# Patient Record
Sex: Female | Born: 1950 | Race: White | Hispanic: No | Smoking: Never smoker
Health system: Southern US, Community
[De-identification: ages and names within clinical notes are randomized; demographics above are authoritative.]

## PROBLEM LIST (undated history)

## (undated) DIAGNOSIS — I10 Essential (primary) hypertension: Secondary | ICD-10-CM

## (undated) DIAGNOSIS — M545 Low back pain, unspecified: Secondary | ICD-10-CM

## (undated) DIAGNOSIS — E119 Type 2 diabetes mellitus without complications: Secondary | ICD-10-CM

## (undated) DIAGNOSIS — I2510 Atherosclerotic heart disease of native coronary artery without angina pectoris: Secondary | ICD-10-CM

## (undated) DIAGNOSIS — K5790 Diverticulosis of intestine, part unspecified, without perforation or abscess without bleeding: Secondary | ICD-10-CM

## (undated) DIAGNOSIS — F329 Major depressive disorder, single episode, unspecified: Secondary | ICD-10-CM

## (undated) DIAGNOSIS — F32A Depression, unspecified: Secondary | ICD-10-CM

## (undated) DIAGNOSIS — K589 Irritable bowel syndrome without diarrhea: Secondary | ICD-10-CM

## (undated) DIAGNOSIS — G8929 Other chronic pain: Secondary | ICD-10-CM

## (undated) DIAGNOSIS — K219 Gastro-esophageal reflux disease without esophagitis: Secondary | ICD-10-CM

## (undated) DIAGNOSIS — E039 Hypothyroidism, unspecified: Secondary | ICD-10-CM

## (undated) DIAGNOSIS — F419 Anxiety disorder, unspecified: Secondary | ICD-10-CM

## (undated) DIAGNOSIS — E785 Hyperlipidemia, unspecified: Secondary | ICD-10-CM

## (undated) DIAGNOSIS — M1990 Unspecified osteoarthritis, unspecified site: Secondary | ICD-10-CM

## (undated) DIAGNOSIS — J45909 Unspecified asthma, uncomplicated: Secondary | ICD-10-CM

## (undated) DIAGNOSIS — R0600 Dyspnea, unspecified: Secondary | ICD-10-CM

## (undated) HISTORY — DX: Gastro-esophageal reflux disease without esophagitis: K21.9

## (undated) HISTORY — DX: Morbid (severe) obesity due to excess calories: E66.01

## (undated) HISTORY — DX: Atherosclerotic heart disease of native coronary artery without angina pectoris: I25.10

## (undated) HISTORY — DX: Hypothyroidism, unspecified: E03.9

## (undated) HISTORY — DX: Unspecified asthma, uncomplicated: J45.909

## (undated) HISTORY — DX: Unspecified osteoarthritis, unspecified site: M19.90

## (undated) HISTORY — PX: CHOLECYSTECTOMY: SHX55

## (undated) HISTORY — PX: CATARACT EXTRACTION: SUR2

## (undated) HISTORY — DX: Irritable bowel syndrome, unspecified: K58.9

## (undated) HISTORY — DX: Major depressive disorder, single episode, unspecified: F32.9

## (undated) HISTORY — DX: Type 2 diabetes mellitus without complications: E11.9

## (undated) HISTORY — DX: Diverticulosis of intestine, part unspecified, without perforation or abscess without bleeding: K57.90

## (undated) HISTORY — DX: Depression, unspecified: F32.A

## (undated) HISTORY — DX: Essential (primary) hypertension: I10

## (undated) HISTORY — DX: Anxiety disorder, unspecified: F41.9

## (undated) HISTORY — DX: Hyperlipidemia, unspecified: E78.5

## (undated) HISTORY — DX: Dyspnea, unspecified: R06.00

---
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
## 1976-09-13 HISTORY — PX: TUBAL LIGATION: SHX77

## 1979-05-15 HISTORY — PX: VAGINAL HYSTERECTOMY: SUR661

## 2000-05-19 ENCOUNTER — Ambulatory Visit: Admission: RE | Admit: 2000-05-19 | Discharge: 2000-05-19 | Payer: Self-pay | Admitting: *Deleted

## 2000-05-20 ENCOUNTER — Ambulatory Visit: Admission: RE | Admit: 2000-05-20 | Discharge: 2000-05-20 | Payer: Self-pay | Admitting: Preventative Medicine

## 2000-05-26 ENCOUNTER — Ambulatory Visit: Admission: RE | Admit: 2000-05-26 | Discharge: 2000-05-26 | Payer: Self-pay | Admitting: Preventative Medicine

## 2001-09-13 HISTORY — PX: CORONARY ARTERY BYPASS GRAFT: SHX141

## 2002-01-22 ENCOUNTER — Encounter: Payer: Self-pay | Admitting: Family Medicine

## 2002-01-22 ENCOUNTER — Inpatient Hospital Stay (HOSPITAL_COMMUNITY): Admission: AD | Admit: 2002-01-22 | Discharge: 2002-01-24 | Payer: Self-pay | Admitting: Family Medicine

## 2002-01-24 ENCOUNTER — Encounter: Payer: Self-pay | Admitting: Cardiology

## 2002-02-16 ENCOUNTER — Ambulatory Visit (HOSPITAL_COMMUNITY): Admission: RE | Admit: 2002-02-16 | Discharge: 2002-02-16 | Payer: Self-pay | Admitting: Family Medicine

## 2002-02-16 ENCOUNTER — Encounter: Payer: Self-pay | Admitting: Family Medicine

## 2002-02-28 ENCOUNTER — Inpatient Hospital Stay (HOSPITAL_COMMUNITY): Admission: AD | Admit: 2002-02-28 | Discharge: 2002-03-09 | Payer: Self-pay | Admitting: Cardiology

## 2002-03-01 ENCOUNTER — Encounter: Payer: Self-pay | Admitting: Thoracic Surgery (Cardiothoracic Vascular Surgery)

## 2002-03-02 ENCOUNTER — Encounter: Payer: Self-pay | Admitting: Thoracic Surgery (Cardiothoracic Vascular Surgery)

## 2002-03-03 ENCOUNTER — Encounter: Payer: Self-pay | Admitting: Thoracic Surgery (Cardiothoracic Vascular Surgery)

## 2002-03-04 ENCOUNTER — Encounter: Payer: Self-pay | Admitting: Thoracic Surgery (Cardiothoracic Vascular Surgery)

## 2002-03-05 ENCOUNTER — Encounter: Payer: Self-pay | Admitting: Thoracic Surgery (Cardiothoracic Vascular Surgery)

## 2002-03-14 ENCOUNTER — Encounter: Payer: Self-pay | Admitting: Cardiology

## 2002-03-14 ENCOUNTER — Ambulatory Visit (HOSPITAL_COMMUNITY): Admission: RE | Admit: 2002-03-14 | Discharge: 2002-03-14 | Payer: Self-pay | Admitting: Cardiology

## 2002-03-29 ENCOUNTER — Encounter (HOSPITAL_COMMUNITY): Admission: RE | Admit: 2002-03-29 | Discharge: 2002-04-11 | Payer: Self-pay | Admitting: Cardiology

## 2002-12-27 ENCOUNTER — Encounter: Payer: Self-pay | Admitting: Family Medicine

## 2002-12-27 ENCOUNTER — Ambulatory Visit (HOSPITAL_COMMUNITY): Admission: RE | Admit: 2002-12-27 | Discharge: 2002-12-27 | Payer: Self-pay | Admitting: Family Medicine

## 2004-07-15 ENCOUNTER — Ambulatory Visit (HOSPITAL_COMMUNITY): Admission: RE | Admit: 2004-07-15 | Discharge: 2004-07-15 | Payer: Self-pay | Admitting: Family Medicine

## 2004-12-11 ENCOUNTER — Ambulatory Visit (HOSPITAL_COMMUNITY): Admission: RE | Admit: 2004-12-11 | Discharge: 2004-12-11 | Payer: Self-pay | Admitting: Family Medicine

## 2006-08-16 ENCOUNTER — Ambulatory Visit: Payer: Self-pay | Admitting: *Deleted

## 2006-08-16 ENCOUNTER — Inpatient Hospital Stay (HOSPITAL_COMMUNITY): Admission: AD | Admit: 2006-08-16 | Discharge: 2006-08-19 | Payer: Self-pay | Admitting: Family Medicine

## 2006-09-20 ENCOUNTER — Ambulatory Visit: Payer: Self-pay | Admitting: Cardiology

## 2007-09-04 ENCOUNTER — Emergency Department (HOSPITAL_COMMUNITY): Admission: EM | Admit: 2007-09-04 | Discharge: 2007-09-04 | Payer: Self-pay | Admitting: Emergency Medicine

## 2008-06-13 HISTORY — PX: ESOPHAGOGASTRODUODENOSCOPY: SHX1529

## 2008-06-13 HISTORY — PX: OTHER SURGICAL HISTORY: SHX169

## 2008-06-14 ENCOUNTER — Ambulatory Visit (HOSPITAL_COMMUNITY): Admission: RE | Admit: 2008-06-14 | Discharge: 2008-06-14 | Payer: Self-pay | Admitting: Family Medicine

## 2008-06-27 ENCOUNTER — Ambulatory Visit: Payer: Self-pay | Admitting: Gastroenterology

## 2008-07-03 ENCOUNTER — Ambulatory Visit (HOSPITAL_COMMUNITY): Admission: RE | Admit: 2008-07-03 | Discharge: 2008-07-03 | Payer: Self-pay | Admitting: Gastroenterology

## 2008-07-03 ENCOUNTER — Encounter: Payer: Self-pay | Admitting: Gastroenterology

## 2008-07-03 ENCOUNTER — Ambulatory Visit: Payer: Self-pay | Admitting: Gastroenterology

## 2008-10-11 ENCOUNTER — Emergency Department (HOSPITAL_COMMUNITY): Admission: EM | Admit: 2008-10-11 | Discharge: 2008-10-11 | Payer: Self-pay | Admitting: Emergency Medicine

## 2008-12-09 ENCOUNTER — Emergency Department (HOSPITAL_COMMUNITY): Admission: EM | Admit: 2008-12-09 | Discharge: 2008-12-09 | Payer: Self-pay | Admitting: Emergency Medicine

## 2010-03-22 ENCOUNTER — Emergency Department (HOSPITAL_COMMUNITY)
Admission: EM | Admit: 2010-03-22 | Discharge: 2010-03-22 | Payer: Self-pay | Source: Home / Self Care | Admitting: Emergency Medicine

## 2010-08-25 ENCOUNTER — Ambulatory Visit (HOSPITAL_COMMUNITY)
Admission: RE | Admit: 2010-08-25 | Discharge: 2010-08-25 | Payer: Self-pay | Source: Home / Self Care | Attending: Ophthalmology | Admitting: Ophthalmology

## 2010-10-04 ENCOUNTER — Encounter: Payer: Self-pay | Admitting: Family Medicine

## 2010-11-23 ENCOUNTER — Observation Stay (HOSPITAL_COMMUNITY)
Admission: EM | Admit: 2010-11-23 | Discharge: 2010-11-26 | Disposition: A | Discharge status: Home / Self Care | Payer: BC Managed Care – PPO | Attending: Internal Medicine | Admitting: Internal Medicine

## 2010-11-23 ENCOUNTER — Emergency Department (HOSPITAL_COMMUNITY): Payer: BC Managed Care – PPO

## 2010-11-23 DIAGNOSIS — IMO0001 Reserved for inherently not codable concepts without codable children: Secondary | ICD-10-CM | POA: Insufficient documentation

## 2010-11-23 DIAGNOSIS — E871 Hypo-osmolality and hyponatremia: Secondary | ICD-10-CM | POA: Insufficient documentation

## 2010-11-23 DIAGNOSIS — D649 Anemia, unspecified: Secondary | ICD-10-CM | POA: Insufficient documentation

## 2010-11-23 DIAGNOSIS — E039 Hypothyroidism, unspecified: Secondary | ICD-10-CM | POA: Insufficient documentation

## 2010-11-23 DIAGNOSIS — J45901 Unspecified asthma with (acute) exacerbation: Principal | ICD-10-CM | POA: Insufficient documentation

## 2010-11-23 DIAGNOSIS — Z794 Long term (current) use of insulin: Secondary | ICD-10-CM | POA: Insufficient documentation

## 2010-11-23 DIAGNOSIS — Z79899 Other long term (current) drug therapy: Secondary | ICD-10-CM | POA: Insufficient documentation

## 2010-11-23 DIAGNOSIS — I2510 Atherosclerotic heart disease of native coronary artery without angina pectoris: Secondary | ICD-10-CM | POA: Insufficient documentation

## 2010-11-23 LAB — DIFFERENTIAL
Basophils Absolute: 0 10*3/uL (ref 0.0–0.1)
Basophils Relative: 1 % (ref 0–1)
Eosinophils Absolute: 0.2 10*3/uL (ref 0.0–0.7)
Eosinophils Relative: 3 % (ref 0–5)
Lymphocytes Relative: 35 % (ref 12–46)
Lymphs Abs: 2 10*3/uL (ref 0.7–4.0)
Monocytes Absolute: 0.7 10*3/uL (ref 0.1–1.0)
Monocytes Relative: 13 % — ABNORMAL HIGH (ref 3–12)
Neutro Abs: 2.7 10*3/uL (ref 1.7–7.7)
Neutrophils Relative %: 48 % (ref 43–77)

## 2010-11-23 LAB — BASIC METABOLIC PANEL
BUN: 11 mg/dL (ref 6–23)
CO2: 25 mEq/L (ref 19–32)
Calcium: 9.3 mg/dL (ref 8.4–10.5)
Chloride: 87 mEq/L — ABNORMAL LOW (ref 96–112)
Creatinine, Ser: 1.1 mg/dL (ref 0.4–1.2)
GFR calc Af Amer: >60 mL/min (ref >60)
GFR calc non Af Amer: 51 mL/min — ABNORMAL LOW (ref >60)
Glucose, Bld: 333 mg/dL — ABNORMAL HIGH (ref 70–99)
Potassium: 4.1 mEq/L (ref 3.5–5.1)
Sodium: 128 mEq/L — ABNORMAL LOW (ref 135–145)

## 2010-11-23 LAB — URINALYSIS, ROUTINE W REFLEX MICROSCOPIC
Bilirubin Urine: NEGATIVE
Glucose, UA: NEGATIVE mg/dL
Hgb urine dipstick: NEGATIVE
Ketones, ur: NEGATIVE mg/dL
Nitrite: NEGATIVE
Protein, ur: NEGATIVE mg/dL
Specific Gravity, Urine: 1.01 (ref 1.005–1.030)
Urobilinogen, UA: 0.2 mg/dL (ref 0.0–1.0)
pH: 5 (ref 5.0–8.0)

## 2010-11-23 LAB — CK TOTAL AND CKMB (NOT AT ARMC)
CK, MB: 0.9 ng/mL (ref 0.3–4.0)
Relative Index: INVALID (ref 0.0–2.5)
Total CK: 67 U/L (ref 7–177)

## 2010-11-23 LAB — CBC
HCT: 39.7 % (ref 36.0–46.0)
Hemoglobin: 13.3 g/dL (ref 12.0–15.0)
MCH: 29.6 pg (ref 26.0–34.0)
MCHC: 33.5 g/dL (ref 30.0–36.0)
MCV: 88.4 fL (ref 78.0–100.0)
Platelets: 216 10*3/uL (ref 150–400)
RBC: 4.49 MIL/uL (ref 3.87–5.11)
RDW: 12.8 % (ref 11.5–15.5)
WBC: 5.7 10*3/uL (ref 4.0–10.5)

## 2010-11-23 LAB — POCT I-STAT 4, (NA,K, GLUC, HGB,HCT)
Glucose, Bld: 64 mg/dL — ABNORMAL LOW (ref 70–99)
HCT: 41 % (ref 36.0–46.0)
Hemoglobin: 13.9 g/dL (ref 12.0–15.0)
Potassium: 4.4 mEq/L (ref 3.5–5.1)
Sodium: 138 mEq/L (ref 135–145)

## 2010-11-23 LAB — GLUCOSE, CAPILLARY
Glucose-Capillary: 279 mg/dL — ABNORMAL HIGH (ref 70–99)
Glucose-Capillary: 150 mg/dL — ABNORMAL HIGH (ref 70–99)
Glucose-Capillary: 118 mg/dL — ABNORMAL HIGH (ref 70–99)

## 2010-11-23 LAB — TROPONIN I: Troponin I: 0.01 ng/mL (ref 0.00–0.06)

## 2010-11-24 LAB — GLUCOSE, CAPILLARY
Glucose-Capillary: 226 mg/dL — ABNORMAL HIGH (ref 70–99)
Glucose-Capillary: 388 mg/dL — ABNORMAL HIGH (ref 70–99)
Glucose-Capillary: 389 mg/dL — ABNORMAL HIGH (ref 70–99)
Glucose-Capillary: 424 mg/dL — ABNORMAL HIGH (ref 70–99)
Glucose-Capillary: 475 mg/dL — ABNORMAL HIGH (ref 70–99)
Glucose-Capillary: 496 mg/dL — ABNORMAL HIGH (ref 70–99)

## 2010-11-24 LAB — TSH: TSH: 1.301 u[IU]/mL (ref 0.350–4.500)

## 2010-11-24 LAB — GLUCOSE, RANDOM: Glucose, Bld: 397 mg/dL — ABNORMAL HIGH (ref 70–99)

## 2010-11-24 LAB — COMPREHENSIVE METABOLIC PANEL
AST: 21 U/L (ref 0–37)
Albumin: 3 g/dL — ABNORMAL LOW (ref 3.5–5.2)
Alkaline Phosphatase: 60 U/L (ref 39–117)
BUN: 15 mg/dL (ref 6–23)
Chloride: 96 mEq/L (ref 96–112)
GFR calc Af Amer: >60 mL/min (ref >60)
Potassium: 3.9 mEq/L (ref 3.5–5.1)
Total Protein: 6.4 g/dL (ref 6.0–8.3)

## 2010-11-24 LAB — CARDIAC PANEL(CRET KIN+CKTOT+MB+TROPI)
Relative Index: INVALID (ref 0.0–2.5)
Relative Index: INVALID (ref 0.0–2.5)
Total CK: 57 U/L (ref 7–177)

## 2010-11-25 LAB — DIFFERENTIAL
Eosinophils Absolute: 0 10*3/uL (ref 0.0–0.7)
Lymphocytes Relative: 13 % (ref 12–46)
Lymphs Abs: 1.5 10*3/uL (ref 0.7–4.0)
Neutro Abs: 9.6 10*3/uL — ABNORMAL HIGH (ref 1.7–7.7)
Neutrophils Relative %: 82 % — ABNORMAL HIGH (ref 43–77)

## 2010-11-25 LAB — GLUCOSE, CAPILLARY: Glucose-Capillary: 329 mg/dL — ABNORMAL HIGH (ref 70–99)

## 2010-11-25 LAB — CBC
HCT: 35.8 % — ABNORMAL LOW (ref 36.0–46.0)
Hemoglobin: 11.9 g/dL — ABNORMAL LOW (ref 12.0–15.0)
MCV: 89.1 fL (ref 78.0–100.0)
Platelets: 216 10*3/uL (ref 150–400)
RBC: 4.02 MIL/uL (ref 3.87–5.11)
WBC: 11.7 10*3/uL — ABNORMAL HIGH (ref 4.0–10.5)

## 2010-11-25 LAB — BASIC METABOLIC PANEL
GFR calc non Af Amer: >60 mL/min (ref >60)
Potassium: 4.4 mEq/L (ref 3.5–5.1)
Sodium: 139 mEq/L (ref 135–145)

## 2010-11-26 LAB — GLUCOSE, CAPILLARY
Glucose-Capillary: 271 mg/dL — ABNORMAL HIGH (ref 70–99)
Glucose-Capillary: 263 mg/dL — ABNORMAL HIGH (ref 70–99)

## 2010-11-26 LAB — BASIC METABOLIC PANEL
CO2: 24 mEq/L (ref 19–32)
Calcium: 9.4 mg/dL (ref 8.4–10.5)
Creatinine, Ser: 0.78 mg/dL (ref 0.4–1.2)
GFR calc Af Amer: >60 mL/min (ref >60)
Glucose, Bld: 229 mg/dL — ABNORMAL HIGH (ref 70–99)

## 2010-11-26 LAB — DIFFERENTIAL
Basophils Absolute: 0 10*3/uL (ref 0.0–0.1)
Basophils Relative: 0 % (ref 0–1)
Eosinophils Absolute: 0 10*3/uL (ref 0.0–0.7)
Eosinophils Relative: 0 % (ref 0–5)
Monocytes Absolute: 0.3 10*3/uL (ref 0.1–1.0)
Neutro Abs: 8.7 10*3/uL — ABNORMAL HIGH (ref 1.7–7.7)

## 2010-11-26 LAB — CBC
MCHC: 33.8 g/dL (ref 30.0–36.0)
Platelets: 216 10*3/uL (ref 150–400)
RDW: 12.7 % (ref 11.5–15.5)

## 2010-11-28 LAB — CULTURE, BLOOD (ROUTINE X 2)

## 2010-11-30 NOTE — Discharge Summary (Signed)
NAME:  [name], [name]               ACCOUNT NO.:  [id number]  MEDICAL RECORD NO.:  [id number]           PATIENT TYPE:  O  LOCATION:  A318                          FACILITY:  [facility]  PHYSICIAN:  Elliot Cousin, M.D.    DATE OF BIRTH:  [birth year]  DATE OF ADMISSION:  11/23/2010 DATE OF DISCHARGE:  03/15/2012LH                              DISCHARGE SUMMARY   DISCHARGE DIAGNOSES: 1. Acute asthmatic bronchitis. 2. Uncontrolled type 2 diabetes mellitus.  The patient's hemoglobin     A1c was 12.1. 3. Hypothyroidism.  The patient's free T4 was within normal limits at     1.21 and her TSH was within normal limits at 1.301. 4. Hyponatremia secondary to hyperglycemia, resolved. 5. Coronary artery disease.  The patient's cardiac enzymes were within     normal limits. 6. Mild dilutional anemia.  The patient's hemoglobin on admission was     13.3 and at the time of hospital discharge, was 11.2.  Further     monitoring and evaluation will be deferred to her primary care     physician.  DISCHARGE MEDICATIONS: 1. Advair Diskus 250/50 one puff b.i.d. 2. Tessalon Perles 1 capsule 3 times daily. 3. Guaifenesin DM 5 mL every 4 hours as needed for cough. 4. Prednisone taper to be taken as directed over the next 5 days. 5. ProAir inhaler 2 puffs 3 times daily scheduled for 4-5 days and     then 3 times daily as needed. 6. Z-Pak to be taken as directed over the next 5 days. 7. Humalog sliding scale 3 times daily.  The patient was given the     instructions of the sliding scale as written for her. 8. Lantus insulin, the dose was increased to 25 units subcu b.i.d. 9. Alprazolam 0.5 mg daily as needed for anxiety. 10.Aspirin 325 mg daily. 11.Glyburide 5 mg b.i.d. 12.Levoxyl 112 mcg daily. 13.Lisinopril 20 mg daily. 14.Metformin 500 mg b.i.d. 15.Vytorin 10/80 mg daily.  DISCHARGE DISPOSITION:  The patient was discharged home in improved and stable condition on November 26, 2010.  She will follow up with  her primary care physician, Dr. Gerda Diss, on Wednesday, December 02, 2010, at 10 o'clock a.m.  CONSULTATIONS:  None.  PROCEDURES PERFORMED:  Chest x-ray on November 23, 2010.  The results revealed no active or acute disease.  HISTORY OF PRESENT ILLNESS:  The patient is a 60 year old woman with a past medical history significant for type 2 diabetes mellitus, morbid obesity, asthma, and coronary artery disease.  She presented to the hospital with a chief complaint of wheezing and cough.  She also complained of elevated and uncontrolled blood sugars at home.  In the emergency department, she was noted to be mildly hypotensive with a blood pressure of 92/51.  Her temperature was 99.1 and her oxygen saturation was 96% on nasal cannula oxygen.  Her chest x-ray revealed no acute cardiopulmonary disease.  Her EKG revealed normal sinus rhythm with nonspecific ST and T-wave abnormalities.  Her lab data were significant for a normal WBC of 5.7, low serum sodium of 128, and elevated blood glucose of 333.  She was admitted  for further evaluation and management.  HOSPITAL COURSE: The patient was started empirically on azithromycin intravenously for what was considered an infectious acute asthmatic bronchitis. Albuterol and Atrovent nebulizers were administered every 6 hours and then every 2 hours as needed.  125 mg of Solu-Medrol was given intravenously once and then she was started on a slow prednisone taper. Oxygen was titrated to keep her oxygen saturations greater than 90%. Her cough was treated with both Robitussin DM and Tessalon Perles.  She was restarted on all of her home medications including glyburide and metformin.  Lantus insulin was titrated up to 25 units subcu twice daily.  Sliding scale NovoLog was titrated up to the resistant scale. Also, she received additional NovoLog for mealtime coverage if she ate 50% or more of her meals, which she did.  Over the course of the hospitalization,  the patient's symptoms subsided. At the time of hospital discharge, she was oxygenating 98% on room air. Although her capillary blood glucose was not optimal, it did improve. Prior to hospital discharge, her capillary blood glucose was ranging from 200 to 250.  Her hemoglobin A1c was noted to be 12.1, indicating poor control.  She had no complaints of chest pain during the hospitalization.  All of her cardiac enzymes were well within normal limits.  She was maintained on Levoxyl.  Her TSH and free T4 were therapeutic.  Prior to hospital discharge, the patient was advised to continue Lantus at 25 units twice daily.  She had previously been treated with 20 units once daily.  Also,  new sliding scale instructions were explained and given to the patient for t.i.d. dosing of Humalog.  She voiced understanding.     Elliot Cousin, M.D.     DF/MEDQ  D:  11/26/2010  T:  11/26/2010  Job:  [id number]  cc:   Donna Bernard, M.D. Fax: [phone]  Electronically Signed by Elliot Cousin M.D. on 11/30/2010 09:17:52 AM

## 2010-12-06 NOTE — H&P (Signed)
NAME:  [name], [name]               ACCOUNT NO.:  [id number]  MEDICAL RECORD NO.:  [id number]           PATIENT TYPE:  I  LOCATION:  A318                          FACILITY:  [facility]  PHYSICIAN:  Tarry Kos, MD       DATE OF BIRTH:  05-06-1951  DATE OF ADMISSION:  11/23/2010 DATE OF DISCHARGE:  LH                             HISTORY & PHYSICAL   CHIEF COMPLAINT:  Wheezing and cough.  HISTORY OF PRESENT ILLNESS:  [name] is a 60 year old female who has a history of insulin-dependent diabetes, morbid obesity, asthma, coronary artery disease, status post CABG who presents to the emergency department from her primary care physician's office Dr. Gerda Diss because of uncontrolled diabetes and the need to start steroids.  Apparently, [name] approximately 3 weeks ago presented to the ED in [place] and was treated with a course of Levaquin.  She was not given any bronchodilators at that time.  She completed a course of Levaquin, and she stated she has not gotten over this cough and wheezing that she has had.  She is not aware of her diagnosis of asthma, however, in her past medical history here she was hospitalized here in 2008 with a diagnosis of asthma at that time.  She says every time she gets sick, her sugars go out of control.  Her sugar here in the emergency department is over 400, and she has not recently been on any steroids.  She says she did have a temperature 2-3 days ago over 100.  She denies any chest pain. Denies any lower extremity edema.  We have been asked to admit the patient to monitor her sugars while starting steroids.  She has not been given any insulin or steroids yet in the emergency department.  Review of system is otherwise negative.  PAST MEDICAL HISTORY: 1. Asthma again with hospitalization in January 2008. 2. She has significant history of coronary artery disease status post     CABG in 2003. 3. Insulin-dependent diabetes that is uncontrolled. 4. Morbid  obesity. 5. Hyperlipidemia. 6. Hypothyroidism. 7. Diverticulosis. 8. Status post hysterectomy, status post shoulder surgery, status post     cholecystectomy.  FAMILY HISTORY:  Nonsignificant.  SOCIAL HISTORY:  She is denies tobacco use and no alcohol.  No IV drug abuse.  She denies asthma as a child and again is not aware that she has a diagnosis of asthma, but she said says she has wheezed off and on many times throughout her adult life.  MEDICATIONS:  Lantus 20 units subcu daily, sliding scale insulin with each meal, metformin 500 mg twice a day, glyburide 5 mg twice a day, simvastatin 20 mg daily, aspirin 325 mg daily, alprazolam orally.  PHYSICAL EXAMINATION:  VITAL SIGNS/GENERAL:  Her temperature is 99.1 in the emergency department, respiratory rate is 24, pulse 101, and blood pressure 92/51.  She is alert and oriented in no apparent distress.  Her O2 sat initially 97% on room air.  She currently 97% on 2 liters nasal cannula. HEENT:  Extraocular movements are intact.  Pupils equal, reactive to light.  Oropharynx clear.  Mucous membranes  moist. NECK:  No JVD.  No carotid bruits. HEART:  Regular rate and rhythm without murmurs or gallops. CHEST:  She has gotten some mild expiratory wheezing bilaterally.  No rhonchi, rales, or crackles. ABDOMEN:  Soft, nontender, nondistended.  Positive bowel sounds.  No hepatosplenomegaly.  EXTREMITIES:  No clubbing, cyanosis, or edema. PSYCHIATRIC:  Normal affect.  No focal neurologic deficits. SKIN:  No rashes.  LABORATORY DATA:  Chest x-ray is negative for pneumonia.  White count is normal.  Hemoglobin is normal.  Glucose is 337.  BMP sodium is low at 128, creatinine is 1.1.  Urinalysis is negative.  ASSESSMENT AND PLAN:  This is a 60 year old female with acute asthma exacerbation. 1. Acute asthma exacerbation.  This is likely viral.  I am going to     cover her with azithromycin, but if she does spike a temperature, I     would  broaden her antibiotics, obtain blood cultures for now, place     her on Solu-Medrol 125 mg IV once and then switch her prednisone 30     mg twice a day with food.  Her chest x-ray is negative for any     infiltrate. 2. Mild dehydration.  Placed her on IV fluids overnight. 3. Insulin dependent diabetes which is currently uncontrolled.     Continue her on Lantus and put her on a sliding scale insulin and     adjust that as needed, try to quickly taper her off of prednisone     if we can. 4. History of coronary artery disease, status post CABG we are going     to serial her cardiac enzymes. 5. The patient is full code.  Further recommendations depending on     overall hospital course.                                           ______________________________ Tarry Kos, MD     RD/MEDQ  D:  11/23/2010  T:  11/24/2010  Job:  [id number]  Electronically Signed by Eldridge Dace MD on 12/06/2010 01:49:43 PM

## 2010-12-24 LAB — URINALYSIS, ROUTINE W REFLEX MICROSCOPIC
Glucose, UA: 500 mg/dL — AB
Hgb urine dipstick: NEGATIVE
Ketones, ur: NEGATIVE mg/dL
Protein, ur: NEGATIVE mg/dL

## 2010-12-28 LAB — CBC
Hemoglobin: 13.4 g/dL (ref 12.0–15.0)
MCHC: 33.9 g/dL (ref 30.0–36.0)
MCV: 89.6 fL (ref 78.0–100.0)
RBC: 4.39 MIL/uL (ref 3.87–5.11)
WBC: 9.4 10*3/uL (ref 4.0–10.5)

## 2010-12-28 LAB — BASIC METABOLIC PANEL
CO2: 28 mEq/L (ref 19–32)
Calcium: 9.8 mg/dL (ref 8.4–10.5)
Chloride: 99 mEq/L (ref 96–112)
GFR calc Af Amer: >60 mL/min (ref >60)
Sodium: 138 mEq/L (ref 135–145)

## 2010-12-28 LAB — DIFFERENTIAL
Basophils Relative: 0 % (ref 0–1)
Lymphs Abs: 2 10*3/uL (ref 0.7–4.0)
Monocytes Absolute: 0.5 10*3/uL (ref 0.1–1.0)
Monocytes Relative: 5 % (ref 3–12)
Neutro Abs: 6.3 10*3/uL (ref 1.7–7.7)

## 2010-12-28 LAB — GLUCOSE, CAPILLARY

## 2010-12-31 ENCOUNTER — Emergency Department (HOSPITAL_COMMUNITY): Payer: BC Managed Care – PPO

## 2010-12-31 ENCOUNTER — Emergency Department (HOSPITAL_COMMUNITY)
Admission: EM | Admit: 2010-12-31 | Discharge: 2010-12-31 | Disposition: A | Discharge status: Home / Self Care | Payer: BC Managed Care – PPO | Attending: Emergency Medicine | Admitting: Emergency Medicine

## 2010-12-31 DIAGNOSIS — E7800 Pure hypercholesterolemia, unspecified: Secondary | ICD-10-CM | POA: Insufficient documentation

## 2010-12-31 DIAGNOSIS — Z794 Long term (current) use of insulin: Secondary | ICD-10-CM | POA: Insufficient documentation

## 2010-12-31 DIAGNOSIS — Z79899 Other long term (current) drug therapy: Secondary | ICD-10-CM | POA: Insufficient documentation

## 2010-12-31 DIAGNOSIS — IMO0002 Reserved for concepts with insufficient information to code with codable children: Secondary | ICD-10-CM | POA: Insufficient documentation

## 2010-12-31 DIAGNOSIS — I10 Essential (primary) hypertension: Secondary | ICD-10-CM | POA: Insufficient documentation

## 2010-12-31 DIAGNOSIS — I252 Old myocardial infarction: Secondary | ICD-10-CM | POA: Insufficient documentation

## 2010-12-31 DIAGNOSIS — Y998 Other external cause status: Secondary | ICD-10-CM | POA: Insufficient documentation

## 2010-12-31 DIAGNOSIS — Z951 Presence of aortocoronary bypass graft: Secondary | ICD-10-CM | POA: Insufficient documentation

## 2010-12-31 DIAGNOSIS — E119 Type 2 diabetes mellitus without complications: Secondary | ICD-10-CM | POA: Insufficient documentation

## 2011-01-26 NOTE — Consult Note (Signed)
NAME:  [name], [name]               ACCOUNT NO.:  [id number]   MEDICAL RECORD NO.:  [id number]          PATIENT TYPE:  AMB   LOCATION:  DAY                           FACILITY:  [facility]   PHYSICIAN:  Kassie Mends, M.D.      DATE OF BIRTH:  [birth year]   DATE OF CONSULTATION:  06/27/2008  DATE OF DISCHARGE:                                 CONSULTATION   REFERRING PHYSICIAN:  Donna Bernard, MD   REASON FOR CONSULTATION:  Abdominal pain, diarrhea.   HISTORY OF PRESENT ILLNESS:  The patient is a pleasant 60 year old  morbidly obese Caucasian female who presents today for further  evaluation of the above-stated symptoms at the request of Dr. Gerda Diss.  She states that she has had diarrhea for over 1-year duration.  It used  to be more sporadic.  For the past several months, she has had it on a  regular basis, and in the last 2 weeks, it has been severe.  Currently,  she is having more than 5, but less than 10 stools a day.  She has  postprandial stools almost immediately.  She complains of nocturnal  diarrhea as well.  She never has solid stools.  She denies any blood in  the stool or melena.  She has a lot of nausea associated with this.  She  also has postprandial nausea without vomiting.  Denies any weight loss.  No abdominal pain outside of cramping with urge for bowel movement.  Denies any dysphagia, odynophagia, or indigestion.  She has never had a  colonoscopy or EGD.  She denies any history of IBS.  She had a CT of  abdomen and pelvis on June 14, 2008, which revealed a 3- x 2-cm  rounded filling defect in the fundal region of the stomach, felt to be a  bezoar versus a large gastric polyp or other mass.  She had mild  diverticulosis of the sigmoid colon.  Status post hysterectomy.  Bilateral hip joint degenerative changes and asymmetric SI joint  degenerative changes on the right side.   LABORATORY DATA:  Include BUN and creatinine normal.  TSH normal.  LFTs  normal.  She reports  negative stool studies, though we do not have those  records.   CURRENT MEDICATIONS:  1. Vytorin 10/80 mg daily.  2. Lasix 80 mg daily.  3. Lisinopril 10 mg daily.  4. Synthroid 0.1 mg daily.  5. Lantus 30 units b.i.d.  6. Lomotil 6-10 a day.  7. Travatan eye drops nightly.  8. Metformin 500 mg b.i.d.  9. Klor-Con 20 mEq daily.  10.Glyburide 5 mg b.i.d.  11.Metoprolol ER 25 mg daily.  12.Humalog sliding scale daily.  13.Xanax 0.5 mg p.r.n.  14.Aspirin 325 mg daily.   ALLERGIES:  No known drug allergies.   PAST MEDICAL HISTORY:  Diabetes mellitus, hypothyroidism, glaucoma,  hypertension, hypercholesterolemia, depression, anxiety, CAD status post  5-vessel bypass in 2003.  She has had a partial hysterectomy and  cholecystectomy in the 1990s, and bilateral tubal ligation in 1978.   FAMILY HISTORY:  Mother deceased at age 30 with cancer of  unknown  primary, possibly pancreatic, but she is not sure.  Father died in the  5s, had diabetes mellitus and possible MI.   SOCIAL HISTORY:  She is married, has 2 children.  She is employed with  [employer] in [place].  She has never been a smoker.  No alcohol use.   REVIEW OF SYSTEMS:  GI:  See HPI.  CONSTITUTIONAL:  No weight loss.  CARDIOPULMONARY:  No chest pain, shortness of breath, palpitations, or  cough.  GENITOURINARY:  No dysuria or hematuria.   PHYSICAL EXAMINATION:  VITAL SIGNS:  Weight 290 pounds, height 5 feet 4-  1/2 inches, temp 97.8, blood pressure 102/70, and pulse 64.  GENERAL:  Pleasant and morbidly obese Caucasian female in no acute  distress.  SKIN:  Warm and dry.  No jaundice.  HEENT:  Sclera nonicteric.  Oropharyngeal mucosa moist and pink.  No  lymphadenopathy or thyromegaly.  CHEST:  Lungs are clear to auscultation.  CARDIAC:  Regular rate and rhythm.  Normal S1 and S2.  No murmurs, rubs,  or gallops.  ABDOMEN:  Positive bowel sounds.  Abdomen is massively obese, soft, and  nontender.  No  organomegaly or masses appreciated, but limited to body  habitus.  No abdominal bruits or hernias.  LOWER EXTREMITIES:  No edema.   IMPRESSION:  [name] is a 60 year old lady with a 1-year history of  chronic diarrhea, which has been more severe over the last several  weeks.  This has debilitated her.  She has been out of work.  She is  having nocturnal symptoms as well.  She is a diabetic.  Her sugars have  been out of control with a hemoglobin A1c of over 9.  Not mentioned  above, she has no ill contacts, no travel abroad.  She does not notice  any particular food that worsen her symptoms.  Differential diagnoses at  this point in time include microscopic colitis, celiac disease, IBS, and  IBD.  She also has abnormal CT with either a bezoar or a large gastric  polyp versus mass in the stomach.  She needs to have a fundoscopy as  well.  Recommended esophagogastroduodenoscopy with possible small bowel  biopsy for celiac disease and colonoscopy plus or minus random biopsies  based on findings.  I have discussed the risks, alternatives, and  benefits with regards to, but not limited to the risks of reaction,  medication, bleeding, infection, and perforation, and she is agreeable  to proceed.   PLAN:  1. Colonoscopy with possible random biopsies, EGD plus or minus small      bowel biopsies with Dr. Cira Servant in the near future.  2. CBC and celiac antibody panel #3.  We will keep her out of work      until after the procedure.  Out-of-work note provided.  3. I would like to thank Dr. Simone Curia for allowing me to take      part in the care of this patient.      Tana Coast, P.A.      Kassie Mends, M.D.  Electronically Signed    LL/MEDQ  D:  06/27/2008  T:  06/28/2008  Job:  [id number]   cc:   Kassie Mends, M.D.  [address]  [place] , [place] [address]   W. Simone Curia, M.D.  Fax: [phone]

## 2011-01-26 NOTE — Op Note (Signed)
NAME:  [name], [name]               ACCOUNT NO.:  [id number]   MEDICAL RECORD NO.:  [id number]          PATIENT TYPE:  AMB   LOCATION:  DAY                           FACILITY:  [facility]   PHYSICIAN:  Kassie Mends, M.D.      DATE OF BIRTH:  1951/06/25   DATE OF PROCEDURE:  07/03/2008  DATE OF DISCHARGE:                               OPERATIVE REPORT   REFERRING PHYSICIAN:  Donna Bernard, MD   PROCEDURE:  1. Ileocolonoscopy with random cold forceps biopsy.  2. Esophagogastroduodenoscopy with cold forceps biopsy of the gastric      and the duodenal mucosa.   INDICATION FOR EXAM:  [name] is a 60 year old female who has had  diarrhea for over a year.  She reports it was off and on, but over the  past several month, it has been on regular basis.  She denies any  abdominal pain.  She has a significant past medical history of  cholecystectomy and diabetes as well as hypothyroidism.   FINDINGS:  1. Normal terminal ileum, approximately 5-cm visualized.  2. Rare sigmoid colon diverticula.  Otherwise, no polyps, masses,      inflammatory changes, or AVMs seen.  Random biopsies were obtained      via cold forceps to evaluate for microscopic colitis.  3. Normal retroflexed view of the rectum.  4. Normal esophagus without evidence of Barrett mass, erosion,      ulceration, or stricture.  5. Mild hypertrophy of the gastric mucosa seen in the body with mild      erythema seen in the antrum.  Biopsies were obtained via cold      forceps to evaluate for H. pylori gastritis.  A 6-mm x 1-cm gastric      nodules seen in the mid body.  It was pillow texture to it.      Biopsies were obtained.  Tunnel biopsies were obtained via cold      forceps.  6. Normal duodenal bulb.  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  One additional duodenal diverticula was      appreciated in the second portion of the duodenum.  Biopsies were      obtained via cold forceps to evaluate  for celiac sprue.   DIAGNOSES:  1. No obvious source for [name] diarrhea identified.  The      differential diagnosis includes irritable bowel syndrome diarrhea      predominant, celiac sprue, microscopic colitis, bile salt-induced      diarrhea, small bowel bacterial overgrowth, diabetic enteropathy,      and a low likelihood of diarrhea secondary to metformin or Klor-      Con.   RECOMMENDATIONS:  1. No aspirin until July 11, 2008.  No NSAIDs or anticoagulation      for 7 days.  2. Will call her with results of her biopsies.  3. She should follow high-fiber, low-fat, lactose-free diabetic diet.      She is given a handout on a low-fat, lactose-free high-fiber diet  as well as diverticulosis and gastritis.  4. Screening colonoscopy in 10 years.   MEDICATIONS:  1. Demerol 100 mg IV.  2. Versed 7 mg IV.   PROCEDURE TECHNIQUE:  Physical exam was performed.  Informed consent was  obtained.  The patient was explained the benefits, risks, and  alternatives to the procedure.  The patient was connected to the monitor  and placed in left lateral position.  Continuous oxygen was provided by  nasal cannula, IV medicine administered through an indwelling cannula.  After administration of sedation and rectal exam, the patient's rectum  was intubated and the scope was advanced under direct visualization to  the distal terminal ileum.  The scope was removed slowly by carefully  examining the color, texture, anatomy, and integrity mucosa on the way  out.   After the colonoscopy, the patient's esophagus was intubated with  diagnostic gastroscope.  The scope was advanced under direct  visualization to the second portion of the duodenum.  The scope was  removed slowly by carefully examining the color, texture, anatomy, and  integrity mucosa on the way out.  The patient was recovered in endoscopy  and discharged home in satisfactory condition.   PATH:  Benign colon. Chronic  gastritis.n Nl duodenum.      Kassie Mends, M.D.  Electronically Signed     SM/MEDQ  D:  07/03/2008  T:  07/03/2008  Job:  [id number]   cc:   Donna Bernard, M.D.  Fax: [phone]

## 2011-01-29 NOTE — Procedures (Signed)
NAME:  [name], [name]               ACCOUNT NO.:  [id number]   MEDICAL RECORD NO.:  [id number]          PATIENT TYPE:  INP   LOCATION:  A214                          FACILITY:  [facility]   PHYSICIAN:  Edward L. Juanetta Gosling, M.D.DATE OF BIRTH:  Dec 19, 1950   DATE OF PROCEDURE:  08/16/2006  DATE OF DISCHARGE:                              EKG INTERPRETATION   The rhythm is sinus rhythm with a rate in the 80s.  ST-T wave changes  are seen which are diffuse but nonspecific but could indicate ischemia  and clinical correlation is suggested.  Abnormal electrocardiogram.      Oneal Deputy. Juanetta Gosling, M.D.  Electronically Signed     ELH/MEDQ  D:  08/17/2006  T:  08/18/2006  Job:  [id number]

## 2011-01-29 NOTE — Consult Note (Signed)
NAME:  [name], [name]               ACCOUNT NO.:  [id number]   MEDICAL RECORD NO.:  [id number]          PATIENT TYPE:  INP   LOCATION:  A214                          FACILITY:  [facility]   PHYSICIAN:  E. Graceann Congress, MD, FACCDATE OF BIRTH:  03/28/1951   DATE OF CONSULTATION:  08/16/2006  DATE OF DISCHARGE:                                 CONSULTATION   The patient is a very pleasant 60 year old obese white married female, 4  yours post coronary artery bypass graft surgery by Dr. Tressie Stalker,  now presenting with a several-day history of shortness of breath, cough,  and chest discomfort.  The patient has a history of diabetes,  hyperlipidemia.   At the time of catheterization in 2003, she was found to have 3-vessel  coronary artery disease, and CABG included anastomosis of the left  internal mammary artery to the distal anterior descending, saphenous  vein graft to the second diagonal branch, saphenous vein graft to the  first diagonal branch with sequential grafting to the circumflex  marginal and saphenous vein graft to the posterior descending.  The  patient got along well and had no recurrent symptoms since that time and  works full time.   She states that over the weekend she noticed some pressure-type  symptoms, shortness of breath, minimal cough.   She was hospitalized yesterday by Dr. Gerda Diss and is much improved today  with no recurrent symptoms. Serial enzymes were negative.  She was given  some IV steroids and Levaquin and feels fine this morning.  On  admission, she did have bilateral expiratory wheezes and tachypnea.   MEDICATIONS ON ADMISSION:  1. Glyburide 10 mg b.i.d.  2. Zocor 80 mg.  3. Temazepam 15 mg nightly p.r.n.  4. Glucophage 1 g daily.  5. Toprol XL 25 mg daily.  6. Aspirin 81 mg.  7. Lasix 40 mg, recently increased to 80.  8. KCl 20.   PAST SURGICAL HISTORY:  1. Remote shoulder surgery.  2. Hysterectomy.  3. Cholecystectomy.  4. CABG.   PAST  MEDICAL HISTORY:  1. Diverticulosis.  2. Hyperlipidemia.  3. Hypothyroidism.  4. Diabetes.  5. The patient has had some chronic low back pain.  6. GERD.   FAMILY HISTORY:  Should note that her husband had a heart attack in 1985  and flown [place] [facility] [place] and treated emergently at  that time and has continued to do well, though he does have an ICD.   In addition, father died of diabetes.  She has no siblings.   REVIEW OF SYSTEMS:  HEAD, EYES, EARS, NOSE, AND THROAT:  Unremarkable.  CARDIORESPIRATORY: As noted in History of Present Illness.  GI: No  diarrhea, constipation, or bleeding. GU: Negative.  MUSCULOSKELETAL:  Negative.  All other systems unremarkable.   PHYSICAL EXAMINATION:  VITAL SIGNS: Blood pressure 130/80, pulse normal  sinus rhythm, respirations normal.  Weight 300.  HEAD, EYES, EARS, NOSE, AND THROAT:  Unremarkable.  NECK: Supple.  Carotid pulses palpable and equal without bruits. JVP not  elevated.  LUNGS:  Revealed no wheezes or rhonchi today.  CARDIAC: No murmur, gallop, or rub.  ABDOMEN: Obese.  No tenderness, no masses.  EXTREMITIES: No edema.  Pulses palpable bilaterally.  NEUROLOGIC: Unremarkable.   EKG reveals nonspecific ST-T abnormalities in the inferior and anterior  leads consistent with ischemia.  The EKG of December 5 shows somewhat  slightly more ST depression than on admission.   IMPRESSION:  1. Three-vessel coronary artery disease, now 4 years post coronary      artery bypass grafting with chest discomfort, shortness of breath,      possibly related to coronary artery disease.  2. Probable acute bronchitis.  3. Hyperlipidemia on therapy.  4. Hypertension on therapy.  5. Diabetes.  6. History of diverticulosis.  7. Hypothyroidism.   Although her symptoms may be entirely related to acute bronchitis,  because of the known coronary artery disease, abnormal EKG, and fact  that she has some chest tightness, I would suggest  stress Myoview.  I do  not have old EKGs to compare, and probably this should be done before  discharge.      Cecil Cranker, MD, [facility]  Electronically Signed    EJL/MEDQ  D:  08/17/2006  T:  08/17/2006  Job:  [id number]   cc:   Donna Bernard, M.D.  Fax: [phone]

## 2011-01-29 NOTE — Discharge Summary (Signed)
[facility]  Patient:    [name], PHARR Visit Number: [id number] MRN: [id number]          Service Type: OUT Location: RAD Attending Physician:  Harlow Asa Dictated by:   Donna Bernard, M.D. Admit Date:  02/16/2002 Discharge Date: 02/16/2002                             Discharge Summary  FINAL DIAGNOSES: 1. Chest pain with potential unstable angina. 2. Type 2 diabetes. 3. Hypothyroidism. 4. Hypercholesterolemia. 5. Morbid obesity. 6. Chronic back pain with acute flare.  FINAL DISPOSITION:  Patient transferred to [facility] for catheterization and further management.  INITIAL HISTORY AND PHYSICAL:  Please see H&P as dictated.  HOSPITAL COURSE:  This patient is a 60 year old white female with a history of longstanding type 2 diabetes, hypercholesterolemia, hypothyroidism and obesity, who presented to the office the day of admission with complaints of significant chest pain.  The chest pain was substernal radiating to back.  She did notice a sense of shortness of breath.  There was no significant diaphoresis or nausea.  Based on her many risk factors, she was admitted to the hospital and we started her on subcutaneous Lovenox to help manage unstable angina.  Telemetry was utilized.  Serial EKGs were done.  Serial cardiac enzymes were performed and these were negative.  The patient maintained on her usual chronic medication.  [facility] folks were consulted; they recommended ______ and doing a cardiac catheterization, based on her presentation.  The patients cardiac enzymes remained negative.  Her cardiopulmonary exam was negative.  On the day of discharge, she was transferred to [facility] for further management, as noted above. Dictated by:   Donna Bernard, M.D. Attending Physician:  Harlow Asa DD:  02/21/02 TD:  02/23/02 Job: [id number]
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
## 2011-01-29 NOTE — Discharge Summary (Signed)
[facility]  Patient:    [name], [name] Visit Number: [id number] MRN: [id number]          Service Type: MED Location: 4700 4739 01 Attending Physician:  Mirian Mo Dictated by:   Rozell Searing, P.A. Admit Date:  01/23/2002 Discharge Date: 01/24/2002   CC:         [name], M.D.   Referring Physician Discharge [facility]  PROCEDUREs: 1. Coronary angiogram, Jan 23, 2002. 2. Adenosine Cardiolite, Jan 24, 2002  REASON FOR ADMISSION:  Ms. [name] is a 60 year old female, with no prior history of heart disease, but with multiple risk factors for ischemic heart disease, who was initially referred to [facility] for evaluation of chest discomfort.  She was seen in consultation, noted to have negative enzymes but presentation worrisome for unstable angina pectoris and subsequently referred to [facility] for diagnostic coronary angiography.  Please refer to dictated admission note for full details.  LABORATORY DATA:  Normal CBC.  Normal complete metabolic profile.  Pro time 15, INR 1.4.  CPK/MB negative x 2; troponin I less than 0.01 (x 2).  Chest x-ray at [facility], Jan 22, 2002:  No infiltrate or CHF.  HOSPITAL COURSE:  Following transfer from [facility], the patient was maintained on antianginal therapy consisting of aspirin, beta blocker, nitroglycerin paste, and Lovenox.  Follow-up cardiac enzymes were within normal limits.  The patient was initially referred for coronary angiography.  However, both femoral arteries could not be accessed and patient was unwilling to proceed via the brachial artery due to significant lower back pain.  The patient was apprised of the risks of doing so, but she was quite adamant in not proceeding any further.  She was thus referred for adenosine Cardiolite scan the following morning.  Perfusion images were initially felt to be equivocal with preserved ejection fraction (56%).   The area in question was the inferior wall, and this was due to bowel interference; however, following review of the images by Dr. Eden Emms in conjunction with Dr. Fredia Sorrow, this territory was felt to represent diaphragmatic attenuation, and there was no evidence of wall motion abnormality - ejection fraction was confirmed.  Dr. Eden Emms concluded that this represented a low-risk study and that patient was cleared for discharge.  Recommendation was to continue the current medication regimen.  Of note, Dr. Eden Emms recommended that if patient presents with recurrent pain, consideration would need to be given to performing coronary angiography via the brachial artery.  DISCHARGE MEDICATIONS: 1. Synthroid 0.1 mg q.d. 2. Avandia 8 mg q.d. 3. Glyburide 5 mg b.i.d. 4. Zocor 20 mg q.h.s. 5. Baby aspirin 81 mg q.d. 6. Protonix 40 mg q.d. 7. Toprol XL 50 mg q.d. 8. Imdur 30 mg q.d. 9. Nitrostat 0.4 mg p.r.n.  INSTRUCTIONS: 1. Return to baseline level of activity as tolerated. 2. Maintain low fat/cholesterol and diabetic diet.  FOLLOW-UP: 1. The patient is instructed to call and schedule a follow up appointment with Dr. Juanito Doom at the [facility] in approximately two weeks. 2. She will return to Dr. Lubertha South as previously scheduled.  DISCHARGE DIAGNOSES: 1. Nonischemic chest pain.    a. Normal serial cardiac enzymes.    b. Normal adenosine Cardiolite; ejection fraction 56%. 2. Multiple cardiac risk factors.    a. Type 2 diabetes mellitus.    b. Dyslipidemia. 3. Morbid obesity. 4. Chronic back pain. 5. Treated hypothyroidism. 6. History of gastroesophageal reflux disease. Dictated by:  Gene Serpe, P.A. Attending Physician:  Mirian Mo DD:  01/24/02 TD:  01/24/02 Job: [id number]

## 2011-01-29 NOTE — Op Note (Signed)
[place]. [facility]  Patient:    [name], [name] Visit Number: [id number] MRN: [id number]          Service Type: SUR Location: 2000 2041 01 Attending Physician:  Tressie Stalker Dictated by:   Salvatore Decent. Cornelius Moras, M.D. Proc. Date: 03/02/02 Admit Date:  02/28/2002   CC:         Thomas C. Daleen Squibb, M.D. [facility]  Maisie Fus D. Riley Kill, M.D. [facility]  [name], M.D.   Operative Report  PREOPERATIVE DIAGNOSIS:  Three-vessel coronary artery disease with class 4 unstable angina.  POSTOPERATIVE DIAGNOSIS:  Three-vessel coronary artery disease with class 4 unstable angina.  OPERATION PERFORMED:  Median sternotomy for coronary artery bypass grafting times five (left internal mammary artery to distal left anterior descending coronary artery, saphenous vein graft to second diagonal branch, saphenous vein graft to first diagonal branch and sequential saphenous vein graft to circumflex marginal branch, saphenous vein graft to posterior descending coronary artery)  SURGEON:  Salvatore Decent. Cornelius Moras, M.D.  ASSISTANT:  Lissa Merlin, P.A.  ANESTHESIA:  General.  INDICATIONS FOR PROCEDURE:  The patient is a 60 year old morbidly obese white female followed by Dr. Lubertha South and Dr. Juanito Doom.  The patient presents with class IV unstable angina.  Cardiac catheterization performed by Dr. Bonnee Quin demonstrated three-vessel coronary artery disease with high grade stenosis of the right coronary artery.  There was preserved left ventricular function.  A full consultation note has been dictated previously.  The patient and her husband have been counseled at length, regarding the indications and potential benefits of coronary artery bypass grafting.  Alternative treatment strategies have been discussed.  All of their questions have been addressed. They understand the somewhat high risk nature of surgery, particularly with risk for wound complication regarding the patients severe morbid  obesity and diabetes.  They desire to proceed as described.  DESCRIPTION OF PROCEDURE:  The patient was brought to the operating room on the above-mentioned date and central monitoring was established by the anesthesia service under the care and direction of Dr. Judie Petit. Specifically, a Swan-Ganz catheter was placed through the right internal jugular approach.  A right radial arterial line was placed.  Intravenous antibiotics were administered.  The patient was placed in supine position on the operating table.  Following induction of general endotracheal anesthesia, the patients chest, abdomen, both groins, and both lower extremities were prepared and draped in a sterile manner after placement of a Foley catheter.  A median sternotomy incision was performed and the left internal mammary artery was dissected from the chest wall and prepared for bypass grafting. The left internal mammary artery was notably good quality conduit. Simultaneously saphenous vein was obtained from the patients right lower extremity.  The saphenous vein from the right thigh was obtained using endoscopic vein harvest technique.  A longitudinal incision in the lower leg was utilized to harvest the saphenous vein from the lower leg.  The saphenous vein was notably good quality conduit.  The patient was heparinized systemically.  The pericardium was opened.  The ascending aorta was normal in appearance. The ascending aorta was normal in appearance.  The ascending aorta and right atrium were cannulated for cardiopulmonary bypass.  Adequate heparinization was verified.  Cardiopulmonary bypass was begun and the surface of the heart was inspected. Distal sites were selected for coronary artery bypass grafting.  Portions of saphenous vein and the left internal mammary artery were all trimmed to appropriate lengths.  A temperature probe was placed in  the left ventricular septum and a styrofoam pad was placed to  protect the left phrenic nerve from thermal injury.  A cardioplegia catheter was placed in the ascending aorta.  The patient was cooled to 32 degrees systemic temperature.  The aortic crossclamp was applied and cardioplegia was delivered in antegrade fashion through the aortic root.  Iced saline slush was applied for topical hypothermia.  The initial cardioplegic arrest and myocardial cooling were felt to be excellent.  Additional doses of cardioplegia were administered intermittently throughout the crossclamp portion of the operation both through the aortic root and down subsequently placed vein grafts to maintain septal temperature below 15 degrees centigrade.  The following distal coronary anastomoses were performed:  (1) The posterior descending coronary artery was grafted with the saphenous vein graft in an end-to-side fashion.  This coronary measured 1.5 mm at the site of distal bypass and was of good quality. (2) The first diagonal branch off the left anterior descending coronary artery was grafted with a saphenous vein graft in a side-to-side fashion.  This coronary measured 1.5 mm in diameter and was of good quality.  (3) The circumflex marginal branch was grafted with sequential saphenous vein graft using the vein placed to the first diagonal branch.  This coronary measured 1.3 mm in diameter and was of good quality.  (4) The second diagonal branch off the left anterior descending coronary artery was grafted with saphenous vein graft in an end-to-side fashion.  This coronary measured 1.5 mm in diameter and was of good quality.  (5) The distal left anterior descending coronary artery was grafted with a left internal mammary artery in end-to-side fashion.  This coronary measured 1.5 mm in diameter and was of good quality. All three proximal saphenous vein anastomoses were performed directly to the  ascending aorta prior to removal of the aortic crossclamp.  The septal temperature  was noted to rise rapidly and dramatically upon reperfusion of the left internal mammary artery.  The aortic crossclamp was removed after a total crossclamp time of 81 minutes.  The heart began to beat spontaneously without need for cardioversion.  All proximal and distal anastomoses were inspected for hemostasis and appropriate graft orientation.  Epicardial pacing wires were fixed to the right ventricular outflow tract and to the right atrial appendage.  The patient was weaned from cardiopulmonary bypass without difficulty.  The patients rhythm at separation from bypass was normal sinus rhythm.  No inotropic support was required.  Total cardiopulmonary bypass time for the operation was 116 minutes.  The venous and arterial cannulae were removed uneventfully.  Protamine was administered to reverse the anticoagulation.  The mediastinum and the left chest were irrigated with saline solution containing vancomycin.  Meticulous surgical hemostasis was ascertained.  The mediastinum and the left chest were drained with three chest tubes placed through separate stab incisions inferiorly.  The median sternotomy was closed using double thickness sternal wires to reapproximate the sternum.  The anterior sternal fascia and subcutaneous tissues were all closed with multiple interrupted sutures.  The skin incision was closed with skin staples.  The right lower extremity incisions were all closed in multiple layers in routine fashion.  All skin incisions were closed with skin staples.  The patient tolerated the procedure well and was transported to the surgical intensive care unit in stable condition.  There were no intraoperative complications.  All sponge, needle and instrument counts were verified correct at the completion of the operation.  No autologous blood products were administered.  I do not believe that the patient should be considered a candidate for redo coronary artery bypass  grafting in the future unless she could somehow loose a tremendous amount of weight. Dictated by:   Salvatore Decent Cornelius Moras, M.D. Attending Physician:  Tressie Stalker DD:  03/02/02 TD:  03/05/02 Job: [id number]

## 2011-01-29 NOTE — H&P (Signed)
NAME:  [name], [name]               ACCOUNT NO.:  [id number]   MEDICAL RECORD NO.:  [id number]          PATIENT TYPE:  INP   LOCATION:  A214                          FACILITY:  [facility]   PHYSICIAN:  Donna Bernard, M.D.DATE OF BIRTH:  03/12/1951   DATE OF ADMISSION:  08/16/2006  DATE OF DISCHARGE:  LH                              HISTORY & PHYSICAL   CHIEF COMPLAINT:  Chest discomfort, shortness of breath, cough.   SUBJECTIVE:  This patient states recently her sugars have been running  generally good.  Several days ago, she began to develop cough and  congestion.  The cough was productive at times of greenish phlegm.  She  noted no fever.  She did have sensation of pressure in her chest.  She  said was reminiscent of the discomfort she had 4-5 years ago when she  came in with ischemia and was subsequently diagnosed with coronary  artery disease and received five-vessel bypass.  The patient has noted  some orthopnea.  She has noted some wheeziness.  She has used her  albuterol on a p.r.n. basis.   The patient claims compliance with current medicines include:  1. Glyburide 5 mg 2 p.o. b.i.d.  2. Zocor 80 mg every night.  3. Temazepam 15 every night p.r.n.  4. Glucophage 500 two daily.  5. Toprol 25 XL 1 daily.  6. Aspirin just 81 mg at this time 1 daily.  7. Lasix 40 mg daily, bumped up to 80 mg daily in the last few days.  8. Klor-Con 20 mEq 1 daily.   PAST SURGERIES:  1. Remote shoulder surgery.  2. Hysterectomy.  3. Cholecystectomy.  4. Five-vessel coronary artery bypass.   PRIOR MEDICAL HISTORY:  Significant for chronic problems as noted above  including:  1. Type 2 diabetes.  2. Hypothyroidism.  3. Hyperlipidemia.  4. Diverticulosis.  5. Coronary artery disease.   FAMILY HISTORY:  Significant for pneumonia.   SOCIAL HISTORY:  The patient is married.  No tobacco, no alcohol use.  Two children.   REVIEW OF SYSTEMS:  Otherwise negative.   Vital Signs:  BP 138/80,  afebrile, weight 301 pounds.  HEENT:  Mild nasal congestion.  NECK:  Supple.  LUNGS:  Bilateral expiratory wheezes.  Significant mild tachypnea.  Chest wall nontender.  HEART:  Mild tachycardia, positive flow murmur.  ABDOMEN:  Soft.  No significant tenderness.  EXTREMITIES:  Trace edema.  Pulses good.  NEUROLOGICAL:  Intact.  Sensation intact.   SIGNIFICANT LABORATORY DATA:  CBC:  8.5 thousand white blood count, 14  hemoglobin. Creatinine good.  Potassium good.  BNP 7.  Initial set of  cardiac enzymes negative.  Chest x-ray:  Cardiomegaly which is a stable  finding and bronchitis via chest x-ray.  Electrocardiogram:  Nonspecific  diffuse ST-segment changes   IMPRESSION:  1. Subacute onset of wheezing, shortness of breath, chest pressure and      chest discomfort with a background history of severe coronary      artery disease and status post five-vessel bypass. Though likely      all respiratory in etiology,  I felt we had suspect the patient's      feeling that many of her symptoms were reminiscent of the last time      her heart decided to declare some blockage.  Based on this, we      admitted her to the hospital for further workup of her further      symptoms and management.  2. Type 2 diabetes.  3. Hypothyroidism.  4. Hyperlipidemia.  5. Obesity.   PLAN:  Serial cardiac enzymes.  IV steroids.  Insulin coverage with  sliding scale.  IV Levaquin.  Xopenex treatments.  Nasal cannula O2.  CCNT.  Further orders as noted in chart.      Donna Bernard, M.D.  Electronically Signed     WSL/MEDQ  D:  08/16/2006  T:  08/17/2006  Job:  [id number]

## 2011-01-29 NOTE — Cardiovascular Report (Signed)
[place]. [facility]  Patient:    [name], [name] Visit Number: [id number] MRN: [id number]          Service Type: CAT Location: 3700 3713 01 Attending Physician:  Ronaldo Miyamoto Dictated by:   Arturo Morton Riley Kill, M.D. [facility] Proc. Date: 02/28/02 Admit Date:  02/28/2002   CC:         Thomas C. Daleen Squibb, M.D. [facility]  [name], M.D.   Cardiac Catheterization  INDICATIONS:  [name] is a 60 year old female who presents with progressive angina.  An attempted catheterization by Dr. Eden Emms was unable to be performed because they could not cannulate the femoral arteries due to the patients obesity.  As a result, she had a Cardiolite study followed by a recommended catheterization from the arm.  She is back in the lab today for further treatment and evaluation.  PROCEDURE: 1. Left heart catheterization. 2. Selective coronary angiography. 3. Selective left ventriculography.  DESCRIPTION OF PROCEDURE:  The procedure is performed from the left brachial artery using 6 French catheters and a short brachial sheath.  3000 units of intravenous heparin was given initially.  She tolerated the procedure without complications and was taken to the holding area in satisfactory clinical condition.  We used a JL3.5 to cannulate the left coronary.  HEMODYNAMIC DATA:  Central aorta 160/97.  Left ventricular pressure 168/36. No aortic to left ventricular gradient on pullback across the aortic valve.  ANGIOGRAPHIC DATA: 1. On plain fluoroscopy there was moderate calcification of all of the    coronary arteries. 2. The left main coronary artery was short and free of critical disease. 3. The LAD coursed to the apex.  There was calcification of the LAD, but as    far as focal narrowing, there was about 30 to 40% narrowing distally.    There is a second diagonal branch that appears to have some ostial disease,    and this is somewhat difficult to grade, but would be  graded as 60%. 4. There is an AV circumflex.  There is diffuse disease of the circumflex    system distally, although, this is a small caliber vessel.  We graded this    as 70 to 80%. 5. The right coronary artery is severely diseased.  There is diffuse 30%    narrowing in the proximal vessel followed by a 90% focal stenosis in    the midvessel.  There is a second 90% stenosis followed by tandem lesions    of 70% prior to the PDA.  The PDA itself has about a 70% area of segmental    disease.  Ventriculography in the RAO projection reveals vigorous global systolic function.  Because of ventricular ectope, ejection fraction could not be calculated, but overall systolic function was normal and ejection fraction appeared to exceed 60%.  CONCLUSION: 1. Preserved left ventricular function. 2. High grade disease of the right coronary artery with multiple coronary    lesions. 3. Segmental diffuse disease of the AV circumflex.  I will plan to review the options with the patient.  Percutaneous coronary intervention would require multiple stents in the right coronary.  I will review these options with the patient in detail. Dictated by:   Arturo Morton Riley Kill, M.D. [facility] Attending Physician:  Ronaldo Miyamoto DD:  02/28/02 TD:  03/01/02 Job: [id number]

## 2011-01-29 NOTE — Discharge Summary (Signed)
NAME:  [name], [name]               ACCOUNT NO.:  [id number]   MEDICAL RECORD NO.:  [id number]          PATIENT TYPE:  INP   LOCATION:  A214                          FACILITY:  [facility]   PHYSICIAN:  Donna Bernard, M.D.DATE OF BIRTH:  04/18/1951   DATE OF ADMISSION:  08/16/2006  DATE OF DISCHARGE:  12/07/2007LH                               DISCHARGE SUMMARY   FINAL DIAGNOSES:  1. Exacerbation of asthma and reactive airways.  2. Chest pain, myocardial infarction ruled out.  3. Negative stress test.  4. Known coronary artery disease.  5. Type 2 diabetes with suboptimal control.   DISPOSITION:  Patient discharged to home.   DISCHARGE INSTRUCTIONS:  1. Maintain all usual medications.  2. Sliding scale of Humalog insulin with meals.  3. Follow up in the office in 5-7 days.   INITIAL H&P:  Please see H&P as dictated   HOSPITAL COURSE:  This patient is a 60 year old white female, 4 years  status post coronary bypass with type 2 diabetes, hyperlipidemia and  hypertension who presented to the office on the day of admission with  chest discomfort which she describes as a deep ache, accompanied by  significant shortness of breath and cough.  We did a chest x-ray which  showed cardiomegaly and bronchitis.  The patient's initial enzymes were  negative.  The patient was given frequent nebulizer treatments.  Cardiology folks were consulted. Serial cardiac enzymes were negative.  Over the next several days, the patient improved. Her breathing  improved.  Her glucose was quite high, and so she was placed on sliding  scale via Humalog insulin.  On the day of discharge, the patient was  feeling better.  She is breathing easier. Her wheezes had pretty much  disappeared.  She was discharged home with diagnoses and disposition as  noted above.      Donna Bernard, M.D.  Electronically Signed     WSL/MEDQ  D:  10/03/2006  T:  10/03/2006  Job:  [id number]

## 2011-01-29 NOTE — Letter (Signed)
September 20, 2006    W. Simone Curia, M.D.  [address]  [place], [place] [address]   RE:  [name], [name]  MRN:  [id number]  /  DOB:  [birth year]   Dear Brett Canales:   Ms. Doane returns to the office following her recent admission to  [facility] where a myocardial infarction was ruled out. She  underwent stress Myoview imaging, which was negative for ischemia or  significant infarction. She has done well since discharge with no  further chest discomfort. She does have asthmatic bronchitis and is  experiencing a mild exacerbation at present with an increased use of her  MDI. Her other medications include:   1. Glyburide 10 mg b.i.d.  2. Furosemide 80 mg daily.  3. Kay Ciel 20 mEq daily.  4. Simvastatin 80 mg daily.  5. Metformin 500 mg b.i.d.  6. Metoprolol 25 mg daily.  7. Levothyroxine 0.1 mg daily.  8. Aspirin 325 mg daily.   Diabetic control has been suboptimal with CBGs up to 200. She is not  aware of any recent hemoglobin A1c level. Vaccinations are up to date.   PHYSICAL EXAMINATION:  GENERAL:  Pleasant, overweight woman in no acute  distress.  VITAL SIGNS:  The weight is 292, blood pressure 100/70, heart rate 85  and regular, respirations 16.  NECK:  No jugular venous distention; normal carotid upstrokes without  bruit.  CARDIAC:  Distant first and second heart sounds; median sternotomy scar.  LUNGS:  Clear.  ABDOMEN:  Soft and nontender; no organomegaly.  EXTREMITIES:  No edema; normal distal pulses.   Recent lipid profile is relatively good with total cholesterol of 162,  HDL of 34 and LDL of 101.   IMPRESSION:  Ms. Swopes is doing generally well. Considering her young  age, optimal control of cardiovascular risk factors is warranted. Will  change simvastatin to Vytorin 10/40 mg daily in an attempt to reduce LDL  closer to 70. She will start fish oil 1 capsule b.i.d. You may wish to  reassess diabetic control to determine if she needs an increased  dose of  metformin. I will plan to see this nice woman again in one year.    Sincerely,      Gerrit Friends. Dietrich Pates, MD, [facility]  Electronically Signed    RMR/MedQ  DD: 09/20/2006  DT: 09/20/2006  Job #: [id number]
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
## 2011-01-29 NOTE — Cardiovascular Report (Signed)
[place]  Patient:    [name], [name] Visit Number: [id number] MRN: [id number]          Service Type: MED Location: [id number] Attending Physician:  Mirian Mo Dictated by:   Noralyn Pick Eden Emms, M.D. [facility] Admit Date:  01/23/2002 Discharge Date: 01/24/2002   CC:         Thomas C. Daleen Squibb, M.D. [facility]  Lilyan Punt, M.D.   Cardiac Catheterization  INDICATIONS:  The patient is a 60 year old patient of Dr. Daleen Squibb.  She has had chest pain.  She ruled out for myocardial infarction.  She was referred for catheterization.  Unfortunately, due to the patients size, we were unable to cannulate her right or left femoral artery.  Multiple attempts from both sides were made. We were unable to thread wires up either side.  We used standard Judkins needle as well as Smart needle.  Smart needle, we were unable to Doppler any pulses.  During the case, the patient asked [place] to stop.  She has significant chronic back pain.  I told her that we could proceed from the left arm to further assess her coronary anatomy, but she refused to have this done.  She will have her hemoglobin and hematocrit monitored and we will do a Cardiolite study in the morning to further risk stratify her.  This was discussed with Dr. Daleen Squibb and he agrees with this plan. Dictated by:   Noralyn Pick Eden Emms, M.D. [facility] Attending Physician:  Mirian Mo DD:  01/23/02 TD:  01/25/02 Job: [id number]

## 2011-01-29 NOTE — Discharge Summary (Signed)
[place]. [facility]  Patient:    [name], [name] Visit Number: [id number] MRN: [id number]          Service Type: OUT Location: RAD Attending Physician:  Mirian Mo Dictated by:   Anne Hahn, P.A.-C Admit Date:  03/14/2002 Discharge Date: 03/14/2002   CC:         Thomas C. Daleen Squibb, M.D. [facility]  [name], M.D.   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  ADDITIONAL DIAGNOSES: 1. Type 2 diabetes mellitus. 2. Hyperlipidemia. 3. Morbid obesity. 4. Chronic low back pain. 5. Gastroesophageal reflux disease. 6. Hypothyroidism. 7. Postoperative deconditioning.  PROCEDURES PERFORMED: 1. Cardiac catheterization. 2. Coronary artery bypass grafting x5 (left internal mammary artery to the    LAD, saphenous vein graft to the posterior descending artery.    Saphenous vein graft to the second diagonal, saphenous vein graft    sequentially to the first diagonal and the obtuse marginal). 3. Endoscopic vein harvest.  HISTORY:  The patient is a 60 year old female who was recently admitted to [facility] with complaints of chest pain.  She was transferred to [facility] for cardiac catheterization.  However, because of her morbid obesity, they were unable to cannulate her right femoral artery.  The patient refused to proceed with catheterization via a brachial approach and the catheterization was cancelled.  She underwent an adenosine cardiolite study only the 14th which was complicated by an inability to see the inferior wall.  Her ejection fraction was 56%.  Since that time, she has continued to have episodes of chest pain with exertion requiring nitroglycerin.  This was concerning and it was recommended that she proceed with catheterization via a left brachial attempt.  She did agree to proceed.  HOSPITAL COURSE:  She was admitted on June 18 and underwent  cardiac catheterization which showed severe coronary artery disease which was not felt to be amenable to percutaneous intervention.  She was seen in consultation by Dr. Cornelius Moras and it was agreed that she should proceed with surgical revascularization.  She was taken to the operating room on June 20th and underwent CABG x5 with the above-noted grafts.  She tolerated the procedure well and was transferred to the SICU in stable condition.  She was extubated shortly after surgery.  She was hemodynamically stable and doing well postoperatively day #1.  At that time, she was able to be transferred to the floor.  Postoperative course was relatively uneventful.  It was mainly complicated by her deconditioning related to her obesity.  She required significant assistance to ambulate as well as to transfer.  Otherwise, she has done well.  She has remained afebrile and all vital signs have been stable. Her incisions are healing well.  She has maintained normal sinus rhythm.  She has been weaned of supplemental oxygen with O2 saturations of 91% or greater on room air.  Her blood sugars have remained fairly stable.  At the time of discharge, however, she was ambulating with a walker and assistance.  It is felt that she may be discharged home at this time.  DISCHARGE MEDICATIONS:  1. Enteric-coated aspirin 325 mg q.d.  2. Toprol XL 25 mg q.d.  3. Lantus insulin 20 units q.h.s.  4. Lasix 40 mg b.i.d. x1 week.  5. K-Dur 20 mEq q.d. x1 week.  6. Niferex 150 mg b.i.d.  7. Glucophage 500 mg b.i.d.  8. Synthroid 100 mcg q.d.  9. Zocor 40 mg q.d. 10. Protonix 40 mg q.d. 11. Glyburide 5 mg b.i.d. 12. Avandia 8 mg q.d. 13. Tylox 1-2 q.4h p.r.n. for pain.  DISCHARGE INSTRUCTIONS: 1. She is to refrain from driving, heavy lifting, or strenuous activity. 2. She should continue the daily walking and use of incentive spirometry. 3. She may continue a low-fat, low-sodium diabetic diet. 4. She may shower daily  and clean incisions with soap and water.  Home health    nursing will be arranged for cardiac restorative care and will remove her    staples in one week. 5. She will follow up with Dr. Daleen Squibb in two weeks and will have a chest x-ray    at that time.  She will follow up with Dr. Cornelius Moras in three weeks, and is    asked to bring her chest x-ray to that appointment for his review. Dictated by:   Anne Hahn, P.A.-C Attending Physician:  Mirian Mo DD:  03/26/02 TD:  03/28/02 Job: [id number]

## 2011-04-13 ENCOUNTER — Encounter: Payer: Self-pay | Admitting: Gastroenterology

## 2011-04-13 ENCOUNTER — Ambulatory Visit (INDEPENDENT_AMBULATORY_CARE_PROVIDER_SITE_OTHER): Payer: BC Managed Care – PPO | Admitting: Gastroenterology

## 2011-04-13 DIAGNOSIS — R197 Diarrhea, unspecified: Secondary | ICD-10-CM

## 2011-04-13 DIAGNOSIS — K529 Noninfective gastroenteritis and colitis, unspecified: Secondary | ICD-10-CM

## 2011-04-13 DIAGNOSIS — D649 Anemia, unspecified: Secondary | ICD-10-CM | POA: Insufficient documentation

## 2011-04-13 LAB — HEPATIC FUNCTION PANEL
ALT: 18 U/L (ref 7–35)
AST: 25 U/L
Bilirubin, Direct: 0.1 mg/dL (ref 0.01–0.4)
Bilirubin, Indirect: 0.3

## 2011-04-13 NOTE — Assessment & Plan Note (Signed)
Await pending labs which were done today. If no evidence of anemia, no indication for repeat colonoscopy at this time. Next colonoscopy would be due in October 2019. If she has anemia, consider checking for occult blood in the stool as next step as well as anemia profile.

## 2011-04-13 NOTE — Progress Notes (Signed)
Cc to PCP 

## 2011-04-13 NOTE — Progress Notes (Signed)
Primary Care Physician:  Harlow Asa, MD, MD  Primary Gastroenterologist:  Jonette Eva, MD   Chief Complaint  Patient presents with  . Anemia    HPI:  [name] is a 60 y.o. female here at the request of Dr. Lubertha South for further evaluation of anemia. Patient was hospitalized in March of this year with uncontrolled diabetes and acute asthmatic bronchitis. On admission her hemoglobin was 13.3 and at the time of discharge her hemoglobin was 11.2, likely dilutional. She has h/o chronic diarrhea, possibly secondary to IBS. Diarrhea most days. PP loose stools. Tries to avoid fatty foods b/c it makes worse. BM up to five daily. Lomotil prn. No abdominal cramps. Takes protonix prn only for heartburn. Does not have to take regularly. No dsyphagia. No N/V.   Patient evaluated in 2009 for diarrhea. She had EGD and ileocolonoscopy. See PSH for details. Work-up negative for Celiac disease and microscopic colitis. She failed to f/u for return OV.   Current Outpatient Prescriptions  Medication Sig Dispense Refill  . albuterol (PROVENTIL) (2.5 MG/3ML) 0.083% nebulizer solution Take 2.5 mg by nebulization every 6 (six) hours as needed.        . ALPRAZolam (XANAX) 0.5 MG tablet 0.5 mg at bedtime as needed. Usually about every third night.      [name] aspirin 81 MG tablet Take 81 mg by mouth daily.        . furosemide (LASIX) 40 MG tablet Take 80 mg by mouth daily.       [name] ibuprofen (ADVIL,MOTRIN) 200 MG tablet Take 200 mg by mouth. Takes 3 at a time at tid      . insulin glargine (LANTUS) 100 UNIT/ML injection Inject 32 Units into the skin 2 (two) times daily.       . insulin lispro (HUMALOG) 100 UNIT/ML injection Inject into the skin 3 (three) times daily before meals. Sliding scale      . latanoprost (XALATAN) 0.005 % ophthalmic solution       . levothyroxine (SYNTHROID, LEVOTHROID) 112 MCG tablet 112 mcg daily.       . potassium chloride SA (K-DUR,KLOR-CON) 20 MEQ tablet Take 20 mEq by mouth 2 (two)  times daily.        . CRESTOR 40 MG tablet 40 mg daily.       . diphenoxylate-atropine (LOMOTIL) 2.5-0.025 MG per tablet as needed.       . glyBURIDE (DIABETA) 5 MG tablet 5 mg 2 (two) times daily.       [name] lisinopril (PRINIVIL,ZESTRIL) 20 MG tablet 20 mg daily.       . metFORMIN (GLUCOPHAGE) 500 MG tablet 500 mg 2 (two) times daily.       . metoprolol succinate (TOPROL-XL) 25 MG 24 hr tablet 25 mg daily.       . pantoprazole (PROTONIX) 40 MG tablet 40 mg daily as needed.         Allergies as of 04/13/2011  . (No Known Allergies)    Past Medical History  Diagnosis Date  . Diabetes mellitus   . Hypothyroidism   . Glaucoma   . Coronary artery disease   . Hypercholesterolemia   . Hypertension   . Depression   . Anxiety   . Morbid obesity   . Irritable bowel syndrome   . Chronic diarrhea     Negative workup for microscopic colitis and Celiac disease in 2009.  . Asthma     Past Surgical History  Procedure Date  .  Coronary artery bypass graft 2003    5 vessel  . Partial hysterectomy 1990s  . Cholecystectomy 1990s  . Tubal ligation 1978  . Shoulder surgery   . Ileocolonoscopy October 2009    Normal terminal ileum, greater sigmoid colon diverticula, no polyps. Random biopsies negative for microscopic colitis.  . Esophagogastroduodenoscopy October 2009     mild hypertrophy of the gastric mucosa seen in the body with mild erythema seen in the antrum appeared biopsy negative for H. pylori. 6 mm x 1 cm gastric nodules in the mid body, biopsy showed chronic gastritis the duodenal biopsies negative for celiac disease. Also had negative celiac disease serologies. large mouth duodenal diverticulum. One additional duodenal diverticula in the second portion    Family History  Problem Relation Age of Onset  . Cancer Mother 20    Unknown primary, possibly pancreatic  . Diabetes Father     Possible heart attack  . Colon cancer Neg Hx   . Liver disease Neg Hx     History   Social  History  . Marital Status: Married    Spouse Name: N/A    Number of Children: 2  . Years of Education: N/A   Occupational History  .     Social History Main Topics  . Smoking status: Never Smoker   . Smokeless tobacco: Not on file  . Alcohol Use: No  . Drug Use: No  . Sexually Active: Not on file   Other Topics Concern  . Not on file   Social History Narrative  . No narrative on file      ROS:  General: Negative for anorexia, weight loss, fever, chills, fatigue, weakness. Eyes: Negative for vision changes.  ENT: Negative for hoarseness, difficulty swallowing , nasal congestion. CV: Negative for chest pain, angina, palpitations, dyspnea on exertion, peripheral edema.  Respiratory: Negative for dyspnea at rest, dyspnea on exertion, cough, sputum. Intermittent wheezing, h/o asthma.  GI: See history of present illness. GU:  Negative for dysuria, hematuria, urinary incontinence, urinary frequency, nocturnal urination.  MS: Negative for joint pain, low back pain.  Derm: Negative for rash or itching.  Neuro: Negative for weakness, abnormal sensation, seizure, frequent headaches, memory loss, confusion.  Psych: Negative for suicidal ideation, hallucinations. Depression/Anxiety.  Endo: Negative for unusual weight change.  Heme: Negative for bruising or bleeding. Allergy: Negative for rash or hives.    Physical Examination:  BP 126/73  Pulse 85  Temp(Src) 98 F (36.7 C) (Temporal)  Ht 5\' 4"  (1.626 m)  Wt 284 lb (128.822 kg)  BMI 48.75 kg/m2   General: Well-nourished, well-developed morbidly obese in no acute distress.  Head: Normocephalic, atraumatic.   Eyes: Conjunctiva pink, no icterus. Mouth: Oropharyngeal mucosa moist and pink , no lesions erythema or exudate. Neck: Supple without thyromegaly, masses, or lymphadenopathy.  Lungs: Clear to auscultation bilaterally.  Heart: Regular rate and rhythm, no murmurs rubs or gallops.  Abdomen: Bowel sounds are normal,  nontender, nondistended, no hepatosplenomegaly or masses, no abdominal bruits or hernia , no rebound or guarding.  Exam significantly limited due to body habitus. Rectal: Not performed. Extremities: No lower extremity edema. No clubbing or deformities.  Neuro: Alert and oriented x 4 , grossly normal neurologically.  Skin: Warm and dry, no rash or jaundice.   Psych: Alert and cooperative, normal mood and affect.  Labs: 12/31/2010, BUN 19, creatinine 0.79, total bilirubin 0.4, alkaline phosphatase 65, AST 25, ALT 19, albumin 4.2, TSH 3.539, hemoglobin A1c 8.5.

## 2011-04-13 NOTE — Assessment & Plan Note (Signed)
Likely due to IBS, diabetic enteropathy, or small bowel bacterial overgrowth. Await labs. Consider hydrogen breath test.

## 2011-04-19 ENCOUNTER — Ambulatory Visit (INDEPENDENT_AMBULATORY_CARE_PROVIDER_SITE_OTHER): Payer: BC Managed Care – PPO | Admitting: Adult Health

## 2011-04-19 ENCOUNTER — Encounter: Payer: Self-pay | Admitting: Adult Health

## 2011-04-19 VITALS — BP 140/68 | HR 95 | Ht 64.0 in | Wt 287.0 lb

## 2011-04-19 DIAGNOSIS — E7800 Pure hypercholesterolemia, unspecified: Secondary | ICD-10-CM

## 2011-04-19 DIAGNOSIS — I2510 Atherosclerotic heart disease of native coronary artery without angina pectoris: Secondary | ICD-10-CM

## 2011-04-19 NOTE — Patient Instructions (Signed)
**Note De-Identified Jonus Coble Obfuscation** Your physician has requested that you have a lexiscan myoview. For further information please visit [email or web address] Please follow instruction sheet, as given.  Your physician recommends that you continue on your current medications as directed. Please refer to the Current Medication list given to you today.  Your physician recommends that you schedule a follow-up appointment in: after test

## 2011-04-19 NOTE — Assessment & Plan Note (Signed)
She has not had cardiac assessment in over 4 years. She is asymptomatic but continues to have significant risk factors. CABG was 9 years ago. Will plan a 2 day, lexiscan,stress myoview for assessment of disease in her bypass grafts. She does not want to have cardiac catheterization unless it is absolutely necessary. She has agreed to have the noninvasive testing.  She will continue on all medications as she is taking.

## 2011-04-19 NOTE — Progress Notes (Signed)
HPI:  [name] is a friendly, morbidly obese female patient of [name] who has been lost to follow-up with known history of CAD, (5 vessel CABG in 2003), diabetes, hypothyroidism, asthma.  She is seen regularly by Dr. Lubertha South who referred her to [place], as she had not been seen since 2008.  She has been without symptoms, but has admitted to excessive stress in her life with obtaining custody of her 59 y/o granddaughter.  She has had a difficult time with blood sugar control and medications are being adjusted by Dr. Gerda Diss.  She denies chest pain, excessive shortness of breath, dizziness. She is more tired lately, which she attributes to stress.  She has had no new diagnosis, durg allergies, or hospitalizations since being seen last. She had a colonoscopy in 2011.  No Known Allergies  Current Outpatient Prescriptions  Medication Sig Dispense Refill  . albuterol (PROVENTIL) (2.5 MG/3ML) 0.083% nebulizer solution Take 2.5 mg by nebulization every 6 (six) hours as needed.        . ALPRAZolam (XANAX) 0.5 MG tablet 0.5 mg at bedtime as needed. Usually about every third night.      [name] aspirin 325 MG tablet Take 325 mg by mouth daily.        . CRESTOR 40 MG tablet 40 mg daily.       . diphenoxylate-atropine (LOMOTIL) 2.5-0.025 MG per tablet as needed.       . furosemide (LASIX) 40 MG tablet Take 80 mg by mouth daily.       [name] glyBURIDE (DIABETA) 5 MG tablet 5 mg 2 (two) times daily.       [name] ibuprofen (ADVIL,MOTRIN) 200 MG tablet Take 200 mg by mouth. Takes 3 at a time at tid      . insulin glargine (LANTUS) 100 UNIT/ML injection Inject 32 Units into the skin 2 (two) times daily.       . insulin lispro (HUMALOG) 100 UNIT/ML injection Inject into the skin 3 (three) times daily before meals. Sliding scale      . latanoprost (XALATAN) 0.005 % ophthalmic solution       . levothyroxine (SYNTHROID, LEVOTHROID) 112 MCG tablet 112 mcg daily.       [name] lisinopril (PRINIVIL,ZESTRIL) 20 MG tablet 20 mg daily.        . metFORMIN (GLUCOPHAGE) 500 MG tablet 500 mg 2 (two) times daily.       . metoprolol succinate (TOPROL-XL) 25 MG 24 hr tablet 25 mg daily.       . ONE TOUCH ULTRA TEST test strip       . pantoprazole (PROTONIX) 40 MG tablet 40 mg daily as needed.       . potassium chloride SA (K-DUR,KLOR-CON) 20 MEQ tablet Take 20 mEq by mouth 2 (two) times daily.          Past Medical History  Diagnosis Date  . Diabetes mellitus   . Hypothyroidism   . Glaucoma   . Coronary artery disease   . Hypercholesterolemia   . Hypertension   . Depression   . Anxiety   . Morbid obesity   . Irritable bowel syndrome   . Chronic diarrhea     Negative workup for microscopic colitis and Celiac disease in 2009.  . Asthma     Past Surgical History  Procedure Date  . Partial hysterectomy 1990s  . Cholecystectomy 1990s  . Tubal ligation 1978  . Shoulder surgery   . Ileocolonoscopy October 2009  Normal terminal ileum, greater sigmoid colon diverticula, no polyps. Random biopsies negative for microscopic colitis.  . Esophagogastroduodenoscopy October 2009     mild hypertrophy of the gastric mucosa seen in the body with mild erythema seen in the antrum appeared biopsy negative for H. pylori. 6 mm x 1 cm gastric nodules in the mid body, biopsy showed chronic gastritis the duodenal biopsies negative for celiac disease. Also had negative celiac disease serologies. large mouth duodenal diverticulum. One additional duodenal diverticula in the second portion  . Coronary artery bypass graft 2003    5 vessel    [id number]:[id number] of systems complete and found to be negative unless listed above PHYSICAL EXAM BP 140/68  Pulse 95  Ht 5\' 4"  (1.626 m)  Wt 287 lb (130.182 kg)  BMI 49.26 kg/m2  SpO2 96% General: Well developed, well nourished, in no acute distress Head: Eyes PERRLA, No xanthomas.   Normal cephalic and atramatic  Lungs: Clear bilaterally to auscultation and percussion. Heart: HRRR S1 S2, distant heart  sounds.  Pulses are 2+ & equal.            No carotid bruit. No JVD.  No abdominal bruits. No femoral bruits. Abdomen: Bowel sounds are positive, abdomen soft and non-tender without masses or                  Hernia's noted. Msk:  Back mild kyphosis, slow/normal gait. Normal strength and tone for age. Extremities: No clubbing, cyanosis or edema.  DP +1 Neuro: Alert and oriented X 3. Psych:  Good affect, responds appropriately  EKG:NSR rate of 83 bpm, Twave abnormality inferiorly, anterolateral.  With inversion noted.  ASSESSMENT AND PLAN

## 2011-04-19 NOTE — Assessment & Plan Note (Signed)
She is followed by Dr. Lubertha South for this and has blood work drawn this month.  She is to continue crestor as directed.

## 2011-04-22 NOTE — Progress Notes (Signed)
2009: 290 lbs.  Diarrhea most likely multifactorial: diabetic enteropathy, IBS, and bile salt induced diarrhea.Pt did not complete labs. HBT for SIBO.

## 2011-04-29 ENCOUNTER — Encounter (HOSPITAL_COMMUNITY)
Admission: RE | Admit: 2011-04-29 | Discharge: 2011-04-29 | Disposition: A | Discharge status: Home / Self Care | Payer: BC Managed Care – PPO | Source: Ambulatory Visit | Attending: Adult Health | Admitting: Adult Health

## 2011-04-29 ENCOUNTER — Ambulatory Visit (INDEPENDENT_AMBULATORY_CARE_PROVIDER_SITE_OTHER): Payer: BC Managed Care – PPO | Admitting: *Deleted

## 2011-04-29 ENCOUNTER — Encounter (HOSPITAL_COMMUNITY): Payer: Self-pay | Admitting: Cardiology

## 2011-04-29 DIAGNOSIS — R5383 Other fatigue: Secondary | ICD-10-CM | POA: Insufficient documentation

## 2011-04-29 DIAGNOSIS — I2510 Atherosclerotic heart disease of native coronary artery without angina pectoris: Secondary | ICD-10-CM | POA: Insufficient documentation

## 2011-04-29 DIAGNOSIS — R0609 Other forms of dyspnea: Secondary | ICD-10-CM | POA: Insufficient documentation

## 2011-04-29 DIAGNOSIS — R5381 Other malaise: Secondary | ICD-10-CM | POA: Insufficient documentation

## 2011-04-29 DIAGNOSIS — R0989 Other specified symptoms and signs involving the circulatory and respiratory systems: Secondary | ICD-10-CM | POA: Insufficient documentation

## 2011-04-29 MED ORDER — TECHNETIUM TC 99M TETROFOSMIN IV KIT
30.0000 | PACK | Freq: Once | INTRAVENOUS | Status: AC | PRN
  Administered 2011-04-29: 29 via INTRAVENOUS

## 2011-04-29 NOTE — Progress Notes (Signed)
Please find out if patient had labs done at PCP. Stated she had labs done the date of OV 04/13/11. Please schedule HBT for bacterial overgrowth.

## 2011-04-29 NOTE — Progress Notes (Addendum)
Stress Lab Nurses Notes - [facility]  SARISSA DERN 04/29/2011  Reason for doing test: CAD  Type of test: Eugenie Birks Myoview  Nurse performing test: Parke Poisson, RN  Nuclear Medicine Tech: Lou Cal  Echo Tech: Not Applicable  MD performing test: Ival Bible, MD & Joni Reining, NP  Family MD: Lubertha South  Test explained and consent signed: yes  IV started: 22g jelco, Saline lock flushed, No redness or edema and Saline lock started in radiology  Symptoms: SOB   Treatment/Intervention: None  Reason test stopped: protocol completed  After recovery IV was: Discontinued via X-ray tech and No redness or edema  Patient to return to Nuc. Med at :11:30  Patient discharged: Home  Patient's Condition upon discharge was: stable  Comments:  Post exercise BP 118/60 & HR 90.  Symptoms resolved in recovery.  Erskine Speed T

## 2011-04-30 ENCOUNTER — Encounter (HOSPITAL_COMMUNITY)
Admission: RE | Admit: 2011-04-30 | Discharge: 2011-04-30 | Disposition: A | Discharge status: Home / Self Care | Payer: BC Managed Care – PPO | Source: Ambulatory Visit | Attending: Adult Health | Admitting: Adult Health

## 2011-04-30 MED ORDER — TECHNETIUM TC 99M TETROFOSMIN IV KIT
25.0000 | PACK | Freq: Once | INTRAVENOUS | Status: AC | PRN
  Administered 2011-04-30: 25.4 via INTRAVENOUS

## 2011-05-07 ENCOUNTER — Encounter: Payer: Self-pay | Admitting: Adult Health

## 2011-05-07 ENCOUNTER — Ambulatory Visit (INDEPENDENT_AMBULATORY_CARE_PROVIDER_SITE_OTHER): Payer: BC Managed Care – PPO | Admitting: Adult Health

## 2011-05-07 DIAGNOSIS — I2510 Atherosclerotic heart disease of native coronary artery without angina pectoris: Secondary | ICD-10-CM

## 2011-05-07 DIAGNOSIS — E7800 Pure hypercholesterolemia, unspecified: Secondary | ICD-10-CM

## 2011-05-07 MED ORDER — NITROGLYCERIN 0.4 MG SL SUBL: 0.4000 mg | SUBLINGUAL_TABLET | SUBLINGUAL | Status: DC | PRN

## 2011-05-07 NOTE — Assessment & Plan Note (Signed)
Review of stress myoview demonstrated: "Porbably negative stress nuclear myocardial study revealing normal left ventricular size and function and small perfusion defect most likely reflecting variable soft tissue attenuation; however, the possibility of a small area of ischemia must be considered.  Overall, this is a low risk study. (per Rothbart).  I have discussed the results with the patient and her husband.  Based upon the study she is low risk and therefore do not feel catheterization is necessary.  She is given Rx for NTG to use should she have severe chest discomfort in the setting of known CAD.  She is going to follow-up with PCP for continued assessment of her breathing status, with recommendations to be seen by pulmonologist should they feel this is necessary. We will see her in 6 months. Copy of stress test is given to the patient.

## 2011-05-07 NOTE — Progress Notes (Signed)
HPI:  60 y/o patient of [name] we are seeing for ongoing assessment and treatment of CAD with history of CABG(5 vessel in 2001), diabetes, hypothyroid and asthma. On last visit she complained of chest pressure and dyspnea. I ordered a stress myoview to assess for ischemia in the setting of known CAD.  She is here to discuss results. She continues complaints of dyspnea and some chest congestion with productive cough.  No Known Allergies  Current Outpatient Prescriptions  Medication Sig Dispense Refill  . albuterol (PROVENTIL) (2.5 MG/3ML) 0.083% nebulizer solution Take 2.5 mg by nebulization every 6 (six) hours as needed.        . ALPRAZolam (XANAX) 0.5 MG tablet 0.5 mg at bedtime as needed. Usually about every third night.      [name] aspirin 325 MG tablet Take 325 mg by mouth daily.        . CRESTOR 40 MG tablet 40 mg daily.       . diphenoxylate-atropine (LOMOTIL) 2.5-0.025 MG per tablet as needed.       . furosemide (LASIX) 40 MG tablet Take 80 mg by mouth daily.       [name] glyBURIDE (DIABETA) 5 MG tablet 5 mg 2 (two) times daily.       [name] ibuprofen (ADVIL,MOTRIN) 200 MG tablet Take 200 mg by mouth. Takes 3 at a time at tid      . insulin glargine (LANTUS) 100 UNIT/ML injection Inject 32 Units into the skin 2 (two) times daily.       . insulin lispro (HUMALOG) 100 UNIT/ML injection Inject into the skin 3 (three) times daily before meals. Sliding scale      . latanoprost (XALATAN) 0.005 % ophthalmic solution       . levothyroxine (SYNTHROID, LEVOTHROID) 112 MCG tablet 112 mcg daily.       [name] lisinopril (PRINIVIL,ZESTRIL) 20 MG tablet 20 mg daily.       . metFORMIN (GLUCOPHAGE) 500 MG tablet 500 mg 2 (two) times daily.       . metoprolol succinate (TOPROL-XL) 25 MG 24 hr tablet 25 mg daily.       . ONE TOUCH ULTRA TEST test strip       . pantoprazole (PROTONIX) 40 MG tablet 40 mg daily as needed.       . potassium chloride SA (K-DUR,KLOR-CON) 20 MEQ tablet Take 20 mEq by mouth 2 (two) times  daily.        . nitroGLYCERIN (NITROSTAT) 0.4 MG SL tablet Place 1 tablet (0.4 mg total) under the tongue every 5 (five) minutes as needed for chest pain.  25 tablet  3    Past Medical History  Diagnosis Date  . Diabetes mellitus   . Hypothyroidism   . Glaucoma   . Coronary artery disease   . Hypercholesterolemia   . Hypertension   . Depression   . Anxiety   . Morbid obesity   . Irritable bowel syndrome   . Chronic diarrhea     Negative workup for microscopic colitis and Celiac disease in 2009.  . Asthma     Past Surgical History  Procedure Date  . Partial hysterectomy 1990s  . Cholecystectomy 1990s  . Tubal ligation 1978  . Shoulder surgery   . Ileocolonoscopy October 2009    Normal terminal ileum, greater sigmoid colon diverticula, no polyps. Random biopsies negative for microscopic colitis.  . Esophagogastroduodenoscopy October 2009     mild hypertrophy of the gastric mucosa seen in the body  with mild erythema seen in the antrum appeared biopsy negative for H. pylori. 6 mm x 1 cm gastric nodules in the mid body, biopsy showed chronic gastritis the duodenal biopsies negative for celiac disease. Also had negative celiac disease serologies. large mouth duodenal diverticulum. One additional duodenal diverticula in the second portion  . Coronary artery bypass graft 2003    5 vessel  . Cataract extraction     [id number] of systems complete and found to be negative unless listed above PHYSICAL EXAM BP 133/74  Pulse 90  Resp 18  Ht 5\' 2"  (1.575 m)  Wt 282 lb 1.9 oz (127.969 kg)  BMI 51.60 kg/m2  SpO2 96%  General: Well developed, well nourished, in no acute distress Lungs: Clear bilaterally to auscultation and percussion. Heart: HRRR S1 S2,.  Pulses are 2+ & equal.            No carotid bruit. No JVD.  No abdominal bruits. No femoral bruits. Neuro: Alert and oriented X 3. Psych:  Good affect, responds appropriately   ASSESSMENT AND PLAN

## 2011-05-07 NOTE — Assessment & Plan Note (Signed)
Followed by PCP

## 2011-05-07 NOTE — Patient Instructions (Signed)
Your physician recommends that you schedule a follow-up appointment in: 6 months Your physician has recommended you make the following change in your medication: nitroglycerin 0.4mg  under tongue every 5 minutes x3 as needed for pain if unrelieved then go to ED

## 2011-05-10 NOTE — Progress Notes (Signed)
LMOM for pt to call. 

## 2011-05-11 ENCOUNTER — Other Ambulatory Visit: Payer: Self-pay | Admitting: Gastroenterology

## 2011-05-11 DIAGNOSIS — K529 Noninfective gastroenteritis and colitis, unspecified: Secondary | ICD-10-CM

## 2011-05-11 NOTE — Progress Notes (Signed)
Called pt. She said she did have labs done end of July for Dr. Lubertha South. Pt is aware HBT to be scheduled.  Called [facility] and spoke with Menicia in customer service. She will fax the labs over.

## 2011-05-11 NOTE — Progress Notes (Signed)
Pt is scheduled for HBT on 05/24/11- she is aware

## 2011-05-13 NOTE — Progress Notes (Signed)
Quick Note:  Labs look good. HBT as planned. ______

## 2011-05-14 NOTE — Progress Notes (Signed)
Pt informed. HBT scheduled for 05/24/2011.

## 2011-06-01 ENCOUNTER — Encounter (HOSPITAL_COMMUNITY): Payer: Self-pay | Admitting: *Deleted

## 2011-06-01 ENCOUNTER — Ambulatory Visit (HOSPITAL_COMMUNITY)
Admission: RE | Admit: 2011-06-01 | Discharge: 2011-06-01 | Disposition: A | Discharge status: Home / Self Care | Payer: BC Managed Care – PPO | Source: Ambulatory Visit | Attending: Gastroenterology | Admitting: Gastroenterology

## 2011-06-01 ENCOUNTER — Encounter (HOSPITAL_COMMUNITY)
Admission: RE | Disposition: A | Discharge status: Home / Self Care | Payer: Self-pay | Source: Ambulatory Visit | Attending: Gastroenterology

## 2011-06-01 DIAGNOSIS — R197 Diarrhea, unspecified: Secondary | ICD-10-CM

## 2011-06-01 DIAGNOSIS — Z01812 Encounter for preprocedural laboratory examination: Secondary | ICD-10-CM | POA: Insufficient documentation

## 2011-06-01 DIAGNOSIS — E119 Type 2 diabetes mellitus without complications: Secondary | ICD-10-CM | POA: Insufficient documentation

## 2011-06-01 HISTORY — PX: HYDROGEN BREATH TEST: SHX5529

## 2011-06-01 SURGERY — HYDROGEN BREATH TEST
Anesthesia: LOCAL

## 2011-06-01 MED ORDER — LACTULOSE 10 GM/15ML PO SOLN
ORAL | Status: AC
  Administered 2011-06-01: 20 g via ORAL
  Filled 2011-06-01: qty 60

## 2011-06-01 NOTE — Progress Notes (Signed)
Pt arrived to Short Stay for HBT. Denied consumption of beans, brans, or high fiber past 24 hrs. Verified npo  Past 12 hrs.Denied smoking, sleeping, or exercising past 30 min. Denied antibiotic use or diarrhea past 24 hrs. Agreeable to procedure. BS 306 . To frequently check BS during procedure.

## 2011-06-01 NOTE — Progress Notes (Signed)
Procedure complete. Pt discharged.

## 2011-06-01 NOTE — Progress Notes (Signed)
BS 322

## 2011-06-01 NOTE — Progress Notes (Signed)
BS: 334

## 2011-06-08 ENCOUNTER — Encounter (HOSPITAL_COMMUNITY): Payer: Self-pay | Admitting: Gastroenterology

## 2011-06-09 ENCOUNTER — Telehealth: Payer: Self-pay | Admitting: Gastroenterology

## 2011-06-09 NOTE — Brief Op Note (Signed)
06/01/2011  1:28 PM  PATIENT:  [name]  60 y.o. female  PRE-OPERATIVE DIAGNOSIS:  chronic diarrhea  POST-OPERATIVE DIAGNOSIS:  Small Bowel Bacterial Overgrowth  PROCEDURE:  Procedure(s): HYDROGEN BREATH TEST  SURGEON:  Surgeon(s): Arlyce Harman, MD  MEDICATIONS USED:  LACTULOSE  FINDINGS: BREATHALYZER- O MIN( 4 0745), FIRST PEAK (30 PPM, 0930), TROUGH (9 PPM 1015), SECOND PEAK (26 PPM, 1030)  DIAGNOSIS: SMALL BOWEL BACTERIAL OVERGROWTH. Differential diagnosis includes colonic reading caused 2nd peak.  PLAN OF CARE:  1. CIP/FLAG for 7 days. 2. Follow up in [facility] 2013

## 2011-06-09 NOTE — Telephone Encounter (Signed)
Called, many rings and no answer.

## 2011-06-09 NOTE — Telephone Encounter (Signed)
Please call pt. Her test shows she may have the wrong bacteria mix in her small bowel, which can cause diarrhea and low blood count. She needs antibiotics, which can help her diarrhea. She needs Cipro 500 mg and FLAG 500 mg BID for 5 days, #qs, rfx0, The meds can cause heel pain, nausea, vomiting, and weakness in the legs. The benefit of taking the meds outweigh the side effect risks of the medicine. Follow up in [facility] 2013.

## 2011-06-10 NOTE — Telephone Encounter (Signed)
LM for pt to call

## 2011-06-11 NOTE — Telephone Encounter (Signed)
Pt was informed of results on 06/10/2011. Meds called to [facility] at [facility] in [place] this AM. Called pt to let her know I just called them in. Many rings and no answer.

## 2011-06-14 LAB — GLUCOSE, CAPILLARY: Glucose-Capillary: 190 — ABNORMAL HIGH

## 2011-07-29 ENCOUNTER — Institutional Professional Consult (permissible substitution): Payer: BC Managed Care – PPO | Admitting: Emergency Medicine

## 2011-08-14 HISTORY — PX: CATARACT EXTRACTION W/ INTRAOCULAR LENS IMPLANT: SHX1309

## 2011-08-19 ENCOUNTER — Encounter: Payer: Self-pay | Admitting: Emergency Medicine

## 2011-08-19 ENCOUNTER — Ambulatory Visit (INDEPENDENT_AMBULATORY_CARE_PROVIDER_SITE_OTHER)
Admission: RE | Admit: 2011-08-19 | Discharge: 2011-08-19 | Disposition: A | Discharge status: Home / Self Care | Payer: BC Managed Care – PPO | Source: Ambulatory Visit | Attending: Emergency Medicine | Admitting: Emergency Medicine

## 2011-08-19 ENCOUNTER — Ambulatory Visit (INDEPENDENT_AMBULATORY_CARE_PROVIDER_SITE_OTHER): Payer: BC Managed Care – PPO | Admitting: Emergency Medicine

## 2011-08-19 VITALS — BP 140/72 | HR 88 | Temp 98.3°F | Ht 64.0 in | Wt 295.6 lb

## 2011-08-19 DIAGNOSIS — R0609 Other forms of dyspnea: Secondary | ICD-10-CM

## 2011-08-19 DIAGNOSIS — R0989 Other specified symptoms and signs involving the circulatory and respiratory systems: Secondary | ICD-10-CM

## 2011-08-19 NOTE — Assessment & Plan Note (Signed)
Etiology unclear - CXR from 3/12 reassuring, cardiac stress test reassuring. Had to stop today on walking oximetry after 1.5 laps but did not desat. Consider asthma or asthma-like syndrome, PAH (mutilfactorial but ? Undiagnosed OSA), but I suspect restriction from obesity is large component here.  - repeat full PFT - repeat CXR - walking oximetry without desat today - consider sleep study - consider TTE to eval for PAH and R heart fxn - rov next available.

## 2011-08-19 NOTE — Progress Notes (Signed)
Subjective:    Patient ID: [name], female    DOB: 1951-02-14, 60 y.o.   MRN: [id number]  HPI 61 yo never smoker, hx CAD s/p CABG '03, DM, chronic diarrhea and IBS. She has also been dx with asthma, she thinks about 5 yrs ago. She began to have excessive fatigue, difficulty breathing and walking, no cough. Admitted to [facility] 3/12 for this, was rx for possible PNA, given prednisone, improved but never back to baseline. Some occas wheezing but not always associated with her episodes of SOB.  No CP.   Myoview done 8/12 was reassuring >> normal LV size and fxn.    Review of Systems  Constitutional: Negative.  Negative for fever and unexpected weight change.  HENT: Negative.  Negative for ear pain, nosebleeds, congestion, sore throat, rhinorrhea, sneezing, trouble swallowing, dental problem, postnasal drip and sinus pressure.   Eyes: Negative.  Negative for redness and itching.  Respiratory: Positive for shortness of breath. Negative for cough, chest tightness and wheezing.   Cardiovascular: Negative for palpitations and leg swelling.  Gastrointestinal: Negative.  Negative for nausea and vomiting.  Genitourinary: Negative.  Negative for dysuria.  Musculoskeletal: Negative.  Negative for joint swelling.  Skin: Negative.  Negative for rash.  Neurological: Negative.  Negative for headaches.  Hematological: Negative.  Does not bruise/bleed easily.  Psychiatric/Behavioral: Negative.  Negative for dysphoric mood. The patient is not nervous/anxious.     Past Medical History  Diagnosis Date  . Diabetes mellitus   . Hypothyroidism   . Glaucoma   . Coronary artery disease   . Hypercholesterolemia   . Hypertension   . Depression   . Anxiety   . Morbid obesity   . Irritable bowel syndrome   . Chronic diarrhea     Negative workup for microscopic colitis and Celiac disease in 2009.  . Asthma      Family History  Problem Relation Age of Onset  . Cancer Mother 58    Unknown primary,  possibly pancreatic  . Diabetes Father     Possible heart attack  . Colon cancer Neg Hx   . Liver disease Neg Hx   . Heart disease Father      History   Social History  . Marital Status: Married    Spouse Name: N/A    Number of Children: 2  . Years of Education: N/A   Occupational History  . UNEMPLOYED   .     Social History Main Topics  . Smoking status: Never Smoker   . Smokeless tobacco: Not on file  . Alcohol Use: No  . Drug Use: No  . Sexually Active: Not on file   Other Topics Concern  . Not on file   Social History Narrative  . No narrative on file     No Known Allergies   Outpatient Prescriptions Prior to Visit  Medication Sig Dispense Refill  . ALPRAZolam (XANAX) 0.5 MG tablet 0.5 mg at bedtime as needed. Usually about every third night.      [name] aspirin 325 MG tablet Take 325 mg by mouth daily.        . CRESTOR 40 MG tablet 40 mg daily.       . diphenoxylate-atropine (LOMOTIL) 2.5-0.025 MG per tablet as needed.       . furosemide (LASIX) 40 MG tablet Take 80 mg by mouth daily.       [name] ibuprofen (ADVIL,MOTRIN) 200 MG tablet Takes 3 at a time at tid      .  insulin glargine (LANTUS) 100 UNIT/ML injection Inject 50 Units into the skin at bedtime.       . insulin lispro (HUMALOG) 100 UNIT/ML injection Inject into the skin. Sliding scale      . latanoprost (XALATAN) 0.005 % ophthalmic solution Place 1 drop into both eyes daily.       [name] levothyroxine (SYNTHROID, LEVOTHROID) 112 MCG tablet 112 mcg daily.       . metoprolol succinate (TOPROL-XL) 25 MG 24 hr tablet 25 mg daily.       . nitroGLYCERIN (NITROSTAT) 0.4 MG SL tablet Place 1 tablet (0.4 mg total) under the tongue every 5 (five) minutes as needed for chest pain.  25 tablet  3  . ONE TOUCH ULTRA TEST test strip       . pantoprazole (PROTONIX) 40 MG tablet 40 mg daily as needed.       . potassium chloride SA (K-DUR,KLOR-CON) 20 MEQ tablet Take 20 mEq by mouth daily.       [name] lisinopril (PRINIVIL,ZESTRIL) 20  MG tablet 20 mg daily.       [name] albuterol (PROVENTIL) (2.5 MG/3ML) 0.083% nebulizer solution Take 2.5 mg by nebulization every 6 (six) hours as needed.        . glyBURIDE (DIABETA) 5 MG tablet 5 mg 2 (two) times daily.       . metFORMIN (GLUCOPHAGE) 500 MG tablet 500 mg 2 (two) times daily.           Objective:   Physical Exam  Gen: Pleasant, obese, in no distress,  normal affect  ENT: No lesions,  mouth clear,  oropharynx clear, no postnasal drip  Neck: No JVD, no TMG, no carotid bruits  Lungs: No use of accessory muscles, good air movement, no wheeze or crackles  Cardiovascular: RRR, heart sounds normal, no murmur or gallops, 1+ pre-tibial edema.   Musculoskeletal: No deformities, no cyanosis or clubbing  Neuro: alert, non focal  Skin: Warm, no lesions or rashes     Assessment & Plan:  Dyspnea on exertion Etiology unclear - CXR from 3/12 reassuring, cardiac stress test reassuring. Had to stop today on walking oximetry after 1.5 laps but did not desat. Consider asthma or asthma-like syndrome, PAH (mutilfactorial but ? Undiagnosed OSA), but I suspect restriction from obesity is large component here.  - repeat full PFT - repeat CXR - walking oximetry without desat today - consider sleep study - consider TTE to eval for PAH and R heart fxn - rov next available.

## 2011-08-19 NOTE — Patient Instructions (Signed)
Walking oximetry today We will perform full pulmonary function testing at your next visit CXR today Follow up with Dr Delton Coombes next available with full PFT's We may consider a sleep study at some point in the future.

## 2011-09-01 ENCOUNTER — Ambulatory Visit: Payer: BC Managed Care – PPO | Admitting: Emergency Medicine

## 2011-10-04 ENCOUNTER — Ambulatory Visit (INDEPENDENT_AMBULATORY_CARE_PROVIDER_SITE_OTHER): Payer: BC Managed Care – PPO | Admitting: Emergency Medicine

## 2011-10-04 ENCOUNTER — Encounter: Payer: Self-pay | Admitting: Emergency Medicine

## 2011-10-04 DIAGNOSIS — R0609 Other forms of dyspnea: Secondary | ICD-10-CM

## 2011-10-04 LAB — PULMONARY FUNCTION TEST

## 2011-10-04 NOTE — Assessment & Plan Note (Signed)
PFT are surprisingly reassuring. With normal lung volumes, I don't believe I can blame her exertional SOB on just weight gain. Her spiro doesn't support asthma. ? Whether she has clinically significant CAD that wasn't picked up on her myoview in 8/12?? Her CABG was 10 yrs ago. I'd like for her to see cardiology again to make sure they don';t want her to have cardiac cath to answer the question definitively.

## 2011-10-04 NOTE — Progress Notes (Signed)
PFT was done today.  

## 2011-10-04 NOTE — Progress Notes (Signed)
  Subjective:    Patient ID: [name], female    DOB: [birth year], 61 y.o.   MRN: [id number]  HPI 61 yo never smoker, hx CAD s/p CABG '03, DM, chronic diarrhea and IBS. She has also been dx with asthma, she thinks about 5 yrs ago. She began to have excessive fatigue, difficulty breathing and walking, no cough. Admitted to [facility] 3/12 for this, was rx for possible PNA, given prednisone, improved but never back to baseline. Some occas wheezing but not always associated with her episodes of SOB.  No CP.   Myoview done 8/12 was reassuring >> normal LV size and fxn.   ROV 10/04/11 -- follow up for dyspnea, ? Asthma. She had PFT today w normal AF, normal volumes, normal adjusted DLCO. She has gained wt, but her lung volumes aren't significantly restricted. Surprisingly little evidence for asthma. She is still having fatigue and exertional SOB.      Objective:   Physical Exam  Gen: Pleasant, obese, in no distress,  normal affect  ENT: No lesions,  mouth clear,  oropharynx clear, no postnasal drip  Neck: No JVD, no TMG, no carotid bruits  Lungs: No use of accessory muscles, good air movement, no wheeze or crackles  Cardiovascular: RRR, heart sounds normal, no murmur or gallops, 1+ pre-tibial edema.   Musculoskeletal: No deformities, no cyanosis or clubbing  Neuro: alert, non focal  Skin: Warm, no lesions or rashes     Assessment & Plan:  Dyspnea on exertion PFT are surprisingly reassuring. With normal lung volumes, I don't believe I can blame her exertional SOB on just weight gain. Her spiro doesn't support asthma. ? Whether she has clinically significant CAD that wasn't picked up on her myoview in 8/12?? Her CABG was 10 yrs ago. I'd like for her to see cardiology again to make sure they don';t want her to have cardiac cath to answer the question definitively.

## 2011-10-04 NOTE — Patient Instructions (Signed)
Your pulmonary function testing today does not support asthma. Your shortness of breath and fatigue are more significant than can be explained by lung dysfunction.  Please arrange followup at [facility] in [place]. You may need a cardiac catherization.  I agree that your will benefit from a weight loss program.  Follow with Dr Delton Coombes as needed

## 2011-10-11 ENCOUNTER — Emergency Department (HOSPITAL_COMMUNITY): Payer: BC Managed Care – PPO

## 2011-10-11 ENCOUNTER — Encounter (HOSPITAL_COMMUNITY): Payer: Self-pay

## 2011-10-11 ENCOUNTER — Other Ambulatory Visit: Payer: Self-pay

## 2011-10-11 ENCOUNTER — Emergency Department (HOSPITAL_COMMUNITY)
Admission: EM | Admit: 2011-10-11 | Discharge: 2011-10-11 | Disposition: A | Discharge status: Home / Self Care | Payer: BC Managed Care – PPO | Attending: Emergency Medicine | Admitting: Emergency Medicine

## 2011-10-11 DIAGNOSIS — E7800 Pure hypercholesterolemia, unspecified: Secondary | ICD-10-CM | POA: Insufficient documentation

## 2011-10-11 DIAGNOSIS — R6889 Other general symptoms and signs: Secondary | ICD-10-CM | POA: Insufficient documentation

## 2011-10-11 DIAGNOSIS — R197 Diarrhea, unspecified: Secondary | ICD-10-CM | POA: Insufficient documentation

## 2011-10-11 DIAGNOSIS — R0600 Dyspnea, unspecified: Secondary | ICD-10-CM

## 2011-10-11 DIAGNOSIS — R0609 Other forms of dyspnea: Secondary | ICD-10-CM | POA: Insufficient documentation

## 2011-10-11 DIAGNOSIS — F341 Dysthymic disorder: Secondary | ICD-10-CM | POA: Insufficient documentation

## 2011-10-11 DIAGNOSIS — E039 Hypothyroidism, unspecified: Secondary | ICD-10-CM | POA: Insufficient documentation

## 2011-10-11 DIAGNOSIS — I2510 Atherosclerotic heart disease of native coronary artery without angina pectoris: Secondary | ICD-10-CM | POA: Insufficient documentation

## 2011-10-11 DIAGNOSIS — R0602 Shortness of breath: Secondary | ICD-10-CM | POA: Insufficient documentation

## 2011-10-11 DIAGNOSIS — E119 Type 2 diabetes mellitus without complications: Secondary | ICD-10-CM | POA: Insufficient documentation

## 2011-10-11 DIAGNOSIS — Z79899 Other long term (current) drug therapy: Secondary | ICD-10-CM | POA: Insufficient documentation

## 2011-10-11 DIAGNOSIS — R062 Wheezing: Secondary | ICD-10-CM | POA: Insufficient documentation

## 2011-10-11 DIAGNOSIS — R0989 Other specified symptoms and signs involving the circulatory and respiratory systems: Secondary | ICD-10-CM | POA: Insufficient documentation

## 2011-10-11 LAB — CBC
HCT: 39.7 % (ref 36.0–46.0)
Hemoglobin: 13.6 g/dL (ref 12.0–15.0)
MCH: 30.5 pg (ref 26.0–34.0)
MCHC: 34.3 g/dL (ref 30.0–36.0)
MCV: 89 fL (ref 78.0–100.0)
Platelets: 225 10*3/uL (ref 150–400)
RBC: 4.46 MIL/uL (ref 3.87–5.11)
RDW: 13 % (ref 11.5–15.5)
WBC: 6.7 10*3/uL (ref 4.0–10.5)

## 2011-10-11 LAB — PRO B NATRIURETIC PEPTIDE: Pro B Natriuretic peptide (BNP): 111.2 pg/mL (ref 0–125)

## 2011-10-11 LAB — BASIC METABOLIC PANEL
BUN: 9 mg/dL (ref 6–23)
CO2: 25 mEq/L (ref 19–32)
Calcium: 9.2 mg/dL (ref 8.4–10.5)
Chloride: 98 mEq/L (ref 96–112)
Creatinine, Ser: 0.58 mg/dL (ref 0.50–1.10)
GFR calc Af Amer: >90 mL/min (ref >90)
GFR calc non Af Amer: >90 mL/min (ref >90)
Glucose, Bld: 355 mg/dL — ABNORMAL HIGH (ref 70–99)
Potassium: 4.2 mEq/L (ref 3.5–5.1)
Sodium: 136 mEq/L (ref 135–145)

## 2011-10-11 LAB — POCT I-STAT TROPONIN I: Troponin i, poc: 0 ng/mL (ref 0.00–0.08)

## 2011-10-11 MED ORDER — PREDNISONE 20 MG PO TABS: 40.0000 mg | ORAL_TABLET | Freq: Every day | ORAL | Status: DC

## 2011-10-11 MED ORDER — IOHEXOL 350 MG/ML SOLN
80.0000 mL | Freq: Once | INTRAVENOUS | Status: AC | PRN
  Administered 2011-10-11: 80 mL via INTRAVENOUS

## 2011-10-11 NOTE — ED Provider Notes (Signed)
History    60yf with with sob. Has been coughing for past few days. Gradual onset. No fever or chills. Denies cp. No unusual leg pain or swelling. Denies hx of blood clot. No sick contacts. Denies tobacco use. No sore throat. Denies druse use.  CSN: [id number]  Arrival date & time 10/11/11  1211   First MD Initiated Contact with Patient 10/11/11 1323      Chief Complaint  Patient presents with  . Wheezing    (Consider location/radiation/quality/duration/timing/severity/associated sxs/prior treatment) HPI  Past Medical History  Diagnosis Date  . Diabetes mellitus   . Hypothyroidism   . Glaucoma   . Coronary artery disease   . Hypercholesterolemia   . Hypertension   . Depression   . Anxiety   . Morbid obesity   . Irritable bowel syndrome   . Chronic diarrhea     Negative workup for microscopic colitis and Celiac disease in 2009.  . Asthma     Past Surgical History  Procedure Date  . Partial hysterectomy 1990s  . Cholecystectomy 1990s  . Tubal ligation 1978  . Shoulder surgery   . Ileocolonoscopy October 2009    Normal terminal ileum, greater sigmoid colon diverticula, no polyps. Random biopsies negative for microscopic colitis.  . Esophagogastroduodenoscopy October 2009     mild hypertrophy of the gastric mucosa seen in the body with mild erythema seen in the antrum appeared biopsy negative for H. pylori. 6 mm x 1 cm gastric nodules in the mid body, biopsy showed chronic gastritis the duodenal biopsies negative for celiac disease. Also had negative celiac disease serologies. large mouth duodenal diverticulum. One additional duodenal diverticula in the second portion  . Coronary artery bypass graft 2003    5 vessel  . Cataract extraction   . Hydrogen breath test 06/01/2011    Procedure: HYDROGEN BREATH TEST;  Surgeon: Arlyce Harman, MD;  Location: [facility];  Service: Endoscopy;  Laterality: N/A;  7:30/ for Bacterial Overgrowth    Family History  Problem Relation  Age of Onset  . Cancer Mother 57    Unknown primary, possibly pancreatic  . Diabetes Father     Possible heart attack  . Colon cancer Neg Hx   . Liver disease Neg Hx   . Heart disease Father     History  Substance Use Topics  . Smoking status: Never Smoker   . Smokeless tobacco: Not on file  . Alcohol Use: No    OB History    Grav Para Term Preterm Abortions TAB SAB Ect Mult Living                  Review of Systems   Review of symptoms negative unless otherwise noted in HPI.   Allergies  Review of patient's allergies indicates no known allergies.  Home Medications   Current Outpatient Rx  Name Route Sig Dispense Refill  . ALBUTEROL SULFATE HFA 108 (90 BASE) MCG/ACT IN AERS Inhalation Inhale 3 puffs into the lungs 3 (three) times daily.      . ALBUTEROL SULFATE (2.5 MG/3ML) 0.083% IN NEBU Nebulization Take 2.5 mg by nebulization every 6 (six) hours as needed.      . ALPRAZOLAM 0.5 MG PO TABS Oral Take 0.5 mg by mouth at bedtime as needed. Usually about every third night.    . ASPIRIN 325 MG PO TABS Oral Take 325 mg by mouth daily.      . CRESTOR 40 MG PO TABS Oral Take 40 mg  by mouth daily.     [name] DIPHENOXYLATE-ATROPINE 2.5-0.025 MG PO TABS Oral Take 1 tablet by mouth 4 (four) times daily as needed. For loose bowels    . FLUTICASONE-SALMETEROL 250-50 MCG/DOSE IN AEPB Inhalation Inhale 1 puff into the lungs every 12 (twelve) hours.      . FUROSEMIDE 40 MG PO TABS Oral Take 80 mg by mouth daily.     . IBUPROFEN 200 MG PO TABS Oral Take 600 mg by mouth every 8 (eight) hours as needed. For pain    . INSULIN GLARGINE 100 UNIT/ML [place] SOLN Subcutaneous Inject 50 Units into the skin at bedtime.     . INSULIN LISPRO (HUMAN) 100 UNIT/ML [place] SOLN Subcutaneous Inject into the skin. Sliding scale    . LATANOPROST 0.005 % OP SOLN Both Eyes Place 1 drop into both eyes daily.     [name] LEVOTHYROXINE SODIUM 112 MCG PO TABS Oral Take 112 mcg by mouth daily.     [name] LISINOPRIL 10 MG PO TABS Oral  Take 10 mg by mouth daily.      [name] METFORMIN HCL 1000 MG PO TABS Oral Take 1,000 mg by mouth 2 (two) times daily with a meal.      . METOPROLOL SUCCINATE ER 25 MG PO TB24 Oral Take 25 mg by mouth daily.     [name] NITROGLYCERIN 0.4 MG SL SUBL Sublingual Place 1 tablet (0.4 mg total) under the tongue every 5 (five) minutes as needed for chest pain. 25 tablet 3  . PANTOPRAZOLE SODIUM 40 MG PO TBEC Oral Take 40 mg by mouth daily as needed. For acid reflux    . POTASSIUM CHLORIDE CRYS ER 20 MEQ PO TBCR Oral Take 20 mEq by mouth daily.     Letta Pate ULTRA BLUE VI STRP        BP 130/70  Pulse 96  Temp(Src) 98.5 F (36.9 C) (Oral)  Resp 20  SpO2 96%  Physical Exam  Nursing note and vitals reviewed. Constitutional: She appears well-developed and well-nourished. No distress.       Laying in bed. No acute distress. Obese.  HENT:  Head: Normocephalic and atraumatic.  Eyes: Conjunctivae are normal. Pupils are equal, round, and reactive to light. Right eye exhibits no discharge. Left eye exhibits no discharge.  Neck: Neck supple.  Cardiovascular: Normal rate and normal heart sounds.  Exam reveals no gallop and no friction rub.   No murmur heard.      Mild tachycardia.  Pulmonary/Chest: Effort normal and breath sounds normal. No respiratory distress. She has no wheezes.  Abdominal: Soft. She exhibits no distension. There is no tenderness.  Musculoskeletal: She exhibits no edema and no tenderness.  Neurological: She is alert.  Skin: Skin is warm and dry.  Psychiatric: She has a normal mood and affect. Her behavior is normal. Thought content normal.    ED Course  Procedures (including critical care time)  Labs Reviewed  BASIC METABOLIC PANEL - Abnormal; Notable for the following:    Glucose, Bld 355 (*)    All other components within normal limits  PRO B NATRIURETIC PEPTIDE  CBC  POCT I-STAT TROPONIN I  LAB REPORT - SCANNED   Dg Chest 2 View  10/11/2011  *RADIOLOGY REPORT*  Clinical Data:  Wheezing.  Shortness of breath.  CHEST - 2 VIEW  Comparison: 08/19/2011  Findings: Evidence of prior CABG.  Heart size and vascularity are normal.  Prominent right pericardial fat pad as demonstrated on prior CT dated 08/14/2008.  Minimal scarring in the left midzone. No acute osseous abnormality.  Reversal of the lower thoracic kyphosis is chronic.  IMPRESSION: No acute abnormalities.  Original Report Authenticated By: Gwynn Burly, M.D.   Ct Angio Chest W/cm &/or Wo Cm  10/11/2011  *RADIOLOGY REPORT*  Clinical Data: Pulmonary embolus.  Wheezing and short of breath.  CT ANGIOGRAPHY CHEST  Technique:  Multidetector CT imaging of the chest using the standard protocol during bolus administration of intravenous contrast. Multiplanar reconstructed images including MIPs were obtained and reviewed to evaluate the vascular anatomy.  Contrast: 80mL OMNIPAQUE IOHEXOL 350 MG/ML IV SOLN  Comparison: 10/11/2011.  Findings: Mediastinal lipomatosis.  Postop changes of CABG. Technically adequate study.  No pulmonary embolus.  Scattered areas of ground-glass attenuation are present in the lungs.  Left upper lobe scarring.  No pericardial or pleural effusion is present. Incidental imaging the upper abdomen is normal.  Cholecystectomy. Thoracic spine DISH.  No aggressive osseous lesions.  IMPRESSION: Technically adequate study without pulmonary embolus.  No acute abnormality.  Postoperative changes of CABG.  Original Report Authenticated By: Andreas Newport, M.D.   EKG:  Rhythm: Normal sinus Rate: 96 Axis: Normal Intervals: Mild QT prolongation. Corrected QT 485 ms ST segments: Slight ST depression in the inferior and lateral leads. This is noted on prior EKG. Comparison: (11/23/2010) no significant change    1. Dyspnea       MDM  61 year old female with cough. Workup fairly unremarkable. CT angiography of the chest without any evidence of pulmonary embolism or other acute abnormality. EKG with no significant  change from prior. Troponin is normal. Suspect viral URI. Patient is afebrile and well appearing.         Raeford Razor, MD 10/22/11 [phone]

## 2011-10-11 NOTE — ED Notes (Signed)
Pt. Was sent to [place] from [facility] for increased wheezing and sob.  Pt. Denies any chest pain .No s/s of resp. distress

## 2011-10-12 ENCOUNTER — Encounter: Payer: Self-pay | Admitting: *Deleted

## 2011-10-12 ENCOUNTER — Encounter: Payer: Self-pay | Admitting: Adult Health

## 2011-10-12 ENCOUNTER — Ambulatory Visit (INDEPENDENT_AMBULATORY_CARE_PROVIDER_SITE_OTHER): Payer: BC Managed Care – PPO | Admitting: Adult Health

## 2011-10-12 DIAGNOSIS — R0609 Other forms of dyspnea: Secondary | ICD-10-CM

## 2011-10-12 DIAGNOSIS — R0600 Dyspnea, unspecified: Secondary | ICD-10-CM

## 2011-10-12 DIAGNOSIS — I2510 Atherosclerotic heart disease of native coronary artery without angina pectoris: Secondary | ICD-10-CM

## 2011-10-12 LAB — PROTIME-INR: Prothrombin Time: 13.1 seconds (ref 11.6–15.2)

## 2011-10-12 LAB — APTT: aPTT: 28 seconds (ref 24–37)

## 2011-10-12 NOTE — Progress Notes (Signed)
 HPI: [name] is a 60 y/o morbidly obese patient of Dr. Rothbart we are following for ongoing assessment and treatment of CAD, 5 vessel CABG in 2003) hypercholesterolemia. She also has history of COPD, diabetes, and hypothyroidism. She comes today after being seen in the ER for severe dyspnea after initially seeing her PCP. She was ruled out for PE, MI and CHF. She has been seen by Dr. Byrum, pulmonologist to discuss PFT;s which were completed on 10/04/11. He found no pulmonary reason for her symptoms. He advised her to see cardiology with recommendations for cardiac catheterization. She has had a stress myoview completed in August of 2012 which was negative for ischemia, with normal LV systolic fix. She states that her breathing is so bad that she cannot walk 25 ft without having to stop. She is unable to clean her house or complete ADL:s without having to stop and rest often. Denies chest pain, but has pressure in her chest. No LEE, or nausea. She has frequent diaphoresis. She wishes to proceed with cardiac catheterization.She requests radial access as she states it is difficult to access her femoral sites.   No Known Allergies  Current Outpatient Prescriptions  Medication Sig Dispense Refill  . albuterol (PROAIR HFA) 108 (90 BASE) MCG/ACT inhaler Inhale 3 puffs into the lungs 3 (three) times daily.        . albuterol (PROVENTIL) (2.5 MG/3ML) 0.083% nebulizer solution Take 2.5 mg by nebulization every 6 (six) hours as needed.        . ALPRAZolam (XANAX) 0.5 MG tablet Take 0.5 mg by mouth at bedtime as needed. Usually about every third night.      . aspirin 325 MG tablet Take 325 mg by mouth daily.        . CRESTOR 40 MG tablet Take 40 mg by mouth daily.       . diphenoxylate-atropine (LOMOTIL) 2.5-0.025 MG per tablet Take 1 tablet by mouth 4 (four) times daily as needed. For loose bowels      . Fluticasone-Salmeterol (ADVAIR) 250-50 MCG/DOSE AEPB Inhale 1 puff into the lungs every 12 (twelve)  hours.        . furosemide (LASIX) 40 MG tablet Take 80 mg by mouth daily.       . ibuprofen (ADVIL,MOTRIN) 200 MG tablet Take 600 mg by mouth every 8 (eight) hours as needed. For pain      . insulin glargine (LANTUS) 100 UNIT/ML injection Inject 50 Units into the skin at bedtime.       . insulin lispro (HUMALOG) 100 UNIT/ML injection Inject into the skin. Sliding scale      . latanoprost (XALATAN) 0.005 % ophthalmic solution Place 1 drop into both eyes daily.       . levothyroxine (SYNTHROID, LEVOTHROID) 112 MCG tablet Take 112 mcg by mouth daily.       . Liraglutide (VICTOZA [place]) Inject into the skin.      . Liraglutide (VICTOZA) 18 MG/3ML SOLN Inject 1.2 mg into the skin daily.      . lisinopril (PRINIVIL,ZESTRIL) 10 MG tablet Take 10 mg by mouth daily.        . metFORMIN (GLUCOPHAGE) 1000 MG tablet Take 1,000 mg by mouth 2 (two) times daily with a meal.        . metoprolol succinate (TOPROL-XL) 25 MG 24 hr tablet Take 25 mg by mouth daily.       . nitroGLYCERIN (NITROSTAT) 0.4 MG SL tablet Place 1 tablet (0.4 mg total)   under the tongue every 5 (five) minutes as needed for chest pain.  25 tablet  3  . ONE TOUCH ULTRA TEST test strip       . pantoprazole (PROTONIX) 40 MG tablet Take 40 mg by mouth daily as needed. For acid reflux      . potassium chloride SA (K-DUR,KLOR-CON) 20 MEQ tablet Take 20 mEq by mouth daily.       . predniSONE (DELTASONE) 20 MG tablet Take 2 tablets (40 mg total) by mouth daily.  10 tablet  0   No current facility-administered medications for this visit.   Facility-Administered Medications Ordered in Other Visits  Medication Dose Route Frequency Provider Last Rate Last Dose  . iohexol (OMNIPAQUE) 350 MG/ML injection 80 mL  80 mL Intravenous Once PRN Medication Radiologist, MD   80 mL at 10/11/11 1548    Past Medical History  Diagnosis Date  . Diabetes mellitus   . Hypothyroidism   . Glaucoma   . Coronary artery disease   . Hypercholesterolemia   .  Hypertension   . Depression   . Anxiety   . Morbid obesity   . Irritable bowel syndrome   . Chronic diarrhea     Negative workup for microscopic colitis and Celiac disease in 2009.  . Asthma     Past Surgical History  Procedure Date  . Partial hysterectomy 1990s  . Cholecystectomy 1990s  . Tubal ligation 1978  . Shoulder surgery   . Ileocolonoscopy October 2009    Normal terminal ileum, greater sigmoid colon diverticula, no polyps. Random biopsies negative for microscopic colitis.  . Esophagogastroduodenoscopy October 2009     mild hypertrophy of the gastric mucosa seen in the body with mild erythema seen in the antrum appeared biopsy negative for H. pylori. 6 mm x 1 cm gastric nodules in the mid body, biopsy showed chronic gastritis the duodenal biopsies negative for celiac disease. Also had negative celiac disease serologies. large mouth duodenal diverticulum. One additional duodenal diverticula in the second portion  . Coronary artery bypass graft 2003    5 vessel  . Cataract extraction   . Hydrogen breath test 06/01/2011    Procedure: HYDROGEN BREATH TEST;  Surgeon: Sandi M Fields, MD;  Location: [facility];  Service: Endoscopy;  Laterality: N/A;  7:30/ for Bacterial Overgrowth    Family History  Problem Relation Age of Onset  . Cancer Mother 65    Unknown primary, possibly pancreatic  . Diabetes Father     Possible heart attack  . Colon cancer Neg Hx   . Liver disease Neg Hx   . Heart disease Father     History   Social History  . Marital Status: Married    Spouse Name: N/A    Number of Children: 2  . Years of Education: N/A   Occupational History  . UNEMPLOYED   .     Social History Main Topics  . Smoking status: Never Smoker   . Smokeless tobacco: Not on file  . Alcohol Use: No  . Drug Use: No  . Sexually Active: Not on file   Other Topics Concern  . Not on file   Social History Narrative  . No narrative on file    ROS:Review of systems  complete and found to be negative unless listed abovex  PHYSICAL EXAM BP 146/81  Pulse 90  Ht 5' 4" (1.626 m)  Wt 275 lb (124.739 kg)  BMI 47.20 kg/m2  General: Well developed, well nourished,   in no acute distress, morbidly obese. Head: Eyes PERRLA, No xanthomas.   Normal cephalic and atramatic  Lungs:  Inspiratory wheezes with bibasilar crackles. Heart: HRRR S1 S2, tachycardic .  Pulses are 2+ & equal.            No carotid bruit. No JVD.  No abdominal bruits. No femoral bruits. Abdomen: Bowel sounds are positive, abdomen soft, very obese and non-tender without masses or                  Hernia's noted. Msk:  Back normal, normal gait. Normal strength and tone for age. Extremities: No clubbing, cyanosis or edema.  DP +1 Neuro: Alert and oriented X 3. Psych:  Good affect, responds appropriately  EKG: NSR with ST-T wave abnormality anteriorly and flattening in the lateral leads.  ASSESSMENT AND PLAN   

## 2011-10-12 NOTE — Patient Instructions (Signed)
Your physician recommends that you schedule a follow-up appointment in: After Cardiac Cath  Your physician has requested that you have a cardiac catheterization. Cardiac catheterization is used to diagnose and/or treat various heart conditions. Doctors may recommend this procedure for a number of different reasons. The most common reason is to evaluate chest pain. Chest pain can be a symptom of coronary artery disease (CAD), and cardiac catheterization can show whether plaque is narrowing or blocking your heart's arteries. This procedure is also used to evaluate the valves, as well as measure the blood flow and oxygen levels in different parts of your heart. For further information please visit [email or web address] Please follow instruction sheet, as given.

## 2011-10-12 NOTE — Progress Notes (Signed)
Patient of [name] seen with Ms. Lawrence today for followup of progressive shortness of breath and exertional fatigue. She has a history of CAD status post CABG in 2003, underwent low risk Myoview in August 2012. More recently has undergone pulmonary function testing and followup with Dr. Delton Coombes. Based on his note and testing, clear pulmonary etiology for the patient's symptoms was not noted. There was recommendation to consider further cardiology evaluation, possibly with cardiac catheterization.  She was seen in the ER recently with similar symptoms, troponin level normal. Recent ECGs reviewed including one from today, showing nonspecific ST-T changes. Recent BNP 111. Chest CT angiogram was negative for pulmonary embolus, showed some left upper lobe scarring with no pleural effusion or infiltrates.  She is comfortable on examination today, no active chest pain or breathlessness at rest. Vital signs reviewed. Lungs clear with decreased breath sounds no wheezing, cardiac exam without rub or gallop.  She is here with her significant other, reports continued problems with shortness of breath and fatigue, some atypical chest pains. Situation was reviewed, and discussion had regarding arrangement of a left and right heart catheterization later this week for clear definition of her native and bypass graft anatomy, also assessment of hemodynamics. After discussion of the risks and benefits, she was in agreement to proceed. She also voiced comfort with plan for outpatient evaluation.  Of note, she did mention that she had had difficulty with previous catheterization from her groin site prior to CABG, citing issues with obtaining adequate pain control. One consideration would be to proceed from a right radial approach, and use an IJ approach for the venous access. Will leave this to the discretion of the catheterization physician, however.  Jonelle Sidle, M.D., F.A.C.C.

## 2011-10-12 NOTE — Assessment & Plan Note (Signed)
She continues to have symptoms of DOE, weakness, and fatigue.  She has been seen by Dr. Delton Coombes and was told her lung status does not explain her on going symptoms. She is 9 years post CABG, Despite normal stress myoview in August of 2012, symptoms persist. I have discussed cardiac catheterization with the patient, risks and benefits. She is recommended to have both right and left cardiac angiography.. She has been seen and examined by Dr. Diona Browner in clinic today as well. She is willing to proceed.She is concerned about difficulty of femoral access and requests radial access.

## 2011-10-14 ENCOUNTER — Other Ambulatory Visit: Payer: Self-pay | Admitting: *Deleted

## 2011-10-14 NOTE — Progress Notes (Signed)
Addended by: Marrion Coy L on: 10/14/2011 02:15 PM   Modules accepted: Orders

## 2011-10-15 ENCOUNTER — Inpatient Hospital Stay (HOSPITAL_BASED_OUTPATIENT_CLINIC_OR_DEPARTMENT_OTHER)
Admission: RE | Admit: 2011-10-15 | Discharge: 2011-10-15 | Disposition: A | Discharge status: Home / Self Care | Payer: BC Managed Care – PPO | Source: Ambulatory Visit | Attending: Cardiology | Admitting: Cardiology

## 2011-10-15 ENCOUNTER — Encounter (HOSPITAL_BASED_OUTPATIENT_CLINIC_OR_DEPARTMENT_OTHER)
Admission: RE | Disposition: A | Discharge status: Home / Self Care | Payer: Self-pay | Source: Ambulatory Visit | Attending: Cardiology

## 2011-10-15 ENCOUNTER — Encounter (HOSPITAL_COMMUNITY): Payer: Self-pay | Admitting: Pharmacy Technician

## 2011-10-15 DIAGNOSIS — R0989 Other specified symptoms and signs involving the circulatory and respiratory systems: Secondary | ICD-10-CM | POA: Insufficient documentation

## 2011-10-15 DIAGNOSIS — R0609 Other forms of dyspnea: Secondary | ICD-10-CM | POA: Insufficient documentation

## 2011-10-15 DIAGNOSIS — I25810 Atherosclerosis of coronary artery bypass graft(s) without angina pectoris: Secondary | ICD-10-CM | POA: Insufficient documentation

## 2011-10-15 DIAGNOSIS — I2510 Atherosclerotic heart disease of native coronary artery without angina pectoris: Secondary | ICD-10-CM | POA: Insufficient documentation

## 2011-10-15 HISTORY — PX: OTHER SURGICAL HISTORY: SHX169

## 2011-10-15 LAB — POCT I-STAT 3, ART BLOOD GAS (G3+)
Acid-Base Excess: 1 mmol/L (ref 0.0–2.0)
O2 Saturation: 97 %
pO2, Arterial: 84 mmHg (ref 80.0–100.0)

## 2011-10-15 LAB — POCT I-STAT 3, VENOUS BLOOD GAS (G3P V)
Bicarbonate: 25.9 mEq/L — ABNORMAL HIGH (ref 20.0–24.0)
O2 Saturation: 56 %
pCO2, Ven: 45 mmHg (ref 45.0–50.0)
pO2, Ven: 31 mmHg (ref 30.0–45.0)

## 2011-10-15 LAB — POCT I-STAT GLUCOSE: Glucose, Bld: 422 mg/dL — ABNORMAL HIGH (ref 70–99)

## 2011-10-15 SURGERY — JV LEFT AND RIGHT HEART CATHETERIZATION WITH CORONARY ANGIOGRAM
Anesthesia: Moderate Sedation

## 2011-10-15 MED ORDER — SODIUM CHLORIDE 0.9 % IJ SOLN: 3.0000 mL | Freq: Two times a day (BID) | INTRAMUSCULAR | Status: DC

## 2011-10-15 MED ORDER — ROSUVASTATIN CALCIUM 40 MG PO TABS: 40.0000 mg | ORAL_TABLET | Freq: Every day | ORAL | Status: DC

## 2011-10-15 MED ORDER — FLUTICASONE-SALMETEROL 250-50 MCG/DOSE IN AEPB: 1.0000 | INHALATION_SPRAY | Freq: Two times a day (BID) | RESPIRATORY_TRACT | Status: DC

## 2011-10-15 MED ORDER — SODIUM CHLORIDE 0.9 % IV SOLN: 250.0000 mL | INTRAVENOUS | Status: DC | PRN

## 2011-10-15 MED ORDER — ONDANSETRON HCL 4 MG/2ML IJ SOLN: 4.0000 mg | Freq: Four times a day (QID) | INTRAMUSCULAR | Status: DC | PRN

## 2011-10-15 MED ORDER — LISINOPRIL 10 MG PO TABS: 10.0000 mg | ORAL_TABLET | Freq: Every day | ORAL | Status: DC

## 2011-10-15 MED ORDER — SODIUM CHLORIDE 0.9 % IJ SOLN: 3.0000 mL | INTRAMUSCULAR | Status: DC | PRN

## 2011-10-15 MED ORDER — PANTOPRAZOLE SODIUM 40 MG PO TBEC: 40.0000 mg | DELAYED_RELEASE_TABLET | Freq: Every day | ORAL | Status: DC

## 2011-10-15 MED ORDER — OXYCODONE-ACETAMINOPHEN 5-325 MG PO TABS: 1.0000 | ORAL_TABLET | ORAL | Status: DC | PRN

## 2011-10-15 MED ORDER — POTASSIUM CHLORIDE CRYS ER 20 MEQ PO TBCR: 20.0000 meq | EXTENDED_RELEASE_TABLET | Freq: Every day | ORAL | Status: DC

## 2011-10-15 MED ORDER — NITROGLYCERIN 0.4 MG SL SUBL: 0.4000 mg | SUBLINGUAL_TABLET | SUBLINGUAL | Status: DC | PRN

## 2011-10-15 MED ORDER — SODIUM CHLORIDE 0.9 % IV SOLN: 1.0000 mL/kg/h | INTRAVENOUS | Status: DC

## 2011-10-15 MED ORDER — ALBUTEROL SULFATE HFA 108 (90 BASE) MCG/ACT IN AERS: 3.0000 | INHALATION_SPRAY | Freq: Three times a day (TID) | RESPIRATORY_TRACT | Status: DC

## 2011-10-15 MED ORDER — CLOPIDOGREL BISULFATE 75 MG PO TABS
150.0000 mg | ORAL_TABLET | Freq: Once | ORAL | Status: AC
  Administered 2011-10-15: 150 mg via ORAL

## 2011-10-15 MED ORDER — ACETAMINOPHEN 325 MG PO TABS: 650.0000 mg | ORAL_TABLET | ORAL | Status: DC | PRN

## 2011-10-15 MED ORDER — METOPROLOL SUCCINATE ER 25 MG PO TB24: 25.0000 mg | ORAL_TABLET | Freq: Every day | ORAL | Status: DC

## 2011-10-15 MED ORDER — LEVOTHYROXINE SODIUM 112 MCG PO TABS: 112.0000 ug | ORAL_TABLET | Freq: Every day | ORAL | Status: DC

## 2011-10-15 MED ORDER — PREDNISONE 20 MG PO TABS: 40.0000 mg | ORAL_TABLET | Freq: Every day | ORAL | Status: DC

## 2011-10-15 MED ORDER — LATANOPROST 0.005 % OP SOLN: 1.0000 [drp] | Freq: Every day | OPHTHALMIC | Status: DC

## 2011-10-15 MED ORDER — INSULIN REGULAR HUMAN 100 UNIT/ML IJ SOLN
9.0000 [IU] | Freq: Once | INTRAMUSCULAR | Status: AC
  Administered 2011-10-15: 9 [IU] via SUBCUTANEOUS
  Filled 2011-10-15 (×3): qty 0.09

## 2011-10-15 MED ORDER — DIPHENOXYLATE-ATROPINE 2.5-0.025 MG PO TABS: 1.0000 | ORAL_TABLET | Freq: Four times a day (QID) | ORAL | Status: DC | PRN

## 2011-10-15 MED ORDER — FUROSEMIDE 80 MG PO TABS: 80.0000 mg | ORAL_TABLET | Freq: Every day | ORAL | Status: DC

## 2011-10-15 MED ORDER — ASPIRIN 325 MG PO TABS: 325.0000 mg | ORAL_TABLET | Freq: Every day | ORAL | Status: DC

## 2011-10-15 MED ORDER — ALPRAZOLAM 0.5 MG PO TABS: 0.5000 mg | ORAL_TABLET | Freq: Three times a day (TID) | ORAL | Status: DC | PRN

## 2011-10-15 MED ORDER — INSULIN GLARGINE 100 UNIT/ML ~~LOC~~ SOLN: 50.0000 [IU] | Freq: Every day | SUBCUTANEOUS | Status: DC

## 2011-10-15 MED ORDER — ALBUTEROL SULFATE (2.5 MG/3ML) 0.083% IN NEBU: 2.5000 mg | INHALATION_SOLUTION | Freq: Four times a day (QID) | RESPIRATORY_TRACT | Status: DC

## 2011-10-15 NOTE — Op Note (Signed)
Cardiac Catheterization Procedure Note  Name: [name] MRN: [id number] DOB: 1951-03-11  Procedure: Right Heart Cath, Left Heart Cath, Selective Coronary Angiography, LV angiography, saphenous vein graft and LIMA graft angiography  Indication: 61 year old white female with history of morbid obesity, diabetes, and hyperlipidemia. She is status post coronary bypass surgery 10 years ago and presents now with symptoms of increased dyspnea. Pulmonary evaluation has been unremarkable.  Procedural Details: The right groin was prepped, draped, and anesthetized with 1% lidocaine. Using the modified Seldinger technique a 7 French sheath was placed in the right femoral vein. A Swan-Ganz catheter was used for the right heart catheterization. Standard protocol was followed for recording of right heart pressures and sampling of oxygen saturations. Fick cardiac output was calculated. A left radial approach was used for arteriography. Using the modified Seldinger technique a 5 French sheath was placed in the left radial artery. Patient was given 6000 units of IV heparin. 3 mg of intra-arterial verapamil were given. Standard Judkins catheters were used for selective coronary angiography , left ventriculography, saphenous vein graft angiography, and LIMA graft angiography. There were no immediate procedural complications. The patient was transferred to the post catheterization recovery area for further monitoring.  Procedural Findings: Hemodynamics RA 6/4with a mean of 3 mm mercury RV 22/4 mmHg PA 23/7 mean of 16 mmHg PCWP 8/6 mean 5 mmHg LV 112/10 AO 111/63 mean 84 mmHg.  Oxygen saturations: PA 56% AO 97%  Cardiac Output (Fick) 3.7 L/min Cardiac Index (Fick) 1.6 L/min/m2   Coronary angiography: Coronary dominance: right  Left mainstem: Very short and severely disease.  Left anterior descending (LAD): Occluded at the ostium.  Left circumflex (LCx): A single small vessel in the AV groove. Obtuse  marginal vessel is occluded.  Right coronary artery (RCA): This is a dominant vessel. It is severely disease throughout the vessel. There sequential 90% stenoses in the proximal and mid vessel and in the distal vessel. The PDA has sequential 90-95% stenoses.  The saphenous vein graft to the obtuse marginal vessel is widely patent with good runoff.  The saphenous vein graft to the diagonal has a 80-90% focal stenosis in the mid body of the vein graft.  The saphenous vein graft to the PDA has a 30% stenosis in the mid graft. The distal graft there is a focal 90% stenosis.  The LIMA graft to the LAD is widely patent. The distal LAD appears to be diffusely diseased.  Left ventriculography: Left ventricular systolic function is normal, LVEF is estimated at 55-65%, there is no significant mitral regurgitation   Final Conclusions:   1. Severe 3 vessel obstructive atherosclerotic coronary disease. 2. Patent LIMA graft to the LAD. 3. Patent saphenous vein graft to the first obtuse marginal vessel 4. Patent saphenous vein graft to the diagonal with an 80-90% stenosis in the mid vein graft. 5. Patent saphenous vein graft to the PDA with a 90% stenosis in the distal vein graft. 6. Normal left and are function. 7. Normal right heart pressures.  Recommendations: Recommend consideration of catheter-based intervention and stenting of the 2  diseased vein grafts via a right radial approach.   Theron Arista [facility] 10/15/2011, 11:35 AM

## 2011-10-15 NOTE — OR Nursing (Signed)
+  Allen's test bilat. Hands, Sa02 97% both thumbs

## 2011-10-15 NOTE — Interval H&P Note (Signed)
History and Physical Interval Note:  10/15/2011 9:40 AM  [name]  has presented today for surgery, with the diagnosis of chest pain  The various methods of treatment have been discussed with the patient and family. After consideration of risks, benefits and other options for treatment, the patient has consented to  Procedure(s): JV LEFT AND RIGHT HEART CATHETERIZATION WITH CORONARY ANGIOGRAM as a surgical intervention .  The patients' history has been reviewed, patient examined, no change in status, stable for surgery.  I have reviewed the patients' chart and labs.  Questions were answered to the patient's satisfaction.     Theron Arista [facility]

## 2011-10-15 NOTE — OR Nursing (Signed)
Dr Jordan at bedside to discuss results and treatment plan with pt and family 

## 2011-10-15 NOTE — H&P (View-Only) (Signed)
Patient of Dr. Rothbart seen with Ms. Lawrence today for followup of progressive shortness of breath and exertional fatigue. She has a history of CAD status post CABG in 2003, underwent low risk Myoview in August 2012. More recently has undergone pulmonary function testing and followup with Dr. Byrum. Based on his note and testing, clear pulmonary etiology for the patient's symptoms was not noted. There was recommendation to consider further cardiology evaluation, possibly with cardiac catheterization.  She was seen in the ER recently with similar symptoms, troponin level normal. Recent ECGs reviewed including one from today, showing nonspecific ST-T changes. Recent BNP 111. Chest CT angiogram was negative for pulmonary embolus, showed some left upper lobe scarring with no pleural effusion or infiltrates.  She is comfortable on examination today, no active chest pain or breathlessness at rest. Vital signs reviewed. Lungs clear with decreased breath sounds no wheezing, cardiac exam without rub or gallop.  She is here with her significant other, reports continued problems with shortness of breath and fatigue, some atypical chest pains. Situation was reviewed, and discussion had regarding arrangement of a left and right heart catheterization later this week for clear definition of her native and bypass graft anatomy, also assessment of hemodynamics. After discussion of the risks and benefits, she was in agreement to proceed. She also voiced comfort with plan for outpatient evaluation.  Of note, she did mention that she had had difficulty with previous catheterization from her groin site prior to CABG, citing issues with obtaining adequate pain control. One consideration would be to proceed from a right radial approach, and use an IJ approach for the venous access. Will leave this to the discretion of the catheterization physician, however.  Samuel G. McDowell, M.D., F.A.C.C.  

## 2011-10-15 NOTE — OR Nursing (Signed)
Tegaderm dressing applied, site level 0, bedrest begins at 1135 

## 2011-10-15 NOTE — OR Nursing (Signed)
Discharge instructions reviewed and signed, pt stated understanding, ambulated in hall without difficulty, site level 0, transported to husband's car via wheelchair 

## 2011-10-18 ENCOUNTER — Encounter (HOSPITAL_COMMUNITY): Payer: Self-pay | Admitting: General Practice

## 2011-10-18 ENCOUNTER — Ambulatory Visit (HOSPITAL_COMMUNITY)
Admission: RE | Admit: 2011-10-18 | Discharge: 2011-10-19 | Disposition: A | Discharge status: Home / Self Care | Payer: BC Managed Care – PPO | Source: Ambulatory Visit | Attending: Cardiology | Admitting: Cardiology

## 2011-10-18 ENCOUNTER — Encounter (HOSPITAL_COMMUNITY)
Admission: RE | Disposition: A | Discharge status: Home / Self Care | Payer: Self-pay | Source: Ambulatory Visit | Attending: Cardiology

## 2011-10-18 ENCOUNTER — Other Ambulatory Visit: Payer: Self-pay

## 2011-10-18 DIAGNOSIS — R0989 Other specified symptoms and signs involving the circulatory and respiratory systems: Secondary | ICD-10-CM | POA: Insufficient documentation

## 2011-10-18 DIAGNOSIS — R0609 Other forms of dyspnea: Secondary | ICD-10-CM | POA: Insufficient documentation

## 2011-10-18 DIAGNOSIS — K589 Irritable bowel syndrome without diarrhea: Secondary | ICD-10-CM | POA: Insufficient documentation

## 2011-10-18 DIAGNOSIS — I2510 Atherosclerotic heart disease of native coronary artery without angina pectoris: Secondary | ICD-10-CM | POA: Insufficient documentation

## 2011-10-18 DIAGNOSIS — I25810 Atherosclerosis of coronary artery bypass graft(s) without angina pectoris: Secondary | ICD-10-CM

## 2011-10-18 DIAGNOSIS — J4489 Other specified chronic obstructive pulmonary disease: Secondary | ICD-10-CM | POA: Insufficient documentation

## 2011-10-18 DIAGNOSIS — F3289 Other specified depressive episodes: Secondary | ICD-10-CM | POA: Insufficient documentation

## 2011-10-18 DIAGNOSIS — H409 Unspecified glaucoma: Secondary | ICD-10-CM | POA: Insufficient documentation

## 2011-10-18 DIAGNOSIS — E119 Type 2 diabetes mellitus without complications: Secondary | ICD-10-CM | POA: Insufficient documentation

## 2011-10-18 DIAGNOSIS — F411 Generalized anxiety disorder: Secondary | ICD-10-CM | POA: Insufficient documentation

## 2011-10-18 DIAGNOSIS — F329 Major depressive disorder, single episode, unspecified: Secondary | ICD-10-CM | POA: Insufficient documentation

## 2011-10-18 DIAGNOSIS — I10 Essential (primary) hypertension: Secondary | ICD-10-CM | POA: Insufficient documentation

## 2011-10-18 DIAGNOSIS — E039 Hypothyroidism, unspecified: Secondary | ICD-10-CM | POA: Insufficient documentation

## 2011-10-18 DIAGNOSIS — E7800 Pure hypercholesterolemia, unspecified: Secondary | ICD-10-CM | POA: Insufficient documentation

## 2011-10-18 DIAGNOSIS — J449 Chronic obstructive pulmonary disease, unspecified: Secondary | ICD-10-CM | POA: Insufficient documentation

## 2011-10-18 HISTORY — DX: Other chronic pain: G89.29

## 2011-10-18 HISTORY — DX: Low back pain: M54.5

## 2011-10-18 HISTORY — PX: CORONARY ANGIOPLASTY WITH STENT PLACEMENT: SHX49

## 2011-10-18 HISTORY — DX: Low back pain, unspecified: M54.50

## 2011-10-18 HISTORY — PX: PERCUTANEOUS CORONARY STENT INTERVENTION (PCI-S): SHX5485

## 2011-10-18 LAB — BASIC METABOLIC PANEL
BUN: 10 mg/dL (ref 6–23)
Chloride: 99 mEq/L (ref 96–112)
Creatinine, Ser: 0.59 mg/dL (ref 0.50–1.10)
GFR calc non Af Amer: >90 mL/min (ref >90)
Glucose, Bld: 249 mg/dL — ABNORMAL HIGH (ref 70–99)
Potassium: 4.4 mEq/L (ref 3.5–5.1)

## 2011-10-18 LAB — GLUCOSE, CAPILLARY: Glucose-Capillary: 182 mg/dL — ABNORMAL HIGH (ref 70–99)

## 2011-10-18 LAB — CBC
HCT: 40.9 % (ref 36.0–46.0)
Hemoglobin: 13.7 g/dL (ref 12.0–15.0)
MCH: 29.9 pg (ref 26.0–34.0)
MCHC: 33.5 g/dL (ref 30.0–36.0)
RDW: 13 % (ref 11.5–15.5)

## 2011-10-18 SURGERY — PERCUTANEOUS CORONARY STENT INTERVENTION (PCI-S)
Anesthesia: LOCAL

## 2011-10-18 MED ORDER — ALPRAZOLAM 0.5 MG PO TABS
0.5000 mg | ORAL_TABLET | Freq: Every evening | ORAL | Status: DC | PRN
  Filled 2011-10-18: qty 1

## 2011-10-18 MED ORDER — POTASSIUM CHLORIDE CRYS ER 20 MEQ PO TBCR
20.0000 meq | EXTENDED_RELEASE_TABLET | Freq: Every day | ORAL | Status: DC
  Filled 2011-10-18: qty 1

## 2011-10-18 MED ORDER — NITROGLYCERIN 0.2 MG/ML ON CALL CATH LAB
INTRAVENOUS | Status: AC
  Filled 2011-10-18: qty 1

## 2011-10-18 MED ORDER — FENTANYL CITRATE 0.05 MG/ML IJ SOLN
INTRAMUSCULAR | Status: AC
  Filled 2011-10-18: qty 2

## 2011-10-18 MED ORDER — ACETAMINOPHEN 325 MG PO TABS: 650.0000 mg | ORAL_TABLET | ORAL | Status: DC | PRN

## 2011-10-18 MED ORDER — LEVOTHYROXINE SODIUM 112 MCG PO TABS
112.0000 ug | ORAL_TABLET | Freq: Every day | ORAL | Status: DC
  Filled 2011-10-18: qty 1

## 2011-10-18 MED ORDER — HEPARIN (PORCINE) IN NACL 2-0.9 UNIT/ML-% IJ SOLN
INTRAMUSCULAR | Status: AC
  Filled 2011-10-18: qty 2000

## 2011-10-18 MED ORDER — LATANOPROST 0.005 % OP SOLN
1.0000 [drp] | Freq: Every day | OPHTHALMIC | Status: DC
  Administered 2011-10-18: 1 [drp] via OPHTHALMIC
  Filled 2011-10-18: qty 2.5

## 2011-10-18 MED ORDER — ASPIRIN 81 MG PO CHEW
81.0000 mg | CHEWABLE_TABLET | Freq: Every day | ORAL | Status: DC
  Filled 2011-10-18: qty 1

## 2011-10-18 MED ORDER — ROSUVASTATIN CALCIUM 40 MG PO TABS
40.0000 mg | ORAL_TABLET | Freq: Every day | ORAL | Status: DC
Start: 2011-10-19 — End: 2011-10-19
  Filled 2011-10-18: qty 1

## 2011-10-18 MED ORDER — BIVALIRUDIN 250 MG IV SOLR
INTRAVENOUS | Status: AC
  Filled 2011-10-18: qty 250

## 2011-10-18 MED ORDER — INSULIN GLARGINE 100 UNIT/ML ~~LOC~~ SOLN
50.0000 [IU] | Freq: Every day | SUBCUTANEOUS | Status: DC
  Administered 2011-10-18: 50 [IU] via SUBCUTANEOUS
  Filled 2011-10-18: qty 3

## 2011-10-18 MED ORDER — SODIUM CHLORIDE 0.9 % IV SOLN: 1.0000 mL/kg/h | INTRAVENOUS | Status: DC

## 2011-10-18 MED ORDER — DIAZEPAM 5 MG PO TABS
10.0000 mg | ORAL_TABLET | ORAL | Status: AC
  Administered 2011-10-18: 10 mg via ORAL
  Filled 2011-10-18: qty 2

## 2011-10-18 MED ORDER — LISINOPRIL 10 MG PO TABS
10.0000 mg | ORAL_TABLET | Freq: Every day | ORAL | Status: DC
  Filled 2011-10-18: qty 1

## 2011-10-18 MED ORDER — SODIUM CHLORIDE 0.9 % IV SOLN: 250.0000 mL | INTRAVENOUS | Status: DC | PRN

## 2011-10-18 MED ORDER — ASPIRIN 81 MG PO CHEW
324.0000 mg | CHEWABLE_TABLET | ORAL | Status: AC
  Administered 2011-10-18: 324 mg via ORAL
  Filled 2011-10-18: qty 4

## 2011-10-18 MED ORDER — NITROGLYCERIN 0.4 MG SL SUBL: 0.4000 mg | SUBLINGUAL_TABLET | SUBLINGUAL | Status: DC | PRN

## 2011-10-18 MED ORDER — METOPROLOL SUCCINATE ER 25 MG PO TB24
25.0000 mg | ORAL_TABLET | Freq: Every day | ORAL | Status: DC
  Filled 2011-10-18: qty 1

## 2011-10-18 MED ORDER — CLOPIDOGREL BISULFATE 75 MG PO TABS: 75.0000 mg | ORAL_TABLET | ORAL | Status: DC

## 2011-10-18 MED ORDER — MIDAZOLAM HCL 2 MG/2ML IJ SOLN
INTRAMUSCULAR | Status: AC
  Filled 2011-10-18: qty 2

## 2011-10-18 MED ORDER — FLUTICASONE-SALMETEROL 250-50 MCG/DOSE IN AEPB
1.0000 | INHALATION_SPRAY | Freq: Two times a day (BID) | RESPIRATORY_TRACT | Status: DC
  Administered 2011-10-19: 1 via RESPIRATORY_TRACT
  Filled 2011-10-18: qty 14

## 2011-10-18 MED ORDER — ALBUTEROL SULFATE HFA 108 (90 BASE) MCG/ACT IN AERS
3.0000 | INHALATION_SPRAY | Freq: Three times a day (TID) | RESPIRATORY_TRACT | Status: DC
  Administered 2011-10-18 – 2011-10-19 (×2): 3 via RESPIRATORY_TRACT
  Filled 2011-10-18: qty 6.7

## 2011-10-18 MED ORDER — SODIUM CHLORIDE 0.9 % IJ SOLN: 3.0000 mL | INTRAMUSCULAR | Status: DC | PRN

## 2011-10-18 MED ORDER — LIDOCAINE HCL (PF) 1 % IJ SOLN
INTRAMUSCULAR | Status: AC
  Filled 2011-10-18: qty 30

## 2011-10-18 MED ORDER — DIPHENOXYLATE-ATROPINE 2.5-0.025 MG PO TABS: 1.0000 | ORAL_TABLET | Freq: Four times a day (QID) | ORAL | Status: DC | PRN

## 2011-10-18 MED ORDER — ONDANSETRON HCL 4 MG/2ML IJ SOLN: 4.0000 mg | Freq: Four times a day (QID) | INTRAMUSCULAR | Status: DC | PRN

## 2011-10-18 MED ORDER — SODIUM CHLORIDE 0.9 % IJ SOLN: 3.0000 mL | Freq: Two times a day (BID) | INTRAMUSCULAR | Status: DC

## 2011-10-18 MED ORDER — PANTOPRAZOLE SODIUM 40 MG PO TBEC: 40.0000 mg | DELAYED_RELEASE_TABLET | Freq: Every day | ORAL | Status: DC

## 2011-10-18 MED ORDER — INSULIN ASPART 100 UNIT/ML ~~LOC~~ SOLN
4.0000 [IU] | Freq: Three times a day (TID) | SUBCUTANEOUS | Status: DC
  Administered 2011-10-19: 4 [IU] via SUBCUTANEOUS
  Filled 2011-10-18: qty 3

## 2011-10-18 MED ORDER — LIRAGLUTIDE 18 MG/3ML ~~LOC~~ SOLN: 1.2000 mg | Freq: Every day | SUBCUTANEOUS | Status: DC

## 2011-10-18 MED ORDER — CLOPIDOGREL BISULFATE 75 MG PO TABS: 75.0000 mg | ORAL_TABLET | Freq: Every day | ORAL | Status: DC

## 2011-10-18 MED ORDER — SODIUM CHLORIDE 0.9 % IV SOLN: INTRAVENOUS | Status: DC

## 2011-10-18 NOTE — H&P (View-Only) (Signed)
HPI: [name] is a 61 y/o morbidly obese patient of [name] we are following for ongoing assessment and treatment of CAD, 5 vessel CABG in 2003) hypercholesterolemia. She also has history of COPD, diabetes, and hypothyroidism. She comes today after being seen in the ER for severe dyspnea after initially seeing her PCP. She was ruled out for PE, MI and CHF. She has been seen by Dr. Delton Coombes, pulmonologist to discuss PFT;s which were completed on 10/04/11. He found no pulmonary reason for her symptoms. He advised her to see cardiology with recommendations for cardiac catheterization. She has had a stress myoview completed in August of 2012 which was negative for ischemia, with normal LV systolic fix. She states that her breathing is so bad that she cannot walk 25 ft without having to stop. She is unable to clean her house or complete ADL:s without having to stop and rest often. Denies chest pain, but has pressure in her chest. No LEE, or nausea. She has frequent diaphoresis. She wishes to proceed with cardiac catheterization.She requests radial access as she states it is difficult to access her femoral sites.   No Known Allergies  Current Outpatient Prescriptions  Medication Sig Dispense Refill  . albuterol (PROAIR HFA) 108 (90 BASE) MCG/ACT inhaler Inhale 3 puffs into the lungs 3 (three) times daily.        [name] albuterol (PROVENTIL) (2.5 MG/3ML) 0.083% nebulizer solution Take 2.5 mg by nebulization every 6 (six) hours as needed.        . ALPRAZolam (XANAX) 0.5 MG tablet Take 0.5 mg by mouth at bedtime as needed. Usually about every third night.      [name] aspirin 325 MG tablet Take 325 mg by mouth daily.        . CRESTOR 40 MG tablet Take 40 mg by mouth daily.       . diphenoxylate-atropine (LOMOTIL) 2.5-0.025 MG per tablet Take 1 tablet by mouth 4 (four) times daily as needed. For loose bowels      . Fluticasone-Salmeterol (ADVAIR) 250-50 MCG/DOSE AEPB Inhale 1 puff into the lungs every 12 (twelve)  hours.        . furosemide (LASIX) 40 MG tablet Take 80 mg by mouth daily.       [name] ibuprofen (ADVIL,MOTRIN) 200 MG tablet Take 600 mg by mouth every 8 (eight) hours as needed. For pain      . insulin glargine (LANTUS) 100 UNIT/ML injection Inject 50 Units into the skin at bedtime.       . insulin lispro (HUMALOG) 100 UNIT/ML injection Inject into the skin. Sliding scale      . latanoprost (XALATAN) 0.005 % ophthalmic solution Place 1 drop into both eyes daily.       [name] levothyroxine (SYNTHROID, LEVOTHROID) 112 MCG tablet Take 112 mcg by mouth daily.       . Liraglutide (VICTOZA [place]) Inject into the skin.      . Liraglutide (VICTOZA) 18 MG/3ML SOLN Inject 1.2 mg into the skin daily.      [name] lisinopril (PRINIVIL,ZESTRIL) 10 MG tablet Take 10 mg by mouth daily.        . metFORMIN (GLUCOPHAGE) 1000 MG tablet Take 1,000 mg by mouth 2 (two) times daily with a meal.        . metoprolol succinate (TOPROL-XL) 25 MG 24 hr tablet Take 25 mg by mouth daily.       . nitroGLYCERIN (NITROSTAT) 0.4 MG SL tablet Place 1 tablet (0.4 mg total)  under the tongue every 5 (five) minutes as needed for chest pain.  25 tablet  3  . ONE TOUCH ULTRA TEST test strip       . pantoprazole (PROTONIX) 40 MG tablet Take 40 mg by mouth daily as needed. For acid reflux      . potassium chloride SA (K-DUR,KLOR-CON) 20 MEQ tablet Take 20 mEq by mouth daily.       . predniSONE (DELTASONE) 20 MG tablet Take 2 tablets (40 mg total) by mouth daily.  10 tablet  0   No current facility-administered medications for this visit.   Facility-Administered Medications Ordered in Other Visits  Medication Dose Route Frequency Provider Last Rate Last Dose  . iohexol (OMNIPAQUE) 350 MG/ML injection 80 mL  80 mL Intravenous Once PRN Medication Radiologist, MD   80 mL at 10/11/11 1548    Past Medical History  Diagnosis Date  . Diabetes mellitus   . Hypothyroidism   . Glaucoma   . Coronary artery disease   . Hypercholesterolemia   .  Hypertension   . Depression   . Anxiety   . Morbid obesity   . Irritable bowel syndrome   . Chronic diarrhea     Negative workup for microscopic colitis and Celiac disease in 2009.  . Asthma     Past Surgical History  Procedure Date  . Partial hysterectomy 1990s  . Cholecystectomy 1990s  . Tubal ligation 1978  . Shoulder surgery   . Ileocolonoscopy October 2009    Normal terminal ileum, greater sigmoid colon diverticula, no polyps. Random biopsies negative for microscopic colitis.  . Esophagogastroduodenoscopy October 2009     mild hypertrophy of the gastric mucosa seen in the body with mild erythema seen in the antrum appeared biopsy negative for H. pylori. 6 mm x 1 cm gastric nodules in the mid body, biopsy showed chronic gastritis the duodenal biopsies negative for celiac disease. Also had negative celiac disease serologies. large mouth duodenal diverticulum. One additional duodenal diverticula in the second portion  . Coronary artery bypass graft 2003    5 vessel  . Cataract extraction   . Hydrogen breath test 06/01/2011    Procedure: HYDROGEN BREATH TEST;  Surgeon: Arlyce Harman, MD;  Location: [facility];  Service: Endoscopy;  Laterality: N/A;  7:30/ for Bacterial Overgrowth    Family History  Problem Relation Age of Onset  . Cancer Mother 73    Unknown primary, possibly pancreatic  . Diabetes Father     Possible heart attack  . Colon cancer Neg Hx   . Liver disease Neg Hx   . Heart disease Father     History   Social History  . Marital Status: Married    Spouse Name: N/A    Number of Children: 2  . Years of Education: N/A   Occupational History  . UNEMPLOYED   .     Social History Main Topics  . Smoking status: Never Smoker   . Smokeless tobacco: Not on file  . Alcohol Use: No  . Drug Use: No  . Sexually Active: Not on file   Other Topics Concern  . Not on file   Social History Narrative  . No narrative on file    VOZ:DGUYQI of systems  complete and found to be negative unless listed abovex  PHYSICAL EXAM BP 146/81  Pulse 90  Ht 5\' 4"  (1.626 m)  Wt 275 lb (124.739 kg)  BMI 47.20 kg/m2  General: Well developed, well nourished,  in no acute distress, morbidly obese. Head: Eyes PERRLA, No xanthomas.   Normal cephalic and atramatic  Lungs:  Inspiratory wheezes with bibasilar crackles. Heart: HRRR S1 S2, tachycardic .  Pulses are 2+ & equal.            No carotid bruit. No JVD.  No abdominal bruits. No femoral bruits. Abdomen: Bowel sounds are positive, abdomen soft, very obese and non-tender without masses or                  Hernia's noted. Msk:  Back normal, normal gait. Normal strength and tone for age. Extremities: No clubbing, cyanosis or edema.  DP +1 Neuro: Alert and oriented X 3. Psych:  Good affect, responds appropriately  EKG: NSR with ST-T wave abnormality anteriorly and flattening in the lateral leads.  ASSESSMENT AND PLAN

## 2011-10-18 NOTE — Op Note (Signed)
CARDIAC CATH NOTE  Name: [name] MRN: [id number] DOB: 1951-05-04  Procedure: PTCA and stenting of the saphenous vein graft to the PDA and the saphenous vein graft to the diagonal.  Indication: 61 year old white female status post coronary bypass surgery 10 years ago who presents with progressive symptoms of dyspnea. Diagnostic cardiac catheterization showed severe stenoses in the saphenous vein graft to the PDA and a saphenous vein graft to the diagonal.  Procedural Details: The right wrist was prepped, draped, and anesthetized with 1% lidocaine. Using the modified Seldinger technique, a 6 Fr sheath was introduced into the radial artery. 3 mg verapamil was administered through the radial sheath. Weight-based bivalirudin was given for anticoagulation. Once a therapeutic ACT was achieved, a 6 French left Amplatz 1 guide catheter was inserted. We initially addressed the lesion in the distal saphenous vein graft to the PDA. This is a 90% stenosis.  A pro-water coronary guidewire was used to cross the lesion.  The lesion was predilated with a 2.5 mm balloon.  The lesion was then stented with a 3.0 x 16 Promus element stent.  This was deployed at 12 atmospheres.   Following PCI, there was 0% residual stenosis and TIMI-3 flow.  We then addressed the lesion in the saphenous vein graft to the diagonal. This was an 80-90% focal stenosis in the proximal saphenous vein graft. We also used the same guide and wire to cross the lesion. It was predilated with a 2.5 mm balloon. The lesion was then stented with a 3.0 x 12 mm Promus element stent. This was deployed at 12 atmospheres. Following PCI there was 0% residual stenosis and TIMI grade 3 flow. Final angiography confirmed an excellent result. The patient tolerated the procedure well. There were no immediate procedural complications. A TR band was used for radial hemostasis. The patient was transferred to the post catheterization recovery area for further  monitoring.  Lesion Data: Vessel: Saphenous vein graft to the PDA Percent stenosis (pre): 90% TIMI-flow (pre):  3. Stent:  3.0 x 16 mm Promus element Percent stenosis (post): 0% TIMI-flow (post): 3  Second vessel: Saphenous vein graft to the diagonal. Percent stenosis (pre-) 80-90% TIMI flow: 3 Stent: 3.0 x 12 millimeter Promus element Percent stenosis (post): 0% TIMI flow: 3.  Conclusions:  1. Successful stenting of the distal saphenous vein graft to the PDA with a drug-eluting stent. 2. Successful stenting of the proximal saphenous vein graft to the diagonal with a drug-eluting stent.  Recommendations: Continue dual antiplatelet therapy for at least one year.  Dianara Smullen [place] 10/18/2011, 12:12 PM

## 2011-10-18 NOTE — Progress Notes (Signed)
[name] has arrived in short-stay this morning for planned PCI as recommended on cardiac cath note from 10/15/11.    Jacqulyn Bath, PA-C 10/18/2011 8:01 AM

## 2011-10-18 NOTE — Interval H&P Note (Signed)
History and Physical Interval Note:  10/18/2011 11:03 AM  [name]  has presented today for surgery, with the diagnosis of blockage  The various methods of treatment have been discussed with the patient and family. After consideration of risks, benefits and other options for treatment, the patient has consented to  Procedure(s): PERCUTANEOUS CORONARY STENT INTERVENTION (PCI-S) as a surgical intervention .  The patients' history has been reviewed, patient examined, no change in status, stable for surgery.  I have reviewed the patients' chart and labs.  Questions were answered to the patient's satisfaction.     Thedora Hinders, [facility]

## 2011-10-19 ENCOUNTER — Encounter (HOSPITAL_COMMUNITY): Payer: Self-pay | Admitting: Physician Assistant

## 2011-10-19 ENCOUNTER — Other Ambulatory Visit: Payer: Self-pay

## 2011-10-19 DIAGNOSIS — I2510 Atherosclerotic heart disease of native coronary artery without angina pectoris: Secondary | ICD-10-CM

## 2011-10-19 LAB — CBC
HCT: 37.9 % (ref 36.0–46.0)
MCHC: 33.8 g/dL (ref 30.0–36.0)
MCV: 89.4 fL (ref 78.0–100.0)
Platelets: 223 10*3/uL (ref 150–400)
RDW: 13.2 % (ref 11.5–15.5)
WBC: 8.8 10*3/uL (ref 4.0–10.5)

## 2011-10-19 LAB — BASIC METABOLIC PANEL
BUN: 13 mg/dL (ref 6–23)
Chloride: 98 mEq/L (ref 96–112)
Creatinine, Ser: 0.68 mg/dL (ref 0.50–1.10)
GFR calc Af Amer: >90 mL/min (ref >90)
GFR calc non Af Amer: >90 mL/min (ref >90)
Potassium: 4.5 mEq/L (ref 3.5–5.1)

## 2011-10-19 MED ORDER — IBUPROFEN 200 MG PO TABS: 200.0000 mg | ORAL_TABLET | Freq: Four times a day (QID) | ORAL | Status: DC | PRN

## 2011-10-19 MED ORDER — METOPROLOL SUCCINATE ER 25 MG PO TB24: 25.0000 mg | ORAL_TABLET | Freq: Two times a day (BID) | ORAL | Status: DC

## 2011-10-19 MED ORDER — ASPIRIN 81 MG PO TABS: 81.0000 mg | ORAL_TABLET | Freq: Every day | ORAL | Status: AC

## 2011-10-19 NOTE — Progress Notes (Signed)
CARDIAC REHAB PHASE I   PRE:  Rate/Rhythm: 70 SR    BP: sitting 125/72    SaO2:   MODE:  Ambulation: 80 ft   POST:  Rate/Rhythm: 131 ST    BP: sitting 161/60     SaO2:   Pt very exerted with walking. Could only do 80 ft due to DOE. HR increased to 131 ST. Sts she feels much better than PTA. Further questioning reveals she has been SOB for years with exertion. This seems due to morbid obesity and deconditioning. Had extensive discussion with pt re: weight loss, increasing ex tol slowly and controlling DM/heart disease. Pt interested in [facility] and will send referral to [facility]. Pt also interested in water aerobics in future. Gave short duration/increased frequency walking guidelines to begin with. Highly recommended lifestyle change. Pt agreeable.  [phone]  [name] CES, ACSM

## 2011-10-19 NOTE — Discharge Summary (Signed)
Discharge Summary   Patient ID: [name] MRN: [id number], DOB/AGE: [birth year] 60 y.o. Admit date: 10/18/2011 D/C date:     10/19/2011   Primary Discharge Diagnoses:  1. CAD  - s/p planned PTCA/DES to SVG->PDA and DES to SVG->diagonal 10/18/11 - EF 55-65% by diagnostic cath 10/15/11 - s/p CABG 2003  Secondary Discharge Diagnoses:  1. Hypothyroidism 2. Morbid obesity 3. Hypercholesterolemia 4. Diabetes mellitus 5. Depression/anxiety 6. Arthritis/chronic low back pain 7. GERD 8. Glaucoma  Hospital Course: 61 y/o F with hx of CAD, diabetes, hypothyroidism presented to Dr. Ival Bible office following an ER visit for severe dyspnea. She ruled out PE, MI and CHF. She has a reported hx of COPD however has been seen seen by Dr. Delton Coombes, pulmonologist to discuss PFT;s which were completed on 10/04/11. He found no pulmonary reason for her symptoms. She has had a stress myoview completed in August of 2012 which was negative for ischemia, with normal LV systolic function. However she continued to have severe dyspnea even with ADL's. EKG showed NSR with ST-T wave abnormality anteriorly and flattening in the lateral leads. Dr. Diona Browner recommended cardiac catheterization. She underwent diagnostic cath 10/15/11 showing the following findings:  Final Conclusions:  1. Severe 3 vessel obstructive atherosclerotic coronary disease.  2. Patent LIMA graft to the LAD.  3. Patent saphenous vein graft to the first obtuse marginal vessel  4. Patent saphenous vein graft to the diagonal with an 80-90% stenosis in the mid vein graft.  5. Patent saphenous vein graft to the PDA with a 90% stenosis in the distal vein graft.  6. Normal left and are function.  7. Normal right heart pressures.  She returned 10/18/11 for planned PCI and ultimately underwent successful PTCA and stenting of the saphenous vein graft to the PDA and the saphenous vein graft to the diagonal. The recommendation was continue dual antiplatelet therapy  at least one year. Today the patient is doing well. She ambulated with cardiac rehab without complication - she had some residual dyspnea but this was felt secondary to deconditioning. Her HR did increase with activity to the 130's (sinus) and per discussion with Dr. [place], we will increase BB to Toprol 25mg  bid. Dr. [place] has seen and examined her today and feels she is stable for discharge.   Discharge Vitals: Blood pressure 125/67, pulse 80, temperature 97.4 F (36.3 C), temperature source Oral, resp. rate 20, height 5\' 4"  (1.626 m), weight 283 lb 8.2 oz (128.6 kg), SpO2 98.00%.  Labs: Lab Results  Component Value Date   WBC 8.8 10/19/2011   HGB 12.8 10/19/2011   HCT 37.9 10/19/2011   MCV 89.4 10/19/2011   PLT 223 10/19/2011    Lab 10/19/11 0445  NA 133*  K 4.5  CL 98  CO2 26  BUN 13  CREATININE 0.68  CALCIUM 9.7  PROT --  BILITOT --  ALKPHOS --  ALT --  AST --  GLUCOSE 216*    Diagnostic Studies/Procedures   1. Chest 2 View 10/11/2011  *RADIOLOGY REPORT*  IMPRESSION: No acute abnormalities.  Original Report Authenticated By: Gwynn Burly, M.D.   2. CT Angio Chest W/cm &/or Wo Cm 10/11/2011  *RADIOLOGY REPORT*    IMPRESSION: Technically adequate study without pulmonary embolus.  No acute abnormality.  Postoperative changes of CABG.  Original Report Authenticated By: Andreas Newport, M.D.   3. Cardiac catheterization this admission, please see full report and above for summary.   Discharge Medications  Of note, she takes ibuprofen  chronically. She was instructed to f/u with PCP to discuss alternatives given risk of stomach bleeding while taking aspirin/Plavix. Medication List  As of 10/19/2011  8:55 AM   TAKE these medications         ALPRAZolam 0.5 MG tablet   Commonly known as: XANAX   Take 0.5 mg by mouth at bedtime as needed. For sleep/Usually about every third night.      aspirin 81 MG tablet   Take 1 tablet (81 mg total) by mouth daily.      clopidogrel 75 MG tablet    Commonly known as: PLAVIX   Take 75 mg by mouth daily.      CRESTOR 40 MG tablet   Generic drug: rosuvastatin   Take 40 mg by mouth daily.      diphenoxylate-atropine 2.5-0.025 MG per tablet   Commonly known as: LOMOTIL   Take 1 tablet by mouth 4 (four) times daily as needed. For loose bowels      Fluticasone-Salmeterol 250-50 MCG/DOSE Aepb   Commonly known as: ADVAIR   Inhale 1 puff into the lungs every 12 (twelve) hours.      ibuprofen 200 MG tablet   Commonly known as: ADVIL,MOTRIN   Take 1 tablet (200 mg total) by mouth every 6 (six) hours as needed. For pain      insulin glargine 100 UNIT/ML injection   Commonly known as: LANTUS   Inject 50 Units into the skin at bedtime.      insulin lispro 100 UNIT/ML injection   Commonly known as: HUMALOG   Inject 0-35 Units into the skin 3 (three) times daily before meals. Sliding scale      latanoprost 0.005 % ophthalmic solution   Commonly known as: XALATAN   Place 1 drop into both eyes daily.      levothyroxine 112 MCG tablet   Commonly known as: SYNTHROID, LEVOTHROID   Take 112 mcg by mouth daily.      lisinopril 10 MG tablet   Commonly known as: PRINIVIL,ZESTRIL   Take 10 mg by mouth daily.      metoprolol succinate 25 MG 24 hr tablet   Commonly known as: TOPROL-XL   Take 1 tablet (25 mg total) by mouth 2 (two) times daily.      nitroGLYCERIN 0.4 MG SL tablet   Commonly known as: NITROSTAT   Place 0.4 mg under the tongue every 5 (five) minutes as needed. For chest pain      ONE TOUCH ULTRA TEST test strip   Generic drug: glucose blood      pantoprazole 40 MG tablet   Commonly known as: PROTONIX   Take 40 mg by mouth daily as needed. For acid reflux      potassium chloride SA 20 MEQ tablet   Commonly known as: K-DUR,KLOR-CON   Take 20 mEq by mouth daily.      PROAIR HFA 108 (90 BASE) MCG/ACT inhaler   Generic drug: albuterol   Inhale 3 puffs into the lungs 3 (three) times daily.      VICTOZA 18 MG/3ML Soln    Generic drug: Liraglutide   Inject 1.2 mg into the skin daily.            Disposition   The patient will be discharged in stable condition to home. Discharge Orders    Future Appointments: Provider: Department: Dept Phone: Center:   10/28/2011 1:45 PM Gerrit Friends. Dietrich Pates, MD [facility] [phone] [id number]     Future Orders Please Complete  By Expires   Diet - low sodium heart healthy      Comments:   Diabetic Diet   Increase activity slowly      Comments:   No driving for 3 days. No lifting over 5 lbs for 1 week. No sexual activity for 1 week. Keep procedure site clean & dry. If you notice increased pain, swelling, bleeding or pus, call/return!  You may shower, but no soaking baths/hot tubs/pools for 1 week.       Follow-up Information    Follow up with [place] Bing, MD. (10/28/11 at 1:45pm)    Contact information:   [address] [place] [address] [phone]            Duration of Discharge Encounter: Greater than 30 minutes including physician and PA time.  Signed, Ronie Spies PA-C 10/19/2011, 8:55 AM  Patient seen and examined and history reviewed. Agree with above findings and plan. See rounding note from earlier.   Thedora Hinders 10/19/2011 12:37 PM

## 2011-10-19 NOTE — Progress Notes (Signed)
TELEMETRY: Reviewed telemetry pt in NSR: Filed Vitals:   10/18/11 2023 10/18/11 2030 10/19/11 0045 10/19/11 0447  BP: 120/62  105/36 110/62  Pulse: 85  86 83  Temp: 98 F (36.7 C)  97.8 F (36.6 C) 97.9 F (36.6 C)  TempSrc: Oral  Oral Oral  Resp: 16  18 20   Height:      Weight:      SpO2: 98% 94% 95% 98%    Intake/Output Summary (Last 24 hours) at 10/19/11 0714 Last data filed at 10/18/11 1944  Gross per 24 hour  Intake    720 ml  Output      0 ml  Net    720 ml    SUBJECTIVE Feels great. No chest pain. Still some productive cough.  LABS: Basic Metabolic Panel:  Basename 10/19/11 0445 10/18/11 0723  NA 133* 134*  K 4.5 4.4  CL 98 99  CO2 26 25  GLUCOSE 216* 249*  BUN 13 10  CREATININE 0.68 0.59  CALCIUM 9.7 9.4  MG -- --  PHOS -- --   Liver Function Tests: No results found for this basename: AST:2,ALT:2,ALKPHOS:2,BILITOT:2,PROT:2,ALBUMIN:2 in the last 72 hours No results found for this basename: LIPASE:2,AMYLASE:2 in the last 72 hours CBC:  Basename 10/19/11 0445 10/18/11 0723  WBC 8.8 6.6  NEUTROABS -- --  HGB 12.8 13.7  HCT 37.9 40.9  MCV 89.4 89.3  PLT 223 244   Cardiac Enzymes: No results found for this basename: CKTOTAL:3,CKMB:3,CKMBINDEX:3,TROPONINI:3 in the last 72 hours BNP: No components found with this basename: POCBNP:3 D-Dimer: No results found for this basename: DDIMER:2 in the last 72 hours Hemoglobin A1C: No results found for this basename: HGBA1C in the last 72 hours Fasting Lipid Panel: No results found for this basename: CHOL,HDL,LDLCALC,TRIG,CHOLHDL,LDLDIRECT in the last 72 hours Thyroid Function Tests: No results found for this basename: TSH,T4TOTAL,FREET3,T3FREE,THYROIDAB in the last 72 hours Anemia Panel: No results found for this basename: VITAMINB12,FOLATE,FERRITIN,TIBC,IRON,RETICCTPCT in the last 72 hours  Radiology/Studies:  Dg Chest 2 View  10/11/2011  *RADIOLOGY REPORT*  Clinical Data: Wheezing.  Shortness of  breath.  CHEST - 2 VIEW  Comparison: 08/19/2011  Findings: Evidence of prior CABG.  Heart size and vascularity are normal.  Prominent right pericardial fat pad as demonstrated on prior CT dated 08/14/2008.  Minimal scarring in the left midzone. No acute osseous abnormality.  Reversal of the lower thoracic kyphosis is chronic.  IMPRESSION: No acute abnormalities.  Original Report Authenticated By: Gwynn Burly, M.D.   Ct Angio Chest W/cm &/or Wo Cm  10/11/2011  *RADIOLOGY REPORT*  Clinical Data: Pulmonary embolus.  Wheezing and short of breath.  CT ANGIOGRAPHY CHEST  Technique:  Multidetector CT imaging of the chest using the standard protocol during bolus administration of intravenous contrast. Multiplanar reconstructed images including MIPs were obtained and reviewed to evaluate the vascular anatomy.  Contrast: 80mL OMNIPAQUE IOHEXOL 350 MG/ML IV SOLN  Comparison: 10/11/2011.  Findings: Mediastinal lipomatosis.  Postop changes of CABG. Technically adequate study.  No pulmonary embolus.  Scattered areas of ground-glass attenuation are present in the lungs.  Left upper lobe scarring.  No pericardial or pleural effusion is present. Incidental imaging the upper abdomen is normal.  Cholecystectomy. Thoracic spine DISH.  No aggressive osseous lesions.  IMPRESSION: Technically adequate study without pulmonary embolus.  No acute abnormality.  Postoperative changes of CABG.  Original Report Authenticated By: Andreas Newport, M.D.    PHYSICAL EXAM General: Obese, well nourished, in no acute distress. Head: Normocephalic,  atraumatic, sclera non-icteric, no xanthomas, nares are without discharge. Neck: Negative for carotid bruits. JVD not elevated. Lungs: Clear bilaterally to auscultation without wheezes, rales, or rhonchi. Breathing is unlabored. Heart: RRR S1 S2 without murmurs, rubs, or gallops.  Abdomen: Soft, non-tender, non-distended with normoactive bowel sounds. No hepatomegaly. No rebound/guarding. No  obvious abdominal masses. Msk:  Strength and tone appears normal for age. Extremities: No clubbing, cyanosis or edema.  Distal pedal pulses are 2+ and equal bilaterally. No radial hematoma. Neuro: Alert and oriented X 3. Moves all extremities spontaneously. Psych:  Responds to questions appropriately with a normal affect.  ASSESSMENT AND PLAN: 1. USAP presenting as dyspnea. Now s/p stenting of SVGs to PDA and diagonal. Continue dual antiplatelet Rx for at least one year. 2. DM 3. Hyperlipidemia. 4. HTN 5. Morbid obesity. 6.copd see recent pulmonary notes  OK for discharge today. Follow up with Joni Reining in [place].    Signed, Omunique Pederson [place] MD, [facility] 10/19/11

## 2011-10-19 NOTE — Progress Notes (Signed)
PHARMACIST - PHYSICIAN COMMUNICATION DR:   [place], et.al.  CONCERNING:  61yo female s/p PTCA on 2/4   RECOMMENDATION: Please order VTE prophylaxis when appropriate

## 2011-10-21 MED FILL — Dextrose Inj 5%: INTRAVENOUS | Qty: 50 | Status: AC

## 2011-10-28 ENCOUNTER — Ambulatory Visit (INDEPENDENT_AMBULATORY_CARE_PROVIDER_SITE_OTHER): Payer: BC Managed Care – PPO | Admitting: Cardiology

## 2011-10-28 ENCOUNTER — Encounter: Payer: Self-pay | Admitting: Cardiology

## 2011-10-28 DIAGNOSIS — K219 Gastro-esophageal reflux disease without esophagitis: Secondary | ICD-10-CM | POA: Insufficient documentation

## 2011-10-28 DIAGNOSIS — I2510 Atherosclerotic heart disease of native coronary artery without angina pectoris: Secondary | ICD-10-CM

## 2011-10-28 DIAGNOSIS — E785 Hyperlipidemia, unspecified: Secondary | ICD-10-CM | POA: Insufficient documentation

## 2011-10-28 DIAGNOSIS — I10 Essential (primary) hypertension: Secondary | ICD-10-CM

## 2011-10-28 DIAGNOSIS — E039 Hypothyroidism, unspecified: Secondary | ICD-10-CM

## 2011-10-28 DIAGNOSIS — E1165 Type 2 diabetes mellitus with hyperglycemia: Secondary | ICD-10-CM | POA: Insufficient documentation

## 2011-10-28 DIAGNOSIS — D649 Anemia, unspecified: Secondary | ICD-10-CM

## 2011-10-28 DIAGNOSIS — K589 Irritable bowel syndrome without diarrhea: Secondary | ICD-10-CM | POA: Insufficient documentation

## 2011-10-28 MED ORDER — GUAIFENESIN-CODEINE 100-10 MG/5ML PO SYRP
5.0000 mL | ORAL_SOLUTION | ORAL | Status: AC | PRN
Start: 2011-10-28 — End: 2011-11-07

## 2011-10-28 MED ORDER — BENZONATATE 200 MG PO CAPS: 200.0000 mg | ORAL_CAPSULE | Freq: Three times a day (TID) | ORAL | Status: DC | PRN

## 2011-10-28 NOTE — Assessment & Plan Note (Signed)
Anemia has resolved.  

## 2011-10-28 NOTE — Assessment & Plan Note (Signed)
Hypothyroidism adequately treated when last assessed in 11/2010.

## 2011-10-28 NOTE — Progress Notes (Signed)
Patient ID: [name], female   DOB: Feb 01, 1951, 61 y.o.   MRN: [id number] HPI: Patient returns after a long hiatus (5 years) following elective cardiac catheterization, which revealed stenosis of 2 saphenous vein grafts prompting intervention with drug-eluting stents.  Procedure was performed to investigate increasing exertional dyspnea, which has not improved notably since the percutaneous intervention.  She has had a persistent cough productive of green sputum for the past 10 weeks following a upper respiratory infection which may have represented influenza.  The patient does not believe that she has had fever and has had no rigors since her initial illness.  She was treated with prescription antitussive medication for a period of time, but exhausted her supply.  She is motivated to exercise and lose weight and has joined cardiac rehabilitation.  She requests referral to a bariatric surgery program.  Recent evaluation by Dr. Delton Coombes revealed no significant chronic lung disease.  PFTs suggested small airways disease, and there was no response to bronchodilator.  Prior to Admission medications   Medication Sig Start Date End Date Taking? Authorizing Provider  albuterol (PROAIR HFA) 108 (90 BASE) MCG/ACT inhaler Inhale 3 puffs into the lungs 3 (three) times daily.     Yes Historical Provider, MD  ALPRAZolam Prudy Feeler) 0.5 MG tablet Take 0.5 mg by mouth at bedtime as needed. For sleep/Usually about every third night. 02/12/11  Yes Historical Provider, MD  aspirin 81 MG tablet Take 1 tablet (81 mg total) by mouth daily. 10/19/11  Yes Dayna N Dunn, PA  clopidogrel (PLAVIX) 75 MG tablet Take 75 mg by mouth daily.   Yes Historical Provider, MD  CRESTOR 40 MG tablet Take 40 mg by mouth daily.  03/17/11  Yes Historical Provider, MD  diphenoxylate-atropine (LOMOTIL) 2.5-0.025 MG per tablet Take 1 tablet by mouth 4 (four) times daily as needed. For loose bowels 02/15/11  Yes Historical Provider, MD    Fluticasone-Salmeterol (ADVAIR) 250-50 MCG/DOSE AEPB Inhale 1 puff into the lungs every 12 (twelve) hours.     Yes Historical Provider, MD  ibuprofen (ADVIL,MOTRIN) 200 MG tablet Take 1 tablet (200 mg total) by mouth every 6 (six) hours as needed. For pain 10/19/11  Yes Dayna N Dunn, PA  insulin glargine (LANTUS) 100 UNIT/ML injection Inject 50 Units into the skin at bedtime.    Yes Historical Provider, MD  insulin lispro (HUMALOG) 100 UNIT/ML injection Inject 0-35 Units into the skin 3 (three) times daily before meals. Sliding scale   Yes Historical Provider, MD  latanoprost (XALATAN) 0.005 % ophthalmic solution Place 1 drop into both eyes daily.  03/25/11  Yes Historical Provider, MD  levothyroxine (SYNTHROID, LEVOTHROID) 112 MCG tablet Take 112 mcg by mouth daily.  04/05/11  Yes Historical Provider, MD  lisinopril (PRINIVIL,ZESTRIL) 10 MG tablet Take 10 mg by mouth daily.     Yes Historical Provider, MD  metoprolol succinate (TOPROL-XL) 25 MG 24 hr tablet Take 1 tablet (25 mg total) by mouth 2 (two) times daily. 10/19/11  Yes Dayna N Dunn, PA  nitroGLYCERIN (NITROSTAT) 0.4 MG SL tablet Place 0.4 mg under the tongue every 5 (five) minutes as needed. For chest pain 05/07/11 05/06/12 Yes Joni Reining, NP  ONE TOUCH ULTRA TEST test strip  03/15/11  Yes Historical Provider, MD  pantoprazole (PROTONIX) 40 MG tablet Take 40 mg by mouth daily as needed. For acid reflux 01/25/11  Yes Historical Provider, MD  potassium chloride SA (K-DUR,KLOR-CON) 20 MEQ tablet Take 20 mEq by mouth daily.  Yes Historical Provider, MD  Liraglutide (VICTOZA) 18 MG/3ML SOLN Inject 1.2 mg into the skin daily.    Historical Provider, MD  No Known Allergies    Past medical history, social history, and family history reviewed and updated.  ROS: Denies orthopnea, PND, chest pain, palpitations, lightheadedness and syncope.   PHYSICAL EXAM: BP 127/83  Pulse 80  Ht 5\' 4"  (1.626 m)  Wt 125.646 kg (277 lb)  BMI 47.55 kg/m2   General-Well developed; no acute distress Body habitus-obese Neck-No JVD; no carotid bruits Lungs-clear lung fields; resonant to percussion Cardiovascular-normal PMI; normal S1 and S2; modest systolic ejection murmur present Abdomen-normal bowel sounds; soft and non-tender without masses or organomegaly Musculoskeletal-No deformities, no cyanosis or clubbing Neurologic-Normal cranial nerves; symmetric strength and tone Skin-Warm, no significant lesions Extremities-distal pulses intact; trace edema; healing punctures over the right and left radial arteries with mild ecchymosis on the right and normal pulse volume bilaterally  ASSESSMENT AND PLAN:  [place] Bing, MD 10/28/2011 2:51 PM

## 2011-10-28 NOTE — Assessment & Plan Note (Signed)
Blood pressure control is good; current medication will be continued. 

## 2011-10-28 NOTE — Assessment & Plan Note (Signed)
No recent lipid profile is available for review, but patient is being treated with a maximal dose of the most of the statin, which should provide adequate therapy.  A profile on treatment will be obtained.

## 2011-10-28 NOTE — Patient Instructions (Addendum)
**Note De-Identified Delynn Pursley Obfuscation** Your physician has recommended you make the following change in your medication: stop taking Lisinopril, start taking Tessalon perls 200 mg three times daily and Robitussin with Codeine 5 cc every three hours as needed. DO NOT STOP TAKING PLAVIX UNLESS INSTRUCTED BY CARDIOLOGIST.   Your physician has requested that you regularly monitor and record your blood pressure readings at home. Please use the same machine at the same time of day to check your readings and record them to bring to your follow-up visit.  Your physician recommends that you schedule a follow-up appointment in: 5 months  Your physician is referring you to Bariatric surg. Program, we will arrange

## 2011-10-28 NOTE — Assessment & Plan Note (Signed)
Patient is interested in bariatric surgery and will be referred to that program.  She will not be ready for surgery for at least 6 months as a result of recent cardiac intervention and the need to continue dual antiplatelet therapy.

## 2011-10-28 NOTE — Assessment & Plan Note (Signed)
It is unclear whether significant stenoses in 2 vein grafts were contributing to patient's symptoms.  She also is experiencing prolonged cough and sputum production, presumed related to a upper respiratory infection a few months ago.  We will attempt to optimize control of cardiovascular risk factors and treat symptomatically with antitussive agents.  Dr. Gerda Diss will reassess within the next few weeks and determine whether a return to her pulmonologist might be helpful.

## 2011-11-25 ENCOUNTER — Encounter (HOSPITAL_COMMUNITY): Payer: Self-pay

## 2011-11-25 ENCOUNTER — Encounter (HOSPITAL_COMMUNITY)
Admission: RE | Admit: 2011-11-25 | Discharge: 2011-11-25 | Disposition: A | Discharge status: Home / Self Care | Payer: BC Managed Care – PPO | Source: Ambulatory Visit | Attending: Family Medicine | Admitting: Family Medicine

## 2011-11-25 VITALS — BP 118/68 | HR 78 | Ht 64.0 in | Wt 277.9 lb

## 2011-11-25 DIAGNOSIS — I25810 Atherosclerosis of coronary artery bypass graft(s) without angina pectoris: Secondary | ICD-10-CM | POA: Insufficient documentation

## 2011-11-25 DIAGNOSIS — Z9861 Coronary angioplasty status: Secondary | ICD-10-CM | POA: Insufficient documentation

## 2011-11-25 DIAGNOSIS — Z955 Presence of coronary angioplasty implant and graft: Secondary | ICD-10-CM

## 2011-11-25 DIAGNOSIS — Z5189 Encounter for other specified aftercare: Secondary | ICD-10-CM | POA: Insufficient documentation

## 2011-11-25 NOTE — Progress Notes (Signed)
During orientation advised patient on arrival and appointment times what to wear, what to do before, during and after exercise. Reviewed attendance and class policy. Talked about inclement weather and class consultation policy. Pt is scheduled to start Cardiac Rehab on 11/29/11 at 8:15 am. Pt was advised to come to class 5 minutes before class starts. He was also given instructions on meeting with the dietician and attending the Family Structure classes. Pt is eager to get started.

## 2011-11-25 NOTE — Patient Instructions (Signed)
Pt has finished orientation and is scheduled to start CR on 11/29/11 at 8:15 am. Pt has been instructed to arrive to class 15 minutes early for scheduled class. Pt has been instructed to wear comfortable clothing and shoes with rubber soles. Pt has been told to take their medications 1 hour prior to coming to class.  If the patient is not going to attend class, he/she has been instructed to call.

## 2011-11-29 ENCOUNTER — Encounter (HOSPITAL_COMMUNITY)
Admission: RE | Admit: 2011-11-29 | Discharge: 2011-11-29 | Disposition: A | Discharge status: Home / Self Care | Payer: BC Managed Care – PPO | Source: Ambulatory Visit | Attending: Cardiology | Admitting: Cardiology

## 2011-12-01 ENCOUNTER — Encounter (HOSPITAL_COMMUNITY)
Admission: RE | Admit: 2011-12-01 | Discharge: 2011-12-01 | Disposition: A | Discharge status: Home / Self Care | Payer: BC Managed Care – PPO | Source: Ambulatory Visit | Attending: Family Medicine | Admitting: Family Medicine

## 2011-12-03 ENCOUNTER — Encounter (HOSPITAL_COMMUNITY): Payer: BC Managed Care – PPO

## 2011-12-06 ENCOUNTER — Encounter (HOSPITAL_COMMUNITY)
Admission: RE | Admit: 2011-12-06 | Discharge: 2011-12-06 | Disposition: A | Discharge status: Home / Self Care | Payer: BC Managed Care – PPO | Source: Ambulatory Visit | Attending: Family Medicine | Admitting: Family Medicine

## 2011-12-08 ENCOUNTER — Encounter (HOSPITAL_COMMUNITY): Payer: BC Managed Care – PPO

## 2011-12-10 ENCOUNTER — Encounter (HOSPITAL_COMMUNITY): Payer: BC Managed Care – PPO

## 2011-12-13 ENCOUNTER — Encounter (HOSPITAL_COMMUNITY)
Admission: RE | Admit: 2011-12-13 | Discharge: 2011-12-13 | Disposition: A | Discharge status: Home / Self Care | Payer: BC Managed Care – PPO | Source: Ambulatory Visit | Attending: Family Medicine | Admitting: Family Medicine

## 2011-12-13 DIAGNOSIS — Z9861 Coronary angioplasty status: Secondary | ICD-10-CM | POA: Insufficient documentation

## 2011-12-13 DIAGNOSIS — Z5189 Encounter for other specified aftercare: Secondary | ICD-10-CM | POA: Insufficient documentation

## 2011-12-13 DIAGNOSIS — I25810 Atherosclerosis of coronary artery bypass graft(s) without angina pectoris: Secondary | ICD-10-CM | POA: Insufficient documentation

## 2011-12-15 ENCOUNTER — Encounter (HOSPITAL_COMMUNITY)
Admission: RE | Admit: 2011-12-15 | Discharge: 2011-12-15 | Disposition: A | Discharge status: Home / Self Care | Payer: BC Managed Care – PPO | Source: Ambulatory Visit | Attending: Family Medicine | Admitting: Family Medicine

## 2011-12-17 ENCOUNTER — Encounter (HOSPITAL_COMMUNITY): Payer: BC Managed Care – PPO

## 2011-12-20 ENCOUNTER — Encounter (HOSPITAL_COMMUNITY)
Admission: RE | Admit: 2011-12-20 | Discharge: 2011-12-20 | Disposition: A | Discharge status: Home / Self Care | Payer: BC Managed Care – PPO | Source: Ambulatory Visit | Attending: Family Medicine | Admitting: Family Medicine

## 2011-12-22 ENCOUNTER — Encounter (HOSPITAL_COMMUNITY): Payer: BC Managed Care – PPO

## 2011-12-24 ENCOUNTER — Encounter (HOSPITAL_COMMUNITY): Payer: BC Managed Care – PPO

## 2011-12-27 ENCOUNTER — Encounter: Payer: Self-pay | Admitting: Adult Health

## 2011-12-27 ENCOUNTER — Ambulatory Visit (INDEPENDENT_AMBULATORY_CARE_PROVIDER_SITE_OTHER): Payer: BC Managed Care – PPO | Admitting: Adult Health

## 2011-12-27 ENCOUNTER — Encounter (HOSPITAL_COMMUNITY): Payer: BC Managed Care – PPO

## 2011-12-27 VITALS — BP 132/66 | HR 75 | Resp 16 | Ht 64.0 in | Wt 288.0 lb

## 2011-12-27 DIAGNOSIS — I2510 Atherosclerotic heart disease of native coronary artery without angina pectoris: Secondary | ICD-10-CM
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
## 2011-12-27 NOTE — Patient Instructions (Signed)
**Note De-identified [name] Obfuscation** Your physician recommends that you continue on your current medications as directed. Please refer to the Current Medication list given to you today.  Your physician recommends that you schedule a follow-up appointment in: 6 months  

## 2011-12-27 NOTE — Assessment & Plan Note (Signed)
I have reviewed her right and left cardiac cath results, her PFT results.  I have had her walk here in the office with O2 sats before and after walking. Her Sat's remained > 96% on room air. HR went up to 120 bpm and she was clearly short of breath.  I have asked [name] to see her along with me for further recommendations. After his review of the records it is his assessment that her symptoms are related to her morbid obesity. She is benefiting from cardiac rehab although she is short of breath with upright exercises she is able to work 15 minutes each on two separate bicycles. She is recommended to continue the cardiac rehab to the best of her ability as she is able to tolerate this low level of exercise. She is encouraged to pursue bariatric surgery process. We will see her in 6 months for ongoing follow-up.

## 2011-12-29 ENCOUNTER — Encounter (HOSPITAL_COMMUNITY): Payer: BC Managed Care – PPO

## 2011-12-31 ENCOUNTER — Encounter (HOSPITAL_COMMUNITY): Payer: BC Managed Care – PPO

## 2012-01-03 ENCOUNTER — Encounter (HOSPITAL_COMMUNITY): Payer: BC Managed Care – PPO

## 2012-01-05 ENCOUNTER — Encounter (HOSPITAL_COMMUNITY): Payer: BC Managed Care – PPO

## 2012-01-07 ENCOUNTER — Encounter (HOSPITAL_COMMUNITY): Payer: BC Managed Care – PPO

## 2012-01-10 ENCOUNTER — Telehealth: Payer: Self-pay | Admitting: Cardiology

## 2012-01-10 ENCOUNTER — Encounter (HOSPITAL_COMMUNITY): Payer: BC Managed Care – PPO

## 2012-01-10 NOTE — Telephone Encounter (Signed)
FYI:::::Received call from Diane with Cardiac Rehab.  Patient has not returned in 3wks for cardiac rehab.  Diane is contacting her today to advise patient that she will be unable to continue to hold her spot and if patient wishes to return later they will find spot for her.  Per Diane, patient still complains of SOB.

## 2012-01-12 ENCOUNTER — Encounter (HOSPITAL_COMMUNITY): Payer: BC Managed Care – PPO

## 2012-01-14 ENCOUNTER — Encounter (HOSPITAL_COMMUNITY): Payer: BC Managed Care – PPO

## 2012-01-17 ENCOUNTER — Encounter (HOSPITAL_COMMUNITY): Admission: RE | Admit: 2012-01-17 | Payer: BC Managed Care – PPO | Source: Ambulatory Visit

## 2012-01-19 ENCOUNTER — Encounter (HOSPITAL_COMMUNITY): Payer: BC Managed Care – PPO

## 2012-01-21 ENCOUNTER — Encounter (HOSPITAL_COMMUNITY): Payer: BC Managed Care – PPO

## 2012-01-24 ENCOUNTER — Encounter (HOSPITAL_COMMUNITY): Payer: BC Managed Care – PPO

## 2012-01-26 ENCOUNTER — Encounter (HOSPITAL_COMMUNITY): Payer: BC Managed Care – PPO

## 2012-01-26 NOTE — Progress Notes (Signed)
Cardiac Rehabilitation Program Outcomes Report   Orientation:  11/25/2011 Graduate Date:  tbd Discharge Date:  tbd # of sessions completed: 3 [id number] X2  Cardiologist: Ceres Bing Family MD:  Lubertha South Class Time:  08:15  A.  Exercise Program:  Tolerates exercise @ 1.9 METS for 15 minutes  B.  Mental Health:  Good mental attitude  C.  Education/Instruction/Skills  Knows THR for exercise and Uses Perceived Exertion Scale and/or Dyspnea Scale  Uses Perceived Exertion Scale and/or Dyspnea Scale  D.  Nutrition/Weight Control/Body Composition:  Adherence to prescribed nutrition program: good    E.  Blood Lipids    No results found for this basename: CHOL, HDL, LDLCALC, LDLDIRECT, TRIG, CHOLHDL    F.  Lifestyle Changes:  Making positive lifestyle changes  G.  Symptoms noted with exercise:  Asymptomatic  Report Completed By:  Angelica Pou RN   Comments:  This is patients 1st week report. She achieved a peak Mets of 1.9. Her resting Hr is 71 and her BP is 130/80. Her peak HR is 83 and her Peak BP is 148/78. A Halfway report will follow.

## 2012-01-26 NOTE — Progress Notes (Signed)
Cardiac Rehabilitation Program Outcomes Report   Orientation:  11/25/2011 Graduate Date:  12/20/2011 Discharge Date:  12/20/2011 # of sessions completed: 6  DX: Stent X2  Cardiologist: Bonner Bing Family MD:  Lubertha South Class Time:  08:15  A.  Exercise Program:  Tolerates exercise @ 2.1 METS for 15 minutes  B.  Mental Health:  Good mental attitude  C.  Education/Instruction/Skills  Knows THR for exercise and Uses Perceived Exertion Scale and/or Dyspnea Scale  Uses Perceived Exertion Scale and/or Dyspnea Scale  D.  Nutrition/Weight Control/Body Composition:  Adherence to prescribed nutrition program: good    E.  Blood Lipids    No results found for this basename: CHOL, HDL, LDLCALC, LDLDIRECT, TRIG, CHOLHDL    F.  Lifestyle Changes:  Making positive lifestyle changes  G.  Symptoms noted with exercise:  Asymptomatic  Report Completed By:  Lelon Huh. Albertha Beattie RN   Comments:  Patient Only completed 6 visits due to illness. She achieved a peak Mets of 2.1. Her resting HR was 90 and her resting BP 140/80. Her peak HR was 110 and peak BP was 160/90.

## 2012-01-28 ENCOUNTER — Encounter (HOSPITAL_COMMUNITY): Payer: BC Managed Care – PPO

## 2012-01-31 ENCOUNTER — Encounter (HOSPITAL_COMMUNITY): Payer: BC Managed Care – PPO

## 2012-02-02 ENCOUNTER — Encounter (HOSPITAL_COMMUNITY): Payer: BC Managed Care – PPO

## 2012-02-04 ENCOUNTER — Encounter (HOSPITAL_COMMUNITY): Payer: BC Managed Care – PPO

## 2012-02-07 ENCOUNTER — Encounter (HOSPITAL_COMMUNITY): Payer: BC Managed Care – PPO

## 2012-02-09 ENCOUNTER — Encounter (HOSPITAL_COMMUNITY): Payer: BC Managed Care – PPO

## 2012-02-11 ENCOUNTER — Encounter (HOSPITAL_COMMUNITY): Payer: BC Managed Care – PPO

## 2012-02-14 ENCOUNTER — Encounter (HOSPITAL_COMMUNITY): Payer: BC Managed Care – PPO

## 2012-02-16 ENCOUNTER — Encounter (HOSPITAL_COMMUNITY): Payer: BC Managed Care – PPO

## 2012-02-18 ENCOUNTER — Encounter (HOSPITAL_COMMUNITY): Payer: BC Managed Care – PPO

## 2012-02-21 ENCOUNTER — Encounter (HOSPITAL_COMMUNITY): Payer: BC Managed Care – PPO

## 2012-02-23 ENCOUNTER — Encounter (HOSPITAL_COMMUNITY): Payer: BC Managed Care – PPO

## 2012-02-25 ENCOUNTER — Encounter (HOSPITAL_COMMUNITY): Payer: BC Managed Care – PPO

## 2012-02-28 ENCOUNTER — Encounter (HOSPITAL_COMMUNITY): Payer: BC Managed Care – PPO

## 2012-03-01 ENCOUNTER — Encounter (HOSPITAL_COMMUNITY): Payer: BC Managed Care – PPO

## 2012-03-03 ENCOUNTER — Encounter (HOSPITAL_COMMUNITY): Payer: BC Managed Care – PPO

## 2012-05-08 ENCOUNTER — Ambulatory Visit (INDEPENDENT_AMBULATORY_CARE_PROVIDER_SITE_OTHER): Payer: BC Managed Care – PPO | Admitting: Cardiology

## 2012-05-08 ENCOUNTER — Encounter: Payer: Self-pay | Admitting: Cardiology

## 2012-05-08 VITALS — BP 122/60 | HR 84 | Ht 64.5 in | Wt 255.0 lb

## 2012-05-08 DIAGNOSIS — I2510 Atherosclerotic heart disease of native coronary artery without angina pectoris: Secondary | ICD-10-CM

## 2012-05-08 DIAGNOSIS — E119 Type 2 diabetes mellitus without complications: Secondary | ICD-10-CM

## 2012-05-08 DIAGNOSIS — I10 Essential (primary) hypertension: Secondary | ICD-10-CM

## 2012-05-08 DIAGNOSIS — D649 Anemia, unspecified: Secondary | ICD-10-CM

## 2012-05-08 DIAGNOSIS — E039 Hypothyroidism, unspecified: Secondary | ICD-10-CM

## 2012-05-08 DIAGNOSIS — I7090 Unspecified atherosclerosis: Secondary | ICD-10-CM

## 2012-05-08 DIAGNOSIS — E785 Hyperlipidemia, unspecified: Secondary | ICD-10-CM

## 2012-05-08 NOTE — Patient Instructions (Addendum)
Your physician recommends that you schedule a follow-up appointment in: 6 months  

## 2012-05-08 NOTE — Progress Notes (Deleted)
Name: [name]    DOB: [birth year]  Age: 61 y.o.  MR#: [id number]       PCP:  Harlow Asa, MD      Insurance: @PAYORNAME @   CC:    Chief Complaint  Patient presents with  . Follow-up    neg. list or meds    VS BP 122/60  Pulse 84  Ht 5' 4.5" (1.638 m)  Wt 255 lb (115.667 kg)  BMI 43.09 kg/m2  Weights Current Weight  05/08/12 255 lb (115.667 kg)  12/27/11 288 lb (130.636 kg)  11/25/11 277 lb 14.4 oz (126.055 kg)    Blood Pressure  BP Readings from Last 3 Encounters:  05/08/12 122/60  12/27/11 132/66  11/25/11 118/68     Admit date:  (Not on file) Last encounter with RMR:  01/10/2012   Allergy No Known Allergies  Current Outpatient Prescriptions  Medication Sig Dispense Refill  . albuterol (PROAIR HFA) 108 (90 BASE) MCG/ACT inhaler Inhale 3 puffs into the lungs 3 (three) times daily.        [name] ALPRAZolam (XANAX) 0.5 MG tablet Take 0.5 mg by mouth at bedtime as needed. For sleep/Usually about every third night.      [name] aspirin 81 MG tablet Take 1 tablet (81 mg total) by mouth daily.      . clopidogrel (PLAVIX) 75 MG tablet Take 75 mg by mouth daily.      . CRESTOR 40 MG tablet Take 40 mg by mouth daily.       . diphenoxylate-atropine (LOMOTIL) 2.5-0.025 MG per tablet Take 1 tablet by mouth 4 (four) times daily as needed. For loose bowels      . Fluticasone-Salmeterol (ADVAIR) 250-50 MCG/DOSE AEPB Inhale 1 puff into the lungs every 12 (twelve) hours.        [name] ibuprofen (ADVIL,MOTRIN) 200 MG tablet Take 1 tablet (200 mg total) by mouth every 6 (six) hours as needed. For pain      . insulin aspart (NOVOLOG) 100 UNIT/ML injection Inject into the skin as directed.       . insulin glargine (LANTUS) 100 UNIT/ML injection Inject 50 Units into the skin at bedtime.       [name] latanoprost (XALATAN) 0.005 % ophthalmic solution Place 1 drop into both eyes daily.       [name] levothyroxine (SYNTHROID, LEVOTHROID) 112 MCG tablet Take 112 mcg by mouth daily.       . Liraglutide (VICTOZA) 18  MG/3ML SOLN Inject 1.2 mg into the skin daily.      . metoprolol succinate (TOPROL-XL) 25 MG 24 hr tablet Take 1 tablet (25 mg total) by mouth 2 (two) times daily.  60 tablet  3  . pantoprazole (PROTONIX) 40 MG tablet Take 40 mg by mouth daily as needed. For acid reflux      . potassium chloride SA (K-DUR,KLOR-CON) 20 MEQ tablet Take 20 mEq by mouth daily.       . nitroGLYCERIN (NITROSTAT) 0.4 MG SL tablet Place 0.4 mg under the tongue every 5 (five) minutes as needed. For chest pain      . ONE TOUCH ULTRA TEST test strip         Discontinued Meds:   There are no discontinued medications.  Patient Active Problem List  Diagnosis  . Anemia  . Arteriosclerotic cardiovascular disease (ASCVD)  . Diabetes mellitus type II  . Hypothyroidism  . Hyperlipidemia  . Hypertension  . Morbid obesity  . Gastroesophageal reflux disease  . Irritable  bowel syndrome    LABS No visits with results within 3 Month(s) from this visit. Latest known visit with results is:  Admission on 10/18/2011, Discharged on 10/19/2011  Component Date Value  . Sodium 10/18/2011 134*  . Potassium 10/18/2011 4.4   . Chloride 10/18/2011 99   . CO2 10/18/2011 25   . Glucose, Bld 10/18/2011 249*  . BUN 10/18/2011 10   . Creatinine, Ser 10/18/2011 0.59   . Calcium 10/18/2011 9.4   . GFR calc non Af Amer 10/18/2011 >90   . GFR calc Af Amer 10/18/2011 >90   . WBC 10/18/2011 6.6   . RBC 10/18/2011 4.58   . Hemoglobin 10/18/2011 13.7   . HCT 10/18/2011 40.9   . MCV 10/18/2011 89.3   . [facility] 10/18/2011 29.9   . MCHC 10/18/2011 33.5   . RDW 10/18/2011 13.0   . Platelets 10/18/2011 244   . Glucose-Capillary 10/18/2011 227*  . WBC 10/19/2011 8.8   . RBC 10/19/2011 4.24   . Hemoglobin 10/19/2011 12.8   . HCT 10/19/2011 37.9   . MCV 10/19/2011 89.4   . [facility] 10/19/2011 30.2   . MCHC 10/19/2011 33.8   . RDW 10/19/2011 13.2   . Platelets 10/19/2011 223   . Sodium 10/19/2011 133*  . Potassium 10/19/2011 4.5   .  Chloride 10/19/2011 98   . CO2 10/19/2011 26   . Glucose, Bld 10/19/2011 216*  . BUN 10/19/2011 13   . Creatinine, Ser 10/19/2011 0.68   . Calcium 10/19/2011 9.7   . GFR calc non Af Amer 10/19/2011 >90   . GFR calc Af Amer 10/19/2011 >90   . Glucose-Capillary 10/18/2011 182*  . Activated Clotting Time 10/18/2011 358   . Glucose-Capillary 10/18/2011 248*  . Comment 1 10/18/2011 Notify RN   . Comment 2 10/18/2011 Documented in Chart   . Glucose-Capillary 10/19/2011 213*     Results for this Opt Visit:     Results for orders placed during the hospital encounter of 10/18/11  BASIC METABOLIC PANEL      Component Value Range   Sodium 134 (*) 135 - 145 mEq/L   Potassium 4.4  3.5 - 5.1 mEq/L   Chloride 99  96 - 112 mEq/L   CO2 25  19 - 32 mEq/L   Glucose, Bld 249 (*) 70 - 99 mg/dL   BUN 10  6 - 23 mg/dL   Creatinine, Ser 1.61  0.50 - 1.10 mg/dL   Calcium 9.4  8.4 - 09.6 mg/dL   GFR calc non Af Amer >90  >90 mL/min   GFR calc Af Amer >90  >90 mL/min  CBC      Component Value Range   WBC 6.6  4.0 - 10.5 K/uL   RBC 4.58  3.87 - 5.11 MIL/uL   Hemoglobin 13.7  12.0 - 15.0 g/dL   HCT 04.5  40.9 - 81.1 %   MCV 89.3  78.0 - 100.0 fL   MCH 29.9  26.0 - 34.0 pg   MCHC 33.5  30.0 - 36.0 g/dL   RDW 91.4  78.2 - 95.6 %   Platelets 244  150 - 400 K/uL  GLUCOSE, CAPILLARY      Component Value Range   Glucose-Capillary 227 (*) 70 - 99 mg/dL  CBC      Component Value Range   WBC 8.8  4.0 - 10.5 K/uL   RBC 4.24  3.87 - 5.11 MIL/uL   Hemoglobin 12.8  12.0 - 15.0 g/dL  HCT 37.9  36.0 - 46.0 %   MCV 89.4  78.0 - 100.0 fL   MCH 30.2  26.0 - 34.0 pg   MCHC 33.8  30.0 - 36.0 g/dL   RDW 16.1  09.6 - 04.5 %   Platelets 223  150 - 400 K/uL  BASIC METABOLIC PANEL      Component Value Range   Sodium 133 (*) 135 - 145 mEq/L   Potassium 4.5  3.5 - 5.1 mEq/L   Chloride 98  96 - 112 mEq/L   CO2 26  19 - 32 mEq/L   Glucose, Bld 216 (*) 70 - 99 mg/dL   BUN 13  6 - 23 mg/dL   Creatinine, Ser  4.09  0.50 - 1.10 mg/dL   Calcium 9.7  8.4 - 81.1 mg/dL   GFR calc non Af Amer >90  >90 mL/min   GFR calc Af Amer >90  >90 mL/min  GLUCOSE, CAPILLARY      Component Value Range   Glucose-Capillary 182 (*) 70 - 99 mg/dL  POCT ACTIVATED CLOTTING TIME      Component Value Range   Activated Clotting Time 358    GLUCOSE, CAPILLARY      Component Value Range   Glucose-Capillary 248 (*) 70 - 99 mg/dL   Comment 1 Notify RN     Comment 2 Documented in Chart    GLUCOSE, CAPILLARY      Component Value Range   Glucose-Capillary 213 (*) 70 - 99 mg/dL    EKG Orders placed during the hospital encounter of 10/18/11  . EKG 12-LEAD  . EKG 12-LEAD  . EKG     Prior Assessment and Plan Problem List as of 05/08/2012            Cardiology Problems   Arteriosclerotic cardiovascular disease (ASCVD)   Last Assessment & Plan Note   12/27/2011 Office Visit Signed 12/27/2011  4:22 PM by Jodelle Gross, NP    I have reviewed her right and left cardiac cath results, her PFT results.  I have had her walk here in the office with O2 sats before and after walking. Her Sat's remained > 96% on room air. HR went up to 120 bpm and she was clearly short of breath.  I have asked [name] to see her along with me for further recommendations. After his review of the records it is his assessment that her symptoms are related to her morbid obesity. She is benefiting from cardiac rehab although she is short of breath with upright exercises she is able to work 15 minutes each on two separate bicycles. She is recommended to continue the cardiac rehab to the best of her ability as she is able to tolerate this low level of exercise. She is encouraged to pursue bariatric surgery process. We will see her in 6 months for ongoing follow-up.    Hyperlipidemia   Last Assessment & Plan Note   10/28/2011 Office Visit Signed 10/28/2011  5:16 PM by Kathlen Brunswick, MD    No recent lipid profile is available for review, but patient  is being treated with a maximal dose of the most of the statin, which should provide adequate therapy.  A profile on treatment will be obtained.    Hypertension   Last Assessment & Plan Note   10/28/2011 Office Visit Signed 10/28/2011  5:16 PM by Kathlen Brunswick, MD    Blood pressure control is good; current medication will be continued.  Other   Anemia   Last Assessment & Plan Note   10/28/2011 Office Visit Signed 10/28/2011  5:13 PM by Kathlen Brunswick, MD    Anemia has resolved.    Diabetes mellitus type II   Hypothyroidism   Last Assessment & Plan Note   10/28/2011 Office Visit Signed 10/28/2011  5:17 PM by Kathlen Brunswick, MD    Hypothyroidism adequately treated when last assessed in 11/2010.    Morbid obesity   Last Assessment & Plan Note   10/28/2011 Office Visit Signed 10/28/2011  5:18 PM by Kathlen Brunswick, MD    Patient is interested in bariatric surgery and will be referred to that program.  She will not be ready for surgery for at least 6 months as a result of recent cardiac intervention and the need to continue dual antiplatelet therapy.    Gastroesophageal reflux disease   Irritable bowel syndrome       Imaging: No results found.   FRS Calculation: Score not calculated. Missing: Total Cholesterol, HDL

## 2012-05-10 NOTE — Assessment & Plan Note (Addendum)
Adequately controlled hyperlipidemia.  Low HDL and elevated triglycerides reflect suboptimal management of diabetes, which is currently being addressed by Dr. Fransico Him.

## 2012-05-10 NOTE — Assessment & Plan Note (Signed)
Most recent CBC in 09/2011 was entirely normal; anemia has resolved.

## 2012-05-10 NOTE — Assessment & Plan Note (Signed)
Blood pressure control has been excellent over the past 12 months whenever assessed in medical settings; current therapy will be continued.

## 2012-05-10 NOTE — Assessment & Plan Note (Signed)
The patient has done well since intervention in 2 saphenous vein grafts with DES.  Dual anti-platelet therapy will be continued for a total of 12 months.  Risk factor management will be optimized.

## 2012-05-10 NOTE — Progress Notes (Signed)
Patient ID: [name], female   DOB: Oct 23, 1950, 61 y.o.   MRN: [id number]  HPI: Scheduled return visit for this very nice 61 year old woman who underwent PCI 6 months ago.  Since then, she has done well with no cardiopulmonary symptoms.  Prior to Admission medications   Medication Sig Start Date End Date Taking? Authorizing Provider  albuterol (PROAIR HFA) 108 (90 BASE) MCG/ACT inhaler Inhale 3 puffs into the lungs 3 (three) times daily.     Yes Historical Provider, MD  ALPRAZolam Prudy Feeler) 0.5 MG tablet Take 0.5 mg by mouth at bedtime as needed. For sleep/Usually about every third night. 02/12/11  Yes Historical Provider, MD  aspirin 81 MG tablet Take 1 tablet (81 mg total) by mouth daily. 10/19/11  Yes Karla N Dunn, PA  Canagliflozin (INVOKANA) 100 MG TABS Take 100 mg by mouth daily.   Yes Historical Provider, MD  clopidogrel (PLAVIX) 75 MG tablet Take 75 mg by mouth daily.   Yes Historical Provider, MD  CRESTOR 40 MG tablet Take 40 mg by mouth daily.  03/17/11  Yes Historical Provider, MD  diphenoxylate-atropine (LOMOTIL) 2.5-0.025 MG per tablet Take 1 tablet by mouth 4 (four) times daily as needed. For loose bowels 02/15/11  Yes Historical Provider, MD  Fluticasone-Salmeterol (ADVAIR) 250-50 MCG/DOSE AEPB Inhale 1 puff into the lungs every 12 (twelve) hours.     Yes Historical Provider, MD  ibuprofen (ADVIL,MOTRIN) 200 MG tablet Take 1 tablet (200 mg total) by mouth every 6 (six) hours as needed. For pain 10/19/11  Yes Karla N Dunn, PA  insulin aspart (NOVOLOG) 100 UNIT/ML injection Inject into the skin as directed.    Yes Historical Provider, MD  insulin glargine (LANTUS) 100 UNIT/ML injection Inject 50 Units into the skin at bedtime.    Yes Historical Provider, MD  latanoprost (XALATAN) 0.005 % ophthalmic solution Place 1 drop into both eyes daily.  03/25/11  Yes Historical Provider, MD  levothyroxine (SYNTHROID, LEVOTHROID) 112 MCG tablet Take 112 mcg by mouth daily.  04/05/11  Yes Historical  Provider, MD  Liraglutide (VICTOZA) 18 MG/3ML SOLN Inject 1.2 mg into the skin daily.   Yes Historical Provider, MD  metoprolol succinate (TOPROL-XL) 25 MG 24 hr tablet Take 1 tablet (25 mg total) by mouth 2 (two) times daily. 10/19/11  Yes Karla N Dunn, PA  pantoprazole (PROTONIX) 40 MG tablet Take 40 mg by mouth daily as needed. For acid reflux 01/25/11  Yes Historical Provider, MD  potassium chloride SA (K-DUR,KLOR-CON) 20 MEQ tablet Take 20 mEq by mouth daily.    Yes Historical Provider, MD  nitroGLYCERIN (NITROSTAT) 0.4 MG SL tablet Place 0.4 mg under the tongue every 5 (five) minutes as needed. For chest pain 05/07/11 05/06/12  Karla Gross, NP  ONE TOUCH ULTRA TEST test strip  03/15/11   Historical Provider, MD   Past medical history, social history, and family history reviewed and updated.  ROS: Denies chest pain, dyspnea, orthopnea, PND, palpitations, lightheadedness or syncope.  All other systems reviewed and are negative.  PHYSICAL EXAM: BP 122/60  Pulse 84  Ht 5' 4.5" (1.638 m)  Wt 115.667 kg (255 lb)  BMI 43.09 kg/m2  General-Well developed; no acute distress Body habitus-markedly overweight Neck-No JVD; no carotid bruits Lungs-clear lung fields; resonant to percussion; decreased breath sounds throughout  Cardiovascular-normal PMI; normal S1 and S2 Abdomen-normal bowel sounds; soft and non-tender without masses or organomegaly Musculoskeletal-No deformities, no cyanosis or clubbing Neurologic-Normal cranial nerves; symmetric strength and tone Skin-Warm, no  significant lesions Extremities-distal pulses intact; 1/2+ ankle edema  ASSESSMENT AND PLAN:  Karla Bing, MD 05/10/2012 10:03 AM

## 2012-05-10 NOTE — Assessment & Plan Note (Addendum)
Recent A1c level of 14.8.  Dr. Fransico Him is working closely with patient to improve diabetic control.

## 2012-05-10 NOTE — Assessment & Plan Note (Signed)
Recent PFTs demonstrate normal thyroid function.

## 2012-10-31 ENCOUNTER — Other Ambulatory Visit: Payer: Self-pay

## 2012-11-01 MED ORDER — CLOPIDOGREL BISULFATE 75 MG PO TABS: 75.0000 mg | ORAL_TABLET | Freq: Every day | ORAL | Status: DC

## 2012-12-05 ENCOUNTER — Other Ambulatory Visit: Payer: Self-pay | Admitting: *Deleted

## 2012-12-05 MED ORDER — ROSUVASTATIN CALCIUM 40 MG PO TABS: 40.0000 mg | ORAL_TABLET | Freq: Every day | ORAL | Status: DC

## 2012-12-24 IMAGING — NM NM MYOCAR MULTI W/SPECT W/WALL MOTION & EF
1 series · 6 of 6 positions shown · non-contrast
Comparison: none

nm myoview pharmacologic stress

Ordering Physician: SAVIO LOCKLEAR
Ysa Physician: [REDACTED]al Data: 59-year-old woman with coronary artery disease,
exertional intolerance and fatigue.
NUCLEAR MEDICINE ADENOSINE STRESS MYOVIEW STUDY WITH SPECT AND LEFT
VENTRIUCLAR EJECTION FRACTION
Radionuclide Data: One-day rest/stress protocol performed with
[DATE] mCi of 8c-BBm Myoview.
Stress Data: Regadenoson infusion resulted in a typical and modest
increase in heart rate but a small increase in systolic blood
pressure as well.  No arrhythmias noted.
EKG: Normal sinus rhythm; low voltage in the chest leads; ST-T wave
abnormalities consistent with LVH or inferolateral ischemia.
Occasional PVCs.  No significant change with pharmacologic stress.
Scintigraphic Data: Acquisition notable for severe breast and soft
tissue attenuation resulting [REDACTED]reased image quality.  Left
ventricular size was normal.  On tomographic images reconstructed
in standard planes, there was a small  focal defect in the mid and
distal septum of mild to moderate intensity.  By comparison to the
resting portion of the study, slight reversibility was apparent in
the anterior and apical border of this defect.  The gated
reconstruction demonstrated normal regional and global LV systolic
function as well as normal systolic accentuation of activity
throughout.  Estimated ejection fraction was 62%.

[cs cardiac tc hi dose · 6.41mm/px · 6 of 509 frames shown]
[frame 43/509]
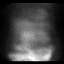
[frame 127/509]
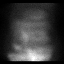
[frame 212/509]
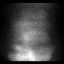
[frame 297/509]
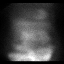
[frame 382/509]
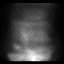
[frame 467/509]
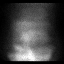

[6 of 6 positions shown; findings below may reference images not displayed]

IMPRESSION: Probably negative pharmacologic stress nuclear myocardial study
revealing normal left ventricular size and function and a small
perfusion defect most likely reflecting variable soft tissue
attenuation; however, the possibility of a small area of ischemia
must be considered.  Overall, this is a low risk study.

## 2012-12-25 ENCOUNTER — Other Ambulatory Visit: Payer: Self-pay | Admitting: Family Medicine

## 2013-01-13 ENCOUNTER — Encounter: Payer: Self-pay | Admitting: *Deleted

## 2013-02-13 ENCOUNTER — Other Ambulatory Visit: Payer: Self-pay | Admitting: Family Medicine

## 2013-02-28 ENCOUNTER — Telehealth: Payer: Self-pay | Admitting: Family Medicine

## 2013-02-28 NOTE — Telephone Encounter (Signed)
Rx written and faxed to pharmacy at fax number listed below. Patient was notified.

## 2013-02-28 NOTE — Telephone Encounter (Signed)
Pt states Dr Fransico Him is out of [place] till Jul 1, she dropped her Lantus bottle while in [place] an needs a refill. [facility] in [place] won't refill unless we send a script to them there. Can we refill this one time? Fax # [phone]

## 2013-02-28 NOTE — Telephone Encounter (Signed)
ok 

## 2013-03-08 ENCOUNTER — Other Ambulatory Visit: Payer: Self-pay | Admitting: Family Medicine

## 2013-03-21 ENCOUNTER — Other Ambulatory Visit: Payer: Self-pay | Admitting: Family Medicine

## 2013-03-26 ENCOUNTER — Other Ambulatory Visit: Payer: Self-pay | Admitting: Family Medicine

## 2013-03-29 ENCOUNTER — Encounter: Payer: Self-pay | Admitting: Adult Health

## 2013-03-29 ENCOUNTER — Ambulatory Visit (INDEPENDENT_AMBULATORY_CARE_PROVIDER_SITE_OTHER): Payer: BC Managed Care – PPO | Admitting: Adult Health

## 2013-03-29 VITALS — BP 113/67 | HR 89 | Ht 64.0 in | Wt 263.0 lb

## 2013-03-29 DIAGNOSIS — I10 Essential (primary) hypertension: Secondary | ICD-10-CM

## 2013-03-29 DIAGNOSIS — I2510 Atherosclerotic heart disease of native coronary artery without angina pectoris: Secondary | ICD-10-CM

## 2013-03-29 DIAGNOSIS — I7090 Unspecified atherosclerosis: Secondary | ICD-10-CM

## 2013-03-29 MED ORDER — NITROGLYCERIN 0.4 MG SL SUBL: 0.4000 mg | SUBLINGUAL_TABLET | SUBLINGUAL | Status: DC | PRN

## 2013-03-29 MED ORDER — CLOPIDOGREL BISULFATE 75 MG PO TABS: ORAL_TABLET | ORAL | Status: DC

## 2013-03-29 MED ORDER — ROSUVASTATIN CALCIUM 40 MG PO TABS: 40.0000 mg | ORAL_TABLET | Freq: Every day | ORAL | Status: DC

## 2013-03-29 NOTE — Assessment & Plan Note (Signed)
She is currently without cardiac complaint. She is medically compliant. Labs are checked by Dr. Gerda Diss.  She will continue on Plavix for another 6 months, although she could stop in August as it has been a year, she has multiple stents and therefore will wait until January to stop this medication.

## 2013-03-29 NOTE — Assessment & Plan Note (Signed)
We have discussed wt loss and her plans to increase her activity by swimming daily at the [facility] I have encouraged her to do this

## 2013-03-29 NOTE — Progress Notes (Deleted)
Name: [name]    DOB: [birth year]  Age: 62 y.o.  MR#: [id number]       PCP:  Harlow Asa, MD      Insurance: Payor: BLUE CROSS BLUE SHIELD / Plan: BCBS PPO OUT OF STATE / Product Type: *No Product type* /   CC:    Chief Complaint  Patient presents with  . Coronary Artery Disease    VS Filed Vitals:   03/29/13 1358  BP: 113/67  Pulse: 89  Height: 5\' 4"  (1.626 m)  Weight: 263 lb (119.296 kg)    Weights Current Weight  03/29/13 263 lb (119.296 kg)  05/08/12 255 lb (115.667 kg)  12/27/11 288 lb (130.636 kg)    Blood Pressure  BP Readings from Last 3 Encounters:  03/29/13 113/67  05/08/12 122/60  12/27/11 132/66     Admit date:  (Not on file) Last encounter with RMR:  Visit date not found   Allergy Glucophage and Naproxen  Current Outpatient Prescriptions  Medication Sig Dispense Refill  . albuterol (PROAIR HFA) 108 (90 BASE) MCG/ACT inhaler Inhale 3 puffs into the lungs 3 (three) times daily.        [name] ALPRAZolam (XANAX) 0.5 MG tablet Take 0.5 mg by mouth at bedtime as needed. For sleep/Usually about every third night.      [name] aspirin 81 MG tablet Take 1 tablet (81 mg total) by mouth daily.      . diphenoxylate-atropine (LOMOTIL) 2.5-0.025 MG per tablet Take 1 tablet by mouth 4 (four) times daily as needed. For loose bowels      . Fluticasone-Salmeterol (ADVAIR) 250-50 MCG/DOSE AEPB Inhale 1 puff into the lungs every 12 (twelve) hours.        [name] ibuprofen (ADVIL,MOTRIN) 200 MG tablet Take 1 tablet (200 mg total) by mouth every 6 (six) hours as needed. For pain      . insulin aspart (NOVOLOG) 100 UNIT/ML injection Inject into the skin as directed.       . insulin glargine (LANTUS) 100 UNIT/ML injection Inject 50 Units into the skin at bedtime.       [name] latanoprost (XALATAN) 0.005 % ophthalmic solution Place 1 drop into both eyes daily.       [name] levothyroxine (SYNTHROID, LEVOTHROID) 112 MCG tablet Take 112 mcg by mouth daily.       . metoprolol succinate (TOPROL-XL) 25 MG 24  hr tablet Take 1 tablet (25 mg total) by mouth 2 (two) times daily.  60 tablet  3  . ONE TOUCH ULTRA TEST test strip       . pantoprazole (PROTONIX) 40 MG tablet TAKE 1 TABLET BY MOUTH EVERY DAY AS NEEDED FOR ACID REFLUX  30 tablet  6  . rosuvastatin (CRESTOR) 40 MG tablet Take 1 tablet (40 mg total) by mouth daily.  90 tablet  3  . clopidogrel (PLAVIX) 75 MG tablet TAKE ONE TABLET BY MOUTH DAILY  30 tablet  3  . INVOKANA 100 MG TABS TAKE 1 TABLET EVERY DAY  30 tablet  0  . KLOR-CON M20 20 MEQ tablet TAKE 1 TABLET BY MOUTH EVERY DAY  90 tablet  3  . nitroGLYCERIN (NITROSTAT) 0.4 MG SL tablet Place 0.4 mg under the tongue every 5 (five) minutes as needed. For chest pain       No current facility-administered medications for this visit.    Discontinued Meds:    Medications Discontinued During This Encounter  Medication Reason  . Liraglutide (VICTOZA) 18 MG/3ML SOLN  Error    Patient Active Problem List   Diagnosis Date Noted  . Diabetes mellitus type II   . Hypothyroidism   . Hyperlipidemia   . Hypertension   . Morbid obesity   . Gastroesophageal reflux disease   . Irritable bowel syndrome   . Arteriosclerotic cardiovascular disease (ASCVD) 04/19/2011  . Anemia 04/13/2011    LABS    Component Value Date/Time   NA 133* 10/19/2011 0445   NA 134* 10/18/2011 0723   NA 136 10/11/2011 1355   K 4.5 10/19/2011 0445   K 4.4 10/18/2011 0723   K 4.2 10/11/2011 1355   CL 98 10/19/2011 0445   CL 99 10/18/2011 0723   CL 98 10/11/2011 1355   CO2 26 10/19/2011 0445   CO2 25 10/18/2011 0723   CO2 25 10/11/2011 1355   GLUCOSE 216* 10/19/2011 0445   GLUCOSE 249* 10/18/2011 0723   GLUCOSE 438* 10/15/2011 1401   BUN 13 10/19/2011 0445   BUN 10 10/18/2011 0723   BUN 9 10/11/2011 1355   CREATININE 0.68 10/19/2011 0445   CREATININE 0.59 10/18/2011 0723   CREATININE 0.58 10/11/2011 1355   CALCIUM 9.7 10/19/2011 0445   CALCIUM 9.4 10/18/2011 0723   CALCIUM 9.2 10/11/2011 1355   GFRNONAA >90 10/19/2011 0445   GFRNONAA >90  10/18/2011 0723   GFRNONAA >90 10/11/2011 1355   GFRAA >90 10/19/2011 0445   GFRAA >90 10/18/2011 0723   GFRAA >90 10/11/2011 1355   CMP     Component Value Date/Time   NA 133* 10/19/2011 0445   K 4.5 10/19/2011 0445   CL 98 10/19/2011 0445   CO2 26 10/19/2011 0445   GLUCOSE 216* 10/19/2011 0445   BUN 13 10/19/2011 0445   CREATININE 0.68 10/19/2011 0445   CALCIUM 9.7 10/19/2011 0445   PROT 6.4 11/24/2010 1002   ALBUMIN 3.0* 11/24/2010 1002   AST 25 04/13/2011 0856   AST 21 11/24/2010 1002   ALT 18 04/13/2011 0856   ALKPHOS 77 04/13/2011 0856   ALKPHOS 60 11/24/2010 1002   BILITOT 0.4 04/13/2011 0856   BILITOT 0.4 11/24/2010 1002   GFRNONAA >90 10/19/2011 0445   GFRAA >90 10/19/2011 0445       Component Value Date/Time   WBC 8.8 10/19/2011 0445   WBC 6.6 10/18/2011 0723   WBC 6.7 10/11/2011 1355   HGB 12.8 10/19/2011 0445   HGB 13.7 10/18/2011 0723   HGB 13.6 10/11/2011 1355   HCT 37.9 10/19/2011 0445   HCT 40.9 10/18/2011 0723   HCT 39.7 10/11/2011 1355   HCT 41 04/13/2011 0846   MCV 89.4 10/19/2011 0445   MCV 89.3 10/18/2011 0723   MCV 89.0 10/11/2011 1355   MCV 91.1 04/13/2011 0846    Lipid Panel  No results found for this basename: chol, trig, hdl, cholhdl, vldl, ldlcalc    ABG    Component Value Date/Time   PHART 7.449* 10/15/2011 1054   PCO2ART 36.5 10/15/2011 1054   PO2ART 84.0 10/15/2011 1054   HCO3 25.3* 10/15/2011 1054   TCO2 26 10/15/2011 1054   O2SAT 97.0 10/15/2011 1054     Lab Results  Component Value Date   TSH 1.301 11/24/2010   BNP (last 3 results) No results found for this basename: PROBNP,  in the last 8760 hours Cardiac Panel (last 3 results) No results found for this basename: CKTOTAL, CKMB, TROPONINI, RELINDX,  in the last 72 hours  Iron/TIBC/Ferritin No results found for this basename: iron, tibc, ferritin  EKG Orders placed during the hospital encounter of 10/18/11  . EKG 12-LEAD  . EKG 12-LEAD  . EKG     Prior Assessment and Plan Problem List as of 03/29/2013     Cardiovascular  and Mediastinum   Arteriosclerotic cardiovascular disease (ASCVD)   Last Assessment & Plan   05/08/2012 Office Visit Written 05/10/2012 10:07 AM by Kathlen Brunswick, MD     The patient has done well since intervention in 2 saphenous vein grafts with DES.  Dual anti-platelet therapy will be continued for a total of 12 months.  Risk factor management will be optimized.    Hypertension   Last Assessment & Plan   05/08/2012 Office Visit Written 05/10/2012 10:09 AM by Kathlen Brunswick, MD     Blood pressure control has been excellent over the past 12 months whenever assessed in medical settings; current therapy will be continued.      Digestive   Gastroesophageal reflux disease   Irritable bowel syndrome     Endocrine   Diabetes mellitus type II   Last Assessment & Plan   05/08/2012 Office Visit Edited 05/10/2012 10:14 AM by Kathlen Brunswick, MD     Recent A1c level of 14.8.  Dr. Fransico Him is working closely with patient to improve diabetic control.    Hypothyroidism   Last Assessment & Plan   05/08/2012 Office Visit Written 05/10/2012 10:12 AM by Kathlen Brunswick, MD     Recent PFTs demonstrate normal thyroid function.      Other   Anemia   Last Assessment & Plan   05/08/2012 Office Visit Written 05/10/2012 10:06 AM by Kathlen Brunswick, MD     Most recent CBC in 09/2011 was entirely normal; anemia has resolved.    Hyperlipidemia   Last Assessment & Plan   05/08/2012 Office Visit Edited 05/14/2012  1:20 PM by Kathlen Brunswick, MD     Adequately controlled hyperlipidemia.  Low HDL and elevated triglycerides reflect suboptimal management of diabetes, which is currently being addressed by Dr. Fransico Him.    Morbid obesity   Last Assessment & Plan   10/28/2011 Office Visit Written 10/28/2011  5:18 PM by Kathlen Brunswick, MD     Patient is interested in bariatric surgery and will be referred to that program.  She will not be ready for surgery for at least 6 months as a result of recent cardiac  intervention and the need to continue dual antiplatelet therapy.        Imaging: No results found.
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
## 2013-03-29 NOTE — Patient Instructions (Signed)

## 2013-03-29 NOTE — Assessment & Plan Note (Signed)
Well controlled at present. She will continue on current regimen. She is followed by PCP for labs.

## 2013-03-30 ENCOUNTER — Other Ambulatory Visit: Payer: Self-pay | Admitting: *Deleted

## 2013-03-30 NOTE — Addendum Note (Signed)
Addended by: Thompson Grayer on: 03/30/2013 10:22 AM   Modules accepted: Orders

## 2013-04-04 ENCOUNTER — Emergency Department (HOSPITAL_COMMUNITY): Payer: BC Managed Care – PPO

## 2013-04-04 ENCOUNTER — Encounter (HOSPITAL_COMMUNITY): Payer: Self-pay | Admitting: *Deleted

## 2013-04-04 ENCOUNTER — Emergency Department (HOSPITAL_COMMUNITY)
Admission: EM | Admit: 2013-04-04 | Discharge: 2013-04-04 | Disposition: A | Discharge status: Home / Self Care | Payer: BC Managed Care – PPO | Attending: Emergency Medicine | Admitting: Emergency Medicine

## 2013-04-04 DIAGNOSIS — M7530 Calcific tendinitis of unspecified shoulder: Secondary | ICD-10-CM | POA: Insufficient documentation

## 2013-04-04 DIAGNOSIS — M545 Low back pain, unspecified: Secondary | ICD-10-CM | POA: Insufficient documentation

## 2013-04-04 DIAGNOSIS — R079 Chest pain, unspecified: Secondary | ICD-10-CM | POA: Insufficient documentation

## 2013-04-04 DIAGNOSIS — E039 Hypothyroidism, unspecified: Secondary | ICD-10-CM | POA: Insufficient documentation

## 2013-04-04 DIAGNOSIS — M25519 Pain in unspecified shoulder: Secondary | ICD-10-CM | POA: Insufficient documentation

## 2013-04-04 DIAGNOSIS — I10 Essential (primary) hypertension: Secondary | ICD-10-CM | POA: Insufficient documentation

## 2013-04-04 DIAGNOSIS — Z79899 Other long term (current) drug therapy: Secondary | ICD-10-CM | POA: Insufficient documentation

## 2013-04-04 DIAGNOSIS — K219 Gastro-esophageal reflux disease without esophagitis: Secondary | ICD-10-CM | POA: Insufficient documentation

## 2013-04-04 DIAGNOSIS — M7532 Calcific tendinitis of left shoulder: Secondary | ICD-10-CM

## 2013-04-04 DIAGNOSIS — Z7982 Long term (current) use of aspirin: Secondary | ICD-10-CM | POA: Insufficient documentation

## 2013-04-04 DIAGNOSIS — M25512 Pain in left shoulder: Secondary | ICD-10-CM

## 2013-04-04 DIAGNOSIS — IMO0002 Reserved for concepts with insufficient information to code with codable children: Secondary | ICD-10-CM | POA: Insufficient documentation

## 2013-04-04 DIAGNOSIS — E119 Type 2 diabetes mellitus without complications: Secondary | ICD-10-CM | POA: Insufficient documentation

## 2013-04-04 DIAGNOSIS — F341 Dysthymic disorder: Secondary | ICD-10-CM | POA: Insufficient documentation

## 2013-04-04 DIAGNOSIS — G8929 Other chronic pain: Secondary | ICD-10-CM | POA: Insufficient documentation

## 2013-04-04 DIAGNOSIS — Z8669 Personal history of other diseases of the nervous system and sense organs: Secondary | ICD-10-CM | POA: Insufficient documentation

## 2013-04-04 DIAGNOSIS — Z8739 Personal history of other diseases of the musculoskeletal system and connective tissue: Secondary | ICD-10-CM | POA: Insufficient documentation

## 2013-04-04 DIAGNOSIS — I2510 Atherosclerotic heart disease of native coronary artery without angina pectoris: Secondary | ICD-10-CM | POA: Insufficient documentation

## 2013-04-04 DIAGNOSIS — Z794 Long term (current) use of insulin: Secondary | ICD-10-CM | POA: Insufficient documentation

## 2013-04-04 DIAGNOSIS — E785 Hyperlipidemia, unspecified: Secondary | ICD-10-CM | POA: Insufficient documentation

## 2013-04-04 DIAGNOSIS — Z8719 Personal history of other diseases of the digestive system: Secondary | ICD-10-CM | POA: Insufficient documentation

## 2013-04-04 MED ORDER — IBUPROFEN 400 MG PO TABS
400.0000 mg | ORAL_TABLET | Freq: Once | ORAL | Status: AC
  Administered 2013-04-04: 400 mg via ORAL
  Filled 2013-04-04: qty 1

## 2013-04-04 MED ORDER — LORAZEPAM 1 MG PO TABS
1.0000 mg | ORAL_TABLET | Freq: Once | ORAL | Status: AC
  Administered 2013-04-04: 1 mg via ORAL
  Filled 2013-04-04: qty 1

## 2013-04-04 MED ORDER — OXYCODONE-ACETAMINOPHEN 5-325 MG PO TABS
2.0000 | ORAL_TABLET | Freq: Once | ORAL | Status: AC
  Administered 2013-04-04: 2 via ORAL
  Filled 2013-04-04: qty 2

## 2013-04-04 NOTE — ED Notes (Signed)
Patient transported to X-ray 
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
## 2013-04-04 NOTE — ED Notes (Signed)
Pt was in [place] in June and here with left shoulder pain that has been hurting for one week.  Pt has some discomfort in chest and feels like she cannot catch her breath.  Pt diaphoretic on arrival to triage room.

## 2013-05-01 ENCOUNTER — Encounter (HOSPITAL_COMMUNITY): Payer: Self-pay | Admitting: Pharmacy Technician

## 2013-05-02 NOTE — Patient Instructions (Addendum)
Your procedure is scheduled on:  05/08/2013  Report to [facility] at   7:00   AM.  Call this number if you have problems the morning of surgery: [phone]   Remember:   Do not eat or drink :After Midnight.    Take these medicines the morning of surgery with A SIP OF WATER: Albuterol, Xanax, Advair, Metoprolol,protonix, and Levothyroxine   Do not wear jewelry, make-up or nail polish.  Do not wear lotions, powders, or perfumes. You may wear deodorant.  Do not shave 48 hours prior to surgery.  Do not bring valuables to the hospital.  Contacts, dentures or bridgework may not be worn into surgery.  Patients discharged the day of surgery will not be allowed to drive home.  Name and phone number of your driver:    Please read over the following fact sheets that you were given: Pain Booklet, Surgical Site Infection Prevention, Anesthesia Post-op Instructions and Care and Recovery After Surgery  Cataract Surgery  A cataract is a clouding of the lens of the eye. When a lens becomes cloudy, vision is reduced based on the degree and nature of the clouding. Surgery may be needed to improve vision. Surgery removes the cloudy lens and usually replaces it with a substitute lens (intraocular lens, IOL). LET YOUR EYE DOCTOR KNOW ABOUT:  Allergies to food or medicine.   Medicines taken including herbs, eyedrops, over-the-counter medicines, and creams.   Use of steroids (by mouth or creams).   Previous problems with anesthetics or numbing medicine.   History of bleeding problems or blood clots.   Previous surgery.   Other health problems, including diabetes and kidney problems.   Possibility of pregnancy, if this applies.  RISKS AND COMPLICATIONS  Infection.   Inflammation of the eyeball (endophthalmitis) that can spread to both eyes (sympathetic ophthalmia).   Poor wound healing.   If an IOL is inserted, it can later fall out of proper position. This is very uncommon.   Clouding of  the part of your eye that holds an IOL in place. This is called an "after-cataract." These are uncommon, but easily treated.  BEFORE THE PROCEDURE  Do not eat or drink anything except small amounts of water for 8 to 12 before your surgery, or as directed by your caregiver.   Unless you are told otherwise, continue any eyedrops you have been prescribed.   Talk to your primary caregiver about all other medicines that you take (both prescription and non-prescription). In some cases, you may need to stop or change medicines near the time of your surgery. This is most important if you are taking blood-thinning medicine.Do not stop medicines unless you are told to do so.   Arrange for someone to drive you to and from the procedure.   Do not put contact lenses in either eye on the day of your surgery.  PROCEDURE There is more than one method for safely removing a cataract. Your doctor can explain the differences and help determine which is best for you. Phacoemulsification surgery is the most common form of cataract surgery.  An injection is given behind the eye or eyedrops are given to make this a painless procedure.   A small cut (incision) is made on the edge of the clear, dome-shaped surface that covers the front of the eye (cornea).   A tiny probe is painlessly inserted into the eye. This device gives off ultrasound waves that soften and break up the cloudy center of the lens.  This makes it easier for the cloudy lens to be removed by suction.   An IOL may be implanted.   The normal lens of the eye is covered by a clear capsule. Part of that capsule is intentionally left in the eye to support the IOL.   Your surgeon may or may not use stitches to close the incision.  There are other forms of cataract surgery that require a larger incision and stiches to close the eye. This approach is taken in cases where the doctor feels that the cataract cannot be easily removed using  phacoemulsification. AFTER THE PROCEDURE  When an IOL is implanted, it does not need care. It becomes a permanent part of your eye and cannot be seen or felt.   Your doctor will schedule follow-up exams to check on your progress.   Review your other medicines with your doctor to see which can be resumed after surgery.   Use eyedrops or take medicine as prescribed by your doctor.  Document Released: 08/19/2011 Document Reviewed: 08/16/2011 [facility] Patient Information 2012 [place], [place].  .Cataract Surgery Care After Refer to this sheet in the next few weeks. These instructions provide you with information on caring for yourself after your procedure. Your caregiver may also give you more specific instructions. Your treatment has been planned according to current medical practices, but problems sometimes occur. Call your caregiver if you have any problems or questions after your procedure.  HOME CARE INSTRUCTIONS   Avoid strenuous activities as directed by your caregiver.   Ask your caregiver when you can resume driving.   Use eyedrops or other medicines to help healing and control pressure inside your eye as directed by your caregiver.   Only take over-the-counter or prescription medicines for pain, discomfort, or fever as directed by your caregiver.   Do not to touch or rub your eyes.   You may be instructed to use a protective shield during the first few days and nights after surgery. If not, wear sunglasses to protect your eyes. This is to protect the eye from pressure or from being accidentally bumped.   Keep the area around your eye clean and dry. Avoid swimming or allowing water to hit you directly in the face while showering. Keep soap and shampoo out of your eyes.   Do not bend or lift heavy objects. Bending increases pressure in the eye. You can walk, climb stairs, and do light household chores.   Do not put a contact lens into the eye that had surgery until your caregiver  says it is okay to do so.   Ask your doctor when you can return to work. This will depend on the kind of work that you do. If you work in a dusty environment, you may be advised to wear protective eyewear for a period of time.   Ask your caregiver when it will be safe to engage in sexual activity.   Continue with your regular eye exams as directed by your caregiver.  What to expect:  It is normal to feel itching and mild discomfort for a few days after cataract surgery. Some fluid discharge is also common, and your eye may be sensitive to light and touch.   After 1 to 2 days, even moderate discomfort should disappear. In most cases, healing will take about 6 weeks.   If you received an intraocular lens (IOL), you may notice that colors are very bright or have a blue tinge. Also, if you have been in  bright sunlight, everything may appear reddish for a few hours. If you see these color tinges, it is because your lens is clear and no longer cloudy. Within a few months after receiving an IOL, these extra colors should go away. When you have healed, you will probably need new glasses.  SEEK MEDICAL CARE IF:   You have increased bruising around your eye.   You have discomfort not helped by medicine.  SEEK IMMEDIATE MEDICAL CARE IF:   You have a fever.   You have a worsening or sudden vision loss.   You have redness, swelling, or increasing pain in the eye.   You have a thick discharge from the eye that had surgery.  MAKE SURE YOU:  Understand these instructions.   Will watch your condition.   Will get help right away if you are not doing well or get worse.  Document Released: 03/19/2005 Document Revised: 08/19/2011 Document Reviewed: 04/23/2011 [facility] Patient Information 2012 [place], [place].

## 2013-05-03 ENCOUNTER — Other Ambulatory Visit: Payer: Self-pay | Admitting: Ophthalmology

## 2013-05-03 ENCOUNTER — Encounter (HOSPITAL_COMMUNITY)
Admission: RE | Admit: 2013-05-03 | Discharge: 2013-05-03 | Disposition: A | Discharge status: Home / Self Care | Payer: BC Managed Care – PPO | Source: Ambulatory Visit | Attending: Ophthalmology | Admitting: Ophthalmology

## 2013-05-03 ENCOUNTER — Encounter (HOSPITAL_COMMUNITY): Payer: Self-pay

## 2013-05-03 DIAGNOSIS — Z01812 Encounter for preprocedural laboratory examination: Secondary | ICD-10-CM | POA: Insufficient documentation

## 2013-05-03 LAB — BASIC METABOLIC PANEL
Calcium: 10 mg/dL (ref 8.4–10.5)
Chloride: 96 mEq/L (ref 96–112)
Creatinine, Ser: 0.67 mg/dL (ref 0.50–1.10)
GFR calc Af Amer: >90 mL/min (ref >90)
Sodium: 138 mEq/L (ref 135–145)

## 2013-05-03 LAB — HEMOGLOBIN AND HEMATOCRIT, BLOOD
HCT: 43.3 % (ref 36.0–46.0)
Hemoglobin: 14.7 g/dL (ref 12.0–15.0)

## 2013-05-08 ENCOUNTER — Encounter (HOSPITAL_COMMUNITY): Payer: Self-pay | Admitting: Anesthesiology

## 2013-05-08 ENCOUNTER — Encounter (HOSPITAL_COMMUNITY): Payer: Self-pay | Admitting: *Deleted

## 2013-05-08 ENCOUNTER — Ambulatory Visit (HOSPITAL_COMMUNITY): Payer: BC Managed Care – PPO | Admitting: Anesthesiology

## 2013-05-08 ENCOUNTER — Ambulatory Visit (HOSPITAL_COMMUNITY)
Admission: RE | Admit: 2013-05-08 | Discharge: 2013-05-08 | Disposition: A | Discharge status: Home / Self Care | Payer: BC Managed Care – PPO | Source: Ambulatory Visit | Attending: Ophthalmology | Admitting: Ophthalmology

## 2013-05-08 ENCOUNTER — Encounter (HOSPITAL_COMMUNITY)
Admission: RE | Disposition: A | Discharge status: Home / Self Care | Payer: Self-pay | Source: Ambulatory Visit | Attending: Ophthalmology

## 2013-05-08 DIAGNOSIS — H2510 Age-related nuclear cataract, unspecified eye: Secondary | ICD-10-CM | POA: Insufficient documentation

## 2013-05-08 DIAGNOSIS — I10 Essential (primary) hypertension: Secondary | ICD-10-CM | POA: Insufficient documentation

## 2013-05-08 DIAGNOSIS — Z951 Presence of aortocoronary bypass graft: Secondary | ICD-10-CM | POA: Insufficient documentation

## 2013-05-08 DIAGNOSIS — Z01812 Encounter for preprocedural laboratory examination: Secondary | ICD-10-CM | POA: Insufficient documentation

## 2013-05-08 DIAGNOSIS — E119 Type 2 diabetes mellitus without complications: Secondary | ICD-10-CM | POA: Insufficient documentation

## 2013-05-08 HISTORY — PX: CATARACT EXTRACTION W/PHACO: SHX586

## 2013-05-08 LAB — GLUCOSE, CAPILLARY: Glucose-Capillary: 167 mg/dL — ABNORMAL HIGH (ref 70–99)

## 2013-05-08 SURGERY — PHACOEMULSIFICATION, CATARACT, WITH IOL INSERTION
Anesthesia: Monitor Anesthesia Care | Site: Eye | Laterality: Right | Wound class: Clean

## 2013-05-08 MED ORDER — MIDAZOLAM HCL 2 MG/2ML IJ SOLN
1.0000 mg | INTRAMUSCULAR | Status: DC | PRN
  Administered 2013-05-08: 2 mg via INTRAVENOUS

## 2013-05-08 MED ORDER — FLURBIPROFEN SODIUM 0.03 % OP SOLN
OPHTHALMIC | Status: AC
  Filled 2013-05-08: qty 2.5

## 2013-05-08 MED ORDER — PHENYLEPHRINE HCL 2.5 % OP SOLN
OPHTHALMIC | Status: AC
  Filled 2013-05-08: qty 2

## 2013-05-08 MED ORDER — CYCLOPENTOLATE-PHENYLEPHRINE 0.2-1 % OP SOLN
OPHTHALMIC | Status: AC
  Filled 2013-05-08: qty 2

## 2013-05-08 MED ORDER — EPINEPHRINE HCL 1 MG/ML IJ SOLN
INTRAMUSCULAR | Status: AC
  Filled 2013-05-08: qty 1

## 2013-05-08 MED ORDER — CYCLOPENTOLATE-PHENYLEPHRINE 0.2-1 % OP SOLN
1.0000 [drp] | OPHTHALMIC | Status: AC
  Administered 2013-05-08 (×3): 1 [drp] via OPHTHALMIC

## 2013-05-08 MED ORDER — LACTATED RINGERS IV SOLN
INTRAVENOUS | Status: DC
  Administered 2013-05-08: 08:00:00 via INTRAVENOUS

## 2013-05-08 MED ORDER — FENTANYL CITRATE 0.05 MG/ML IJ SOLN
INTRAMUSCULAR | Status: AC
  Filled 2013-05-08: qty 2

## 2013-05-08 MED ORDER — PROVISC 10 MG/ML IO SOLN
INTRAOCULAR | Status: DC | PRN
  Administered 2013-05-08: 8.5 mg via INTRAOCULAR

## 2013-05-08 MED ORDER — BSS IO SOLN
INTRAOCULAR | Status: DC | PRN
  Administered 2013-05-08: 15 mL via INTRAOCULAR

## 2013-05-08 MED ORDER — PHENYLEPHRINE HCL 2.5 % OP SOLN
1.0000 [drp] | OPHTHALMIC | Status: AC
  Administered 2013-05-08 (×3): 1 [drp] via OPHTHALMIC

## 2013-05-08 MED ORDER — FLURBIPROFEN SODIUM 0.03 % OP SOLN
1.0000 [drp] | OPHTHALMIC | Status: AC
  Administered 2013-05-08: 1 [drp] via OPHTHALMIC

## 2013-05-08 MED ORDER — FENTANYL CITRATE 0.05 MG/ML IJ SOLN
25.0000 ug | INTRAMUSCULAR | Status: AC
  Administered 2013-05-08: 25 ug via INTRAVENOUS

## 2013-05-08 MED ORDER — EPINEPHRINE HCL 1 MG/ML IJ SOLN
INTRAOCULAR | Status: DC | PRN
  Administered 2013-05-08: 08:00:00

## 2013-05-08 MED ORDER — MIDAZOLAM HCL 2 MG/2ML IJ SOLN
INTRAMUSCULAR | Status: AC
  Filled 2013-05-08: qty 2

## 2013-05-08 MED ORDER — TETRACAINE 0.5 % OP SOLN OPTIME - NO CHARGE
OPHTHALMIC | Status: DC | PRN
  Administered 2013-05-08: 2 [drp] via OPHTHALMIC

## 2013-05-08 MED ORDER — TETRACAINE HCL 0.5 % OP SOLN
OPHTHALMIC | Status: AC
  Filled 2013-05-08: qty 2

## 2013-05-08 MED ORDER — TETRACAINE HCL 0.5 % OP SOLN
1.0000 [drp] | OPHTHALMIC | Status: AC
  Administered 2013-05-08 (×3): 1 [drp] via OPHTHALMIC

## 2013-05-08 SURGICAL SUPPLY — 23 items
CAPSULAR TENSION RING-AMO (OPHTHALMIC RELATED) IMPLANT
CLOTH BEACON ORANGE TIMEOUT ST (SAFETY) ×2 IMPLANT
EYE SHIELD UNIVERSAL CLEAR (GAUZE/BANDAGES/DRESSINGS) ×2 IMPLANT
GLOVE BIO SURGEON STRL SZ 6.5 (GLOVE) ×2 IMPLANT
GLOVE ECLIPSE 6.5 STRL STRAW (GLOVE) IMPLANT
GLOVE ECLIPSE 7.0 STRL STRAW (GLOVE) IMPLANT
GLOVE EXAM NITRILE LRG STRL (GLOVE) IMPLANT
GLOVE EXAM NITRILE MD LF STRL (GLOVE) ×2 IMPLANT
GLOVE SKINSENSE NS SZ6.5 (GLOVE)
GLOVE SKINSENSE STRL SZ6.5 (GLOVE) IMPLANT
HEALON 5 0.6 ML (INTRAOCULAR LENS) IMPLANT
KIT VITRECTOMY (OPHTHALMIC RELATED) IMPLANT
PAD ARMBOARD 7.5X6 YLW CONV (MISCELLANEOUS) ×2 IMPLANT
PROC W NO LENS (INTRAOCULAR LENS)
PROC W SPEC LENS (INTRAOCULAR LENS)
PROCESS W NO LENS (INTRAOCULAR LENS) IMPLANT
PROCESS W SPEC LENS (INTRAOCULAR LENS) IMPLANT
RING MALYGIN (MISCELLANEOUS) IMPLANT
SIGHTPATH CAT PROC W REG LENS (Ophthalmic Related) ×2 IMPLANT
TAPE SURG TRANSPORE 1 IN (GAUZE/BANDAGES/DRESSINGS) ×1 IMPLANT
TAPE SURGICAL TRANSPORE 1 IN (GAUZE/BANDAGES/DRESSINGS) ×1
VISCOELASTIC ADDITIONAL (OPHTHALMIC RELATED) IMPLANT
WATER STERILE IRR 250ML POUR (IV SOLUTION) ×2 IMPLANT

## 2013-05-08 NOTE — Anesthesia Preprocedure Evaluation (Signed)
Anesthesia Evaluation  Patient identified by MRN, date of birth, ID band Patient awake    Reviewed: Allergy & Precautions, H&P , NPO status , Patient's Chart, lab work & pertinent test results, reviewed documented beta blocker date and time   Airway Mallampati: III    Mouth opening: Limited Mouth Opening  Dental  (+) Teeth Intact   Pulmonary shortness of breath, asthma ,  breath sounds clear to auscultation        Cardiovascular hypertension, + CAD and + CABG Rhythm:Regular Rate:Normal     Neuro/Psych PSYCHIATRIC DISORDERS Anxiety    GI/Hepatic GERD-  Medicated,  Endo/Other  diabetes, Type 2Hypothyroidism   Renal/GU      Musculoskeletal   Abdominal (+) + obese,   Peds  Hematology   Anesthesia Other Findings   Reproductive/Obstetrics                           Anesthesia Physical Anesthesia Plan  ASA: III  Anesthesia Plan: MAC   Post-op Pain Management:    Induction: Intravenous  Airway Management Planned: Nasal Cannula  Additional Equipment:   Intra-op Plan:   Post-operative Plan:   Informed Consent: I have reviewed the patients History and Physical, chart, labs and discussed the procedure including the risks, benefits and alternatives for the proposed anesthesia with the patient or authorized representative who has indicated his/her understanding and acceptance.     Plan Discussed with:   Anesthesia Plan Comments:         Anesthesia Quick Evaluation

## 2013-05-08 NOTE — Transfer of Care (Signed)
Immediate Anesthesia Transfer of Care Note  Patient: [name]  Procedure(s) Performed: Procedure(s) with comments: CATARACT EXTRACTION PHACO AND INTRAOCULAR LENS PLACEMENT (IOC) (Right) - CDE:  3.08  Patient Location: Short Stay  Anesthesia Type:MAC  Level of Consciousness: awake  Airway & Oxygen Therapy: Patient Spontanous Breathing  Post-op Assessment: Report given to PACU RN  Post vital signs: Reviewed  Complications: No apparent anesthesia complications

## 2013-05-08 NOTE — Anesthesia Postprocedure Evaluation (Signed)
  Anesthesia Post-op Note  Patient: [name]  Procedure(s) Performed: Procedure(s) with comments: CATARACT EXTRACTION PHACO AND INTRAOCULAR LENS PLACEMENT (IOC) (Right) - CDE:  3.08  Patient Location: Short Stay  Anesthesia Type:MAC  Level of Consciousness: awake, alert  and oriented  Airway and Oxygen Therapy: Patient Spontanous Breathing  Post-op Pain: none  Post-op Assessment: Post-op Vital signs reviewed, Patient's Cardiovascular Status Stable, Respiratory Function Stable, Patent Airway and No signs of Nausea or vomiting  Post-op Vital Signs: Reviewed and stable  Complications: No apparent anesthesia complications

## 2013-05-08 NOTE — Op Note (Signed)
Patient brought to the operating room and prepped and draped in the usual manner.  Lid speculum inserted in right eye.  Stab incision made at the twelve o'clock position.  Provisc instilled in the anterior chamber.   A 2.4 mm. Stab incision was made temporally.  An anterior capsulotomy was done with a bent 25 gauge needle.  The nucleus was hydrodissected.  The Phaco tip was inserted in the anterior chamber and the nucleus was emulsified.  CDE was 3.08.  The cortical material was then removed with the I and A tip.  Posterior capsule was the polished.  The anterior chamber was deepened with Provisc.  A 25.0 Diopter Rayner 570C IOL was then inserted in the capsular bag.  Provisc was then removed with the I and A tip.  The wound was then hydrated.  Patient sent to the Recovery Room in good condition with follow up in my office.  Preoperative Diagnosis:  Nuclear Cataract OD Postoperative Diagnosis:  Same Procedure name: Kelman Phacoemulsification OD with IOL

## 2013-05-08 NOTE — H&P (Signed)
The patient was re examined and there is no change in the patients condition since the original H and P. 

## 2013-05-10 ENCOUNTER — Encounter (HOSPITAL_COMMUNITY): Payer: Self-pay | Admitting: Ophthalmology

## 2013-05-23 ENCOUNTER — Other Ambulatory Visit: Payer: Self-pay | Admitting: Family Medicine

## 2013-05-30 ENCOUNTER — Other Ambulatory Visit: Payer: Self-pay | Admitting: Family Medicine

## 2013-05-30 NOTE — Telephone Encounter (Signed)
Ok plus three ref 

## 2013-05-31 ENCOUNTER — Other Ambulatory Visit: Payer: Self-pay | Admitting: *Deleted

## 2013-05-31 MED ORDER — DIPHENOXYLATE-ATROPINE 2.5-0.025 MG PO TABS: 1.0000 | ORAL_TABLET | Freq: Four times a day (QID) | ORAL | Status: DC | PRN

## 2013-06-14 ENCOUNTER — Telehealth: Payer: Self-pay | Admitting: Family Medicine

## 2013-06-14 NOTE — Telephone Encounter (Signed)
Patient is requesting to have the doctor or Eber Jones call her back regarding some questions she has. That is all the information they would give me.

## 2013-06-14 NOTE — Telephone Encounter (Signed)
Pt states she would like to get a letter stating she needs to use a walker and cane. She already uses both. She stated Dr. Brett Canales talked with her in the past about using a cane and walker. Also her disability lawyer will be calling the office Mrs. Brod and would like to know who she needs to speak to when she calls.

## 2013-06-14 NOTE — Telephone Encounter (Signed)
i need pt's chart 

## 2013-06-15 ENCOUNTER — Telehealth: Payer: Self-pay | Admitting: Family Medicine

## 2013-06-15 NOTE — Telephone Encounter (Signed)
See other message

## 2013-06-15 NOTE — Telephone Encounter (Signed)
Chart in message pile 

## 2013-06-15 NOTE — Telephone Encounter (Signed)
Patient scheduled office visit to discuss 

## 2013-06-15 NOTE — Telephone Encounter (Signed)
Left message to return call 

## 2013-06-15 NOTE — Telephone Encounter (Signed)
Patient would like for the nurse to call her back regarding the return call back to her yesterday.

## 2013-06-15 NOTE — Telephone Encounter (Signed)
As noted need ov--not seen this yr--no way we can advise attorney's off ice on anything without visit

## 2013-06-19 ENCOUNTER — Ambulatory Visit (INDEPENDENT_AMBULATORY_CARE_PROVIDER_SITE_OTHER): Payer: Medicare Other | Admitting: Family Medicine

## 2013-06-19 ENCOUNTER — Encounter: Payer: Self-pay | Admitting: Family Medicine

## 2013-06-19 VITALS — BP 130/88 | Ht 64.0 in | Wt 272.0 lb

## 2013-06-19 DIAGNOSIS — E785 Hyperlipidemia, unspecified: Secondary | ICD-10-CM

## 2013-06-19 DIAGNOSIS — E039 Hypothyroidism, unspecified: Secondary | ICD-10-CM

## 2013-06-19 DIAGNOSIS — D649 Anemia, unspecified: Secondary | ICD-10-CM

## 2013-06-19 DIAGNOSIS — I10 Essential (primary) hypertension: Secondary | ICD-10-CM

## 2013-06-19 DIAGNOSIS — Z23 Encounter for immunization: Secondary | ICD-10-CM

## 2013-06-19 DIAGNOSIS — K589 Irritable bowel syndrome without diarrhea: Secondary | ICD-10-CM

## 2013-06-19 NOTE — Progress Notes (Signed)
  Subjective:    Patient ID: [name], female    DOB: 09-Oct-1950, 62 y.o.   MRN: [id number]  HPI Patient is here today to get the disability application filled out.  She needs a refill on Oxycodone.  She sees an endocrinologist and will have her A1C rechecked in November.   Also wants to discuss her lower back pain.low back pain, downward, sense of leg giving out. Pain radiates to left leg. Often develops radiating pain down the left leg. No major numbness. Requires a cane to get around. Sometimes during flares uses a complete walker.  Deep ache in back.  On amox for gums, saw the dentist  Takes ibuprofen regularly  Chronic diarrhea ongoing aggravation, took round of antibiotics.  Haven't worked for four yrs,   Often uses a cane and/or a walker per to get around.     Review of Systems In and in no chest pain . No abdominal pain. No major shortness of breath.  ROS otherwise negative.    Objective:   Physical Exam  Alert significant obesity present. HEENT normal. Lungs no wheezes. Heart regular in rhythm. Abdomen benign. Ankles trace edema. Positive low back pain to percussion. Plus minus straight leg raise bilateral. Vital stable      Assessment & Plan:  Impression 1 asthma clinically stable. #2 hypertension good control. #3 endocrinological concerns now followed by specialist. #4 chronic back pain discussed. #5 reflux stable. Plan oxycodone refilled. Form filled out. Diet discussed. Exercise discussed. Flu shot in encourage. Followup every 6 months faithfully. Continue to followup with all her specialist. WSL

## 2013-06-25 ENCOUNTER — Other Ambulatory Visit: Payer: Self-pay | Admitting: Family Medicine

## 2013-07-19 ENCOUNTER — Other Ambulatory Visit: Payer: Self-pay | Admitting: Family Medicine

## 2013-08-17 ENCOUNTER — Ambulatory Visit (INDEPENDENT_AMBULATORY_CARE_PROVIDER_SITE_OTHER): Payer: BC Managed Care – PPO | Admitting: Adult Health

## 2013-08-17 ENCOUNTER — Encounter: Payer: Self-pay | Admitting: Adult Health

## 2013-08-17 ENCOUNTER — Ambulatory Visit (HOSPITAL_COMMUNITY)
Admission: RE | Admit: 2013-08-17 | Discharge: 2013-08-17 | Disposition: A | Discharge status: Home / Self Care | Payer: BC Managed Care – PPO | Source: Ambulatory Visit | Attending: Adult Health | Admitting: Adult Health

## 2013-08-17 VITALS — BP 153/87 | HR 88 | Ht 64.0 in | Wt 255.2 lb

## 2013-08-17 DIAGNOSIS — D649 Anemia, unspecified: Secondary | ICD-10-CM

## 2013-08-17 DIAGNOSIS — I10 Essential (primary) hypertension: Secondary | ICD-10-CM

## 2013-08-17 DIAGNOSIS — I2510 Atherosclerotic heart disease of native coronary artery without angina pectoris: Secondary | ICD-10-CM

## 2013-08-17 DIAGNOSIS — R0989 Other specified symptoms and signs involving the circulatory and respiratory systems: Secondary | ICD-10-CM | POA: Insufficient documentation

## 2013-08-17 DIAGNOSIS — I7090 Unspecified atherosclerosis: Secondary | ICD-10-CM

## 2013-08-17 DIAGNOSIS — R0609 Other forms of dyspnea: Secondary | ICD-10-CM | POA: Insufficient documentation

## 2013-08-17 DIAGNOSIS — R0602 Shortness of breath: Secondary | ICD-10-CM

## 2013-08-17 LAB — CBC
Hemoglobin: 16.5 g/dL — ABNORMAL HIGH (ref 12.0–15.0)
RBC: 5.33 MIL/uL — ABNORMAL HIGH (ref 3.87–5.11)
WBC: 8.1 10*3/uL (ref 4.0–10.5)

## 2013-08-17 MED ORDER — GUAIFENESIN ER 600 MG PO TB12: 600.0000 mg | ORAL_TABLET | Freq: Two times a day (BID) | ORAL | Status: DC

## 2013-08-17 MED ORDER — AZITHROMYCIN 500 MG PO TABS: ORAL_TABLET | ORAL | Status: DC

## 2013-08-17 NOTE — Progress Notes (Deleted)
Name: [name]    DOB: 24-Feb-1951  Age: 62 y.o.  MR#: [id number]       PCP:  Harlow Asa, MD      Insurance: Payor: BLUE CROSS BLUE SHIELD / Plan: BCBS PPO OUT OF STATE / Product Type: *No Product type* /   CC:    Chief Complaint  Patient presents with  . Coronary Artery Disease  . Hypertension  PT NOTES COUGHING AND CONGESTION STARTED 4 DAYS AGO, NOTES GREEN AND YELLOW MUCUS WHEN SHE COUGHS AND DOES NOT FEEL GOOD, PT HAS NOT TRIED ANY OTC REMEDIES, PT WAS COUGHING DURING TAKING OF BP, PT ALSO HAS APT WITH PCP ON THE 9TH   VS Filed Vitals:   08/17/13 1259  BP: 153/87  Pulse: 88  Height: 5\' 4"  (1.626 m)  Weight: 255 lb 4 oz (115.781 kg)    Weights Current Weight  08/17/13 255 lb 4 oz (115.781 kg)  06/19/13 272 lb (123.378 kg)  03/29/13 263 lb (119.296 kg)    Blood Pressure  BP Readings from Last 3 Encounters:  08/17/13 153/87  06/19/13 130/88  05/08/13 102/49     Admit date:  (Not on file) Last encounter with RMR:  03/29/2013   Allergy Review of patient's allergies indicates no known allergies.  Current Outpatient Prescriptions  Medication Sig Dispense Refill  . albuterol (PROAIR HFA) 108 (90 BASE) MCG/ACT inhaler Inhale 3 puffs into the lungs every 4 (four) hours as needed for shortness of breath.       . ALPRAZolam (XANAX) 0.5 MG tablet Take 0.5 mg by mouth at bedtime as needed. For sleep/Usually about every third night.      [name] aspirin 81 MG tablet Take 1 tablet (81 mg total) by mouth daily.      . Canagliflozin (INVOKANA) 100 MG TABS Take 100 mg by mouth daily.      . chlorhexidine (PERIDEX) 0.12 % solution       . clopidogrel (PLAVIX) 75 MG tablet Take 75 mg by mouth daily.      . diphenoxylate-atropine (LOMOTIL) 2.5-0.025 MG per tablet Take 1 tablet by mouth 4 (four) times daily as needed. For loose bowels  120 tablet  3  . Fluticasone-Salmeterol (ADVAIR) 250-50 MCG/DOSE AEPB Inhale 1 puff into the lungs 2 (two) times daily as needed.       . furosemide (LASIX)  80 MG tablet TAKE 1 TABLET EVERY DAY  30 tablet  5  . ibuprofen (ADVIL,MOTRIN) 200 MG tablet Take 600 mg by mouth 4 (four) times daily as needed for pain.      [name] insulin aspart (NOVOLOG) 100 UNIT/ML injection Inject 15-40 Units into the skin 3 times daily with meals, bedtime and 2 AM. Per sliding scale      . insulin glargine (LANTUS) 100 UNIT/ML injection Inject 70 Units into the skin at bedtime.       [name] levothyroxine (SYNTHROID, LEVOTHROID) 112 MCG tablet TAKE 1 TABLET BY MOUTH EVERY DAY  30 tablet  5  . metoprolol succinate (TOPROL-XL) 25 MG 24 hr tablet Take 1 tablet (25 mg total) by mouth 2 (two) times daily.  60 tablet  3  . nitroGLYCERIN (NITROSTAT) 0.4 MG SL tablet Place 1 tablet (0.4 mg total) under the tongue every 5 (five) minutes as needed. For chest pain  25 tablet  4  . ONE TOUCH ULTRA TEST test strip       . oxyCODONE (OXY IR/ROXICODONE) 5 MG immediate release tablet Take 5 mg  by mouth as needed for pain.      . pantoprazole (PROTONIX) 40 MG tablet Take 40 mg by mouth daily as needed (Acid reflux).      . potassium chloride SA (K-DUR,KLOR-CON) 20 MEQ tablet Take 20 mEq by mouth daily.       No current facility-administered medications for this visit.    Discontinued Meds:   There are no discontinued medications.  Patient Active Problem List   Diagnosis Date Noted  . Diabetes mellitus type II   . Hypothyroidism   . Hyperlipidemia   . Hypertension   . Morbid obesity   . Gastroesophageal reflux disease   . Irritable bowel syndrome   . Arteriosclerotic cardiovascular disease (ASCVD) 04/19/2011  . Anemia 04/13/2011    LABS    Component Value Date/Time   NA 138 05/03/2013 1015   NA 133* 10/19/2011 0445   NA 134* 10/18/2011 0723   K 4.3 05/03/2013 1015   K 4.5 10/19/2011 0445   K 4.4 10/18/2011 0723   CL 96 05/03/2013 1015   CL 98 10/19/2011 0445   CL 99 10/18/2011 0723   CO2 30 05/03/2013 1015   CO2 26 10/19/2011 0445   CO2 25 10/18/2011 0723   GLUCOSE 127* 05/03/2013 1015    GLUCOSE 216* 10/19/2011 0445   GLUCOSE 249* 10/18/2011 0723   BUN 17 05/03/2013 1015   BUN 13 10/19/2011 0445   BUN 10 10/18/2011 0723   CREATININE 0.67 05/03/2013 1015   CREATININE 0.68 10/19/2011 0445   CREATININE 0.59 10/18/2011 0723   CALCIUM 10.0 05/03/2013 1015   CALCIUM 9.7 10/19/2011 0445   CALCIUM 9.4 10/18/2011 0723   GFRNONAA >90 05/03/2013 1015   GFRNONAA >90 10/19/2011 0445   GFRNONAA >90 10/18/2011 0723   GFRAA >90 05/03/2013 1015   GFRAA >90 10/19/2011 0445   GFRAA >90 10/18/2011 0723   CMP     Component Value Date/Time   NA 138 05/03/2013 1015   K 4.3 05/03/2013 1015   CL 96 05/03/2013 1015   CO2 30 05/03/2013 1015   GLUCOSE 127* 05/03/2013 1015   BUN 17 05/03/2013 1015   CREATININE 0.67 05/03/2013 1015   CALCIUM 10.0 05/03/2013 1015   PROT 6.4 11/24/2010 1002   ALBUMIN 3.0* 11/24/2010 1002   AST 25 04/13/2011 0856   AST 21 11/24/2010 1002   ALT 18 04/13/2011 0856   ALKPHOS 77 04/13/2011 0856   ALKPHOS 60 11/24/2010 1002   BILITOT 0.4 04/13/2011 0856   BILITOT 0.4 11/24/2010 1002   GFRNONAA >90 05/03/2013 1015   GFRAA >90 05/03/2013 1015       Component Value Date/Time   WBC 8.8 10/19/2011 0445   WBC 6.6 10/18/2011 0723   WBC 6.7 10/11/2011 1355   HGB 14.7 05/03/2013 1015   HGB 12.8 10/19/2011 0445   HGB 13.7 10/18/2011 0723   HCT 43.3 05/03/2013 1015   HCT 37.9 10/19/2011 0445   HCT 40.9 10/18/2011 0723   HCT 41 04/13/2011 0846   MCV 89.4 10/19/2011 0445   MCV 89.3 10/18/2011 0723   MCV 89.0 10/11/2011 1355   MCV 91.1 04/13/2011 0846    Lipid Panel  No results found for this basename: chol, trig, hdl, cholhdl, vldl, ldlcalc    ABG    Component Value Date/Time   PHART 7.449* 10/15/2011 1054   PCO2ART 36.5 10/15/2011 1054   PO2ART 84.0 10/15/2011 1054   HCO3 25.3* 10/15/2011 1054   TCO2 26 10/15/2011 1054   O2SAT 97.0 10/15/2011 1054  Lab Results  Component Value Date   TSH 1.301 11/24/2010   BNP (last 3 results) No results found for this basename: PROBNP,  in the last 8760 hours Cardiac Panel (last 3  results) No results found for this basename: CKTOTAL, CKMB, TROPONINI, RELINDX,  in the last 72 hours  Iron/TIBC/Ferritin No results found for this basename: iron, tibc, ferritin     EKG Orders placed during the hospital encounter of 04/04/13  . EKG 12-LEAD  . EKG 12-LEAD  . ED EKG  . ED EKG  . EKG     Prior Assessment and Plan Problem List as of 08/17/2013   Anemia   Last Assessment & Plan   05/08/2012 Office Visit Written 05/10/2012 10:06 AM by Kathlen Brunswick, MD     Most recent CBC in 09/2011 was entirely normal; anemia has resolved.    Arteriosclerotic cardiovascular disease (ASCVD)   Last Assessment & Plan   03/29/2013 Office Visit Written 03/29/2013  2:44 PM by Jodelle Gross, NP     She is currently without cardiac complaint. She is medically compliant. Labs are checked by Dr. Gerda Diss.  She will continue on Plavix for another 6 months, although she could stop in August as it has been a year, she has multiple stents and therefore will wait until January to stop this medication.    Diabetes mellitus type II   Last Assessment & Plan   05/08/2012 Office Visit Edited 05/10/2012 10:14 AM by Kathlen Brunswick, MD     Recent A1c level of 14.8.  Dr. Fransico Him is working closely with patient to improve diabetic control.    Hypothyroidism   Last Assessment & Plan   05/08/2012 Office Visit Written 05/10/2012 10:12 AM by Kathlen Brunswick, MD     Recent PFTs demonstrate normal thyroid function.    Hyperlipidemia   Last Assessment & Plan   05/08/2012 Office Visit Edited 05/14/2012  1:20 PM by Kathlen Brunswick, MD     Adequately controlled hyperlipidemia.  Low HDL and elevated triglycerides reflect suboptimal management of diabetes, which is currently being addressed by Dr. Fransico Him.    Hypertension   Last Assessment & Plan   03/29/2013 Office Visit Written 03/29/2013  2:45 PM by Jodelle Gross, NP     Well controlled at present. She will continue on current regimen. She is followed by PCP  for labs.    Morbid obesity   Last Assessment & Plan   03/29/2013 Office Visit Written 03/29/2013  2:45 PM by Jodelle Gross, NP     We have discussed wt loss and her plans to increase her activity by swimming daily at the [facility]. I have encouraged her to do this    Gastroesophageal reflux disease   Irritable bowel syndrome       Imaging: No results found.

## 2013-08-17 NOTE — Patient Instructions (Addendum)
Your physician recommends that you schedule a follow-up appointment in: 1 year You will receive a reminder letter two months in advance reminding you to call and schedule your appointment. If you don't receive this letter, please contact our office.  Your physician recommends that you return for lab work todday. CBC  Your physician has recommended you make the following change in your medication:  1. START Z PAK 2. START MUCINEX 600 MG 2 TABLETS TWICE A DAY 3.STOP PLAVIX  A chest x-ray takes a picture of the organs and structures inside the chest, including the heart, lungs, and blood vessels. This test can show several things, including, whether the heart is enlarges; whether fluid is building up in the lungs; and whether pacemaker / defibrillator leads are still in place.
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
## 2013-08-17 NOTE — Assessment & Plan Note (Signed)
Lungs do not sound overly congested, but she is coughing productively. I will get a CXR, with a CBC. She will be placed on Muccinex and Z-pack. She is to see Dr.Luking next week. Copies of labs and CXR to him.

## 2013-08-17 NOTE — Assessment & Plan Note (Signed)
She has been on Plavix for over a year. Will d/c this now per [facility] Guidelines. She will see [place] in one year unless symptomatic.

## 2013-08-17 NOTE — Assessment & Plan Note (Signed)
CBC being drawn. Report to Dr. Gerda Diss.

## 2013-08-21 ENCOUNTER — Encounter: Payer: BC Managed Care – PPO | Admitting: Nurse Practitioner

## 2013-08-22 ENCOUNTER — Telehealth: Payer: Self-pay | Admitting: Nurse Practitioner

## 2013-08-22 ENCOUNTER — Ambulatory Visit (INDEPENDENT_AMBULATORY_CARE_PROVIDER_SITE_OTHER): Payer: Medicare Other | Admitting: Nurse Practitioner

## 2013-08-22 ENCOUNTER — Encounter: Payer: Self-pay | Admitting: Nurse Practitioner

## 2013-08-22 VITALS — BP 128/86 | Ht 64.0 in | Wt 275.2 lb

## 2013-08-22 DIAGNOSIS — Z0000 Encounter for general adult medical examination without abnormal findings: Secondary | ICD-10-CM

## 2013-08-22 DIAGNOSIS — J069 Acute upper respiratory infection, unspecified: Secondary | ICD-10-CM

## 2013-08-22 DIAGNOSIS — Z01419 Encounter for gynecological examination (general) (routine) without abnormal findings: Secondary | ICD-10-CM

## 2013-08-22 MED ORDER — LEVOFLOXACIN 500 MG PO TABS: 500.0000 mg | ORAL_TABLET | Freq: Every day | ORAL | Status: DC

## 2013-08-22 MED ORDER — FLUCONAZOLE 150 MG PO TABS: ORAL_TABLET | ORAL | Status: DC

## 2013-08-22 NOTE — Telephone Encounter (Signed)
Patient is waiting at [facility] because she thought [name] was going to fax over Rx for shingles shot. She said she needs this faxed over so they will give her the shot.  [facility]

## 2013-08-22 NOTE — Progress Notes (Signed)
   Subjective:    Patient ID: [name], female    DOB: [birth year], 62 y.o.   MRN: [id number]  HPI Presents for wellness checkup. Just completed a course of antibiotics (Zpack) from her cardiologist. Told she had pneumonia but this was not confirmed on xray. No wheezing. Continues to produce green sputum. Head congestion with facial pressure. No fever. Regular vision exams; recently had cataract surgery. Regular dental exams. Sees Dr. Fransico Him for diabetes care. Had recent labs, unavailable during office visit. Has a yeast infection since taking antibiotics. Patient called her GI specialist; her colonoscopy is UTD. Has had flu vaccine.    Review of Systems  Constitutional: Negative for fever, activity change, appetite change and fatigue.  HENT: Positive for congestion, postnasal drip, rhinorrhea and sinus pressure. Negative for dental problem, ear pain and sore throat.   Respiratory: Positive for cough. Negative for chest tightness, shortness of breath and wheezing.   Cardiovascular: Negative for chest pain and leg swelling.  Gastrointestinal: Negative for nausea, vomiting, abdominal pain, diarrhea, constipation and blood in stool.  Genitourinary: Negative for dysuria, urgency, frequency, vaginal discharge, enuresis, difficulty urinating and pelvic pain.  Itching and burning in the vaginal area.     Objective:   Physical Exam  Vitals reviewed. Constitutional: She is oriented to person, place, and time. She appears well-developed. No distress.  HENT:  Right Ear: External ear normal.  Left Ear: External ear normal.  Mouth/Throat: Oropharynx is clear and moist.  Neck: Normal range of motion. Neck supple. No tracheal deviation present. No thyromegaly present.  Cardiovascular: Normal rate, regular rhythm and normal heart sounds.  Exam reveals no gallop.   No murmur heard. Pulmonary/Chest: Effort normal and breath sounds normal.  Abdominal: Soft. She exhibits no distension. There is  tenderness.  Genitourinary: Vaginal discharge found.  Musculoskeletal: She exhibits no edema.  Lymphadenopathy:    She has cervical adenopathy.  Neurological: She is alert and oriented to person, place, and time.  Skin: Skin is warm and dry. No rash noted.  Psychiatric: She has a normal mood and affect. Her behavior is normal.  External GU: no lesions; white discharge with irritation noted. Bimanual exam normal but very limited due to extreme abd girth. Rectal exam normal; no stool for hemoccult. Neck supple with mild soft tender anterior cervical adenopathy.      Assessment & Plan:  Well woman exam - Plan: MM Digital Screening, POC Hemoccult Bld/Stl (3-Cd Home Screen)  Acute upper respiratory infections of unspecified site  Meds ordered this encounter  Medications  . levofloxacin (LEVAQUIN) 500 MG tablet    Sig: Take 1 tablet (500 mg total) by mouth daily.    Dispense:  10 tablet    Refill:  0    Order Specific Question:  Supervising Provider    Answer:  Merlyn Albert [[id number]]  . fluconazole (DIFLUCAN) 150 MG tablet    Sig: One po qd prn yeast infection; may repeat in 3-4 days if needed    Dispense:  2 tablet    Refill:  2    Order Specific Question:  Supervising Provider    Answer:  Merlyn Albert [[id number]]  do not take with Levaquin. Take daily vitamin D and calcium. Encouraged healthy diet, activity as tolerated and weight loss. Call back in 4-5 days if no improvement in URI symptoms, sooner if worse. Next physical in one year. Pneumovax at next visit.

## 2013-08-22 NOTE — Telephone Encounter (Signed)
[facility]. Pt notified.

## 2013-08-28 ENCOUNTER — Ambulatory Visit (HOSPITAL_COMMUNITY): Payer: BC Managed Care – PPO

## 2013-09-07 ENCOUNTER — Other Ambulatory Visit: Payer: Self-pay | Admitting: Family Medicine

## 2013-11-20 ENCOUNTER — Other Ambulatory Visit: Payer: Self-pay | Admitting: Nurse Practitioner

## 2013-11-20 ENCOUNTER — Ambulatory Visit (HOSPITAL_COMMUNITY)
Admission: RE | Admit: 2013-11-20 | Discharge: 2013-11-20 | Disposition: A | Discharge status: Home / Self Care | Payer: Medicare Other | Source: Ambulatory Visit | Attending: Nurse Practitioner | Admitting: Nurse Practitioner

## 2013-11-20 ENCOUNTER — Ambulatory Visit (HOSPITAL_COMMUNITY): Admission: RE | Admit: 2013-11-20 | Payer: Medicare Other | Source: Ambulatory Visit

## 2013-11-20 DIAGNOSIS — Z1231 Encounter for screening mammogram for malignant neoplasm of breast: Secondary | ICD-10-CM

## 2013-11-21 ENCOUNTER — Ambulatory Visit: Payer: Medicare Other | Admitting: Nurse Practitioner

## 2013-11-30 ENCOUNTER — Encounter: Payer: Self-pay | Admitting: Nurse Practitioner

## 2013-11-30 ENCOUNTER — Ambulatory Visit (INDEPENDENT_AMBULATORY_CARE_PROVIDER_SITE_OTHER): Payer: Medicare Other | Admitting: Nurse Practitioner

## 2013-11-30 VITALS — BP 132/86 | Ht 63.0 in | Wt 287.0 lb

## 2013-11-30 DIAGNOSIS — IMO0002 Reserved for concepts with insufficient information to code with codable children: Secondary | ICD-10-CM

## 2013-11-30 DIAGNOSIS — E1165 Type 2 diabetes mellitus with hyperglycemia: Secondary | ICD-10-CM

## 2013-11-30 DIAGNOSIS — E039 Hypothyroidism, unspecified: Secondary | ICD-10-CM

## 2013-11-30 DIAGNOSIS — M545 Low back pain, unspecified: Secondary | ICD-10-CM

## 2013-11-30 DIAGNOSIS — J069 Acute upper respiratory infection, unspecified: Secondary | ICD-10-CM

## 2013-11-30 DIAGNOSIS — I10 Essential (primary) hypertension: Secondary | ICD-10-CM

## 2013-11-30 DIAGNOSIS — E118 Type 2 diabetes mellitus with unspecified complications: Secondary | ICD-10-CM

## 2013-11-30 DIAGNOSIS — G8929 Other chronic pain: Secondary | ICD-10-CM

## 2013-11-30 MED ORDER — AMOXICILLIN-POT CLAVULANATE 875-125 MG PO TABS: 1.0000 | ORAL_TABLET | Freq: Two times a day (BID) | ORAL | Status: DC

## 2013-11-30 MED ORDER — ALPRAZOLAM 0.5 MG PO TABS: 0.5000 mg | ORAL_TABLET | Freq: Every evening | ORAL | Status: DC | PRN

## 2013-11-30 MED ORDER — TIZANIDINE HCL 4 MG PO TABS: 4.0000 mg | ORAL_TABLET | Freq: Three times a day (TID) | ORAL | Status: DC

## 2013-11-30 MED ORDER — OXYCODONE HCL 5 MG PO TABS: 5.0000 mg | ORAL_TABLET | ORAL | Status: DC | PRN

## 2013-11-30 NOTE — Patient Instructions (Signed)
[email or web address] 

## 2013-12-04 ENCOUNTER — Encounter: Payer: Self-pay | Admitting: Nurse Practitioner

## 2013-12-04 DIAGNOSIS — M545 Low back pain, unspecified: Secondary | ICD-10-CM | POA: Insufficient documentation

## 2013-12-04 DIAGNOSIS — G8929 Other chronic pain: Secondary | ICD-10-CM | POA: Insufficient documentation

## 2013-12-04 NOTE — Progress Notes (Signed)
Subjective:  Presents for followup. Had a recent intestinal virus, this has resolved. Taking fluids well. No urinary symptoms. No fever. Requesting a prescription for electronic blood pressure monitor. Before her illness her FBS less than 150. During illness 200-250. Over the past week has developed coughing reducing green sputum. Sore throat mainly in the morning. No ear pain. No headache. No wheezing. Has not used her albuterol. See Dr.Nida for her diabetes including her foot exam and lab work on a regular basis. Gets yearly eye exams. Also regular followup with her cardiologist. History of chronic low back pain which is stable on her current regimen.  Objective:   BP 132/86  Ht 5\' 3"  (1.6 m)  Wt 287 lb (130.182 kg)  BMI 50.85 kg/m2 NAD. Alert, oriented. TMs clear effusion, no erythema. Pharynx injected with PND noted. Neck supple with mild soft anterior adenopathy. Lungs clear. Heart regular rate rhythm. Lower extremities trace pitting edema.  Assessment: Problem List Items Addressed This Visit     Cardiovascular and Mediastinum   Hypertension - Primary     Endocrine   Type II or unspecified type diabetes mellitus with unspecified complication, uncontrolled   Hypothyroidism     Other   Morbid obesity    Other Visit Diagnoses   Acute upper respiratory infections of unspecified site        Chronic low back pain        Relevant Medications       tiZANidine (ZANAFLEX) tablet       oxyCODONE (Oxy IR/ROXICODONE) immediate release tablet       Plan: Meds ordered this encounter  Medications  . tiZANidine (ZANAFLEX) 4 MG tablet    Sig: Take 1 tablet (4 mg total) by mouth 3 (three) times daily. Prn muscle spasms    Dispense:  30 tablet    Refill:  0    Order Specific Question:  Supervising Provider    Answer:  Merlyn Albert [[id number]]  . ALPRAZolam (XANAX) 0.5 MG tablet    Sig: Take 1 tablet (0.5 mg total) by mouth at bedtime as needed. For sleep    Dispense:  30 tablet    Refill:   5    Order Specific Question:  Supervising Provider    Answer:  Merlyn Albert [[id number]]  . oxyCODONE (OXY IR/ROXICODONE) 5 MG immediate release tablet    Sig: Take 1 tablet (5 mg total) by mouth as needed.    Dispense:  30 tablet    Refill:  0    Order Specific Question:  Supervising Provider    Answer:  Merlyn Albert [[id number]]  . amoxicillin-clavulanate (AUGMENTIN) 875-125 MG per tablet    Sig: Take 1 tablet by mouth 2 (two) times daily.    Dispense:  20 tablet    Refill:  0    Order Specific Question:  Supervising Provider    Answer:  Merlyn Albert [[id number]]   OTC meds as directed for congestion. Continue followup with endocrinologist and cardiologist as planned. Recheck in 3 months, call back sooner if any problems.

## 2013-12-10 ENCOUNTER — Observation Stay (HOSPITAL_COMMUNITY): Payer: Medicare Other

## 2013-12-10 ENCOUNTER — Emergency Department (HOSPITAL_COMMUNITY): Payer: Medicare Other

## 2013-12-10 ENCOUNTER — Encounter (HOSPITAL_COMMUNITY): Payer: Self-pay | Admitting: Emergency Medicine

## 2013-12-10 ENCOUNTER — Inpatient Hospital Stay (HOSPITAL_COMMUNITY)
Admission: EM | Admit: 2013-12-10 | Discharge: 2013-12-14 | DRG: 247 | Disposition: A | Discharge status: Home / Self Care | Payer: Medicare Other | Attending: Cardiovascular Disease | Admitting: Cardiovascular Disease

## 2013-12-10 DIAGNOSIS — G8929 Other chronic pain: Secondary | ICD-10-CM | POA: Diagnosis present

## 2013-12-10 DIAGNOSIS — E039 Hypothyroidism, unspecified: Secondary | ICD-10-CM | POA: Diagnosis present

## 2013-12-10 DIAGNOSIS — M545 Low back pain, unspecified: Secondary | ICD-10-CM | POA: Diagnosis present

## 2013-12-10 DIAGNOSIS — IMO0001 Reserved for inherently not codable concepts without codable children: Secondary | ICD-10-CM | POA: Diagnosis present

## 2013-12-10 DIAGNOSIS — K589 Irritable bowel syndrome without diarrhea: Secondary | ICD-10-CM | POA: Diagnosis present

## 2013-12-10 DIAGNOSIS — F329 Major depressive disorder, single episode, unspecified: Secondary | ICD-10-CM | POA: Diagnosis present

## 2013-12-10 DIAGNOSIS — Z794 Long term (current) use of insulin: Secondary | ICD-10-CM

## 2013-12-10 DIAGNOSIS — R739 Hyperglycemia, unspecified: Secondary | ICD-10-CM | POA: Diagnosis present

## 2013-12-10 DIAGNOSIS — I2582 Chronic total occlusion of coronary artery: Secondary | ICD-10-CM | POA: Diagnosis present

## 2013-12-10 DIAGNOSIS — F411 Generalized anxiety disorder: Secondary | ICD-10-CM | POA: Diagnosis present

## 2013-12-10 DIAGNOSIS — J441 Chronic obstructive pulmonary disease with (acute) exacerbation: Secondary | ICD-10-CM | POA: Diagnosis present

## 2013-12-10 DIAGNOSIS — Z7982 Long term (current) use of aspirin: Secondary | ICD-10-CM

## 2013-12-10 DIAGNOSIS — I214 Non-ST elevation (NSTEMI) myocardial infarction: Principal | ICD-10-CM | POA: Diagnosis present

## 2013-12-10 DIAGNOSIS — I7090 Unspecified atherosclerosis: Secondary | ICD-10-CM

## 2013-12-10 DIAGNOSIS — I200 Unstable angina: Secondary | ICD-10-CM | POA: Diagnosis present

## 2013-12-10 DIAGNOSIS — F3289 Other specified depressive episodes: Secondary | ICD-10-CM | POA: Diagnosis present

## 2013-12-10 DIAGNOSIS — E785 Hyperlipidemia, unspecified: Secondary | ICD-10-CM | POA: Diagnosis present

## 2013-12-10 DIAGNOSIS — Z8249 Family history of ischemic heart disease and other diseases of the circulatory system: Secondary | ICD-10-CM

## 2013-12-10 DIAGNOSIS — Z955 Presence of coronary angioplasty implant and graft: Secondary | ICD-10-CM

## 2013-12-10 DIAGNOSIS — E1165 Type 2 diabetes mellitus with hyperglycemia: Secondary | ICD-10-CM | POA: Diagnosis present

## 2013-12-10 DIAGNOSIS — I2510 Atherosclerotic heart disease of native coronary artery without angina pectoris: Secondary | ICD-10-CM | POA: Diagnosis present

## 2013-12-10 DIAGNOSIS — Z6841 Body Mass Index (BMI) 40.0 and over, adult: Secondary | ICD-10-CM

## 2013-12-10 DIAGNOSIS — IMO0002 Reserved for concepts with insufficient information to code with codable children: Secondary | ICD-10-CM | POA: Diagnosis present

## 2013-12-10 DIAGNOSIS — I25810 Atherosclerosis of coronary artery bypass graft(s) without angina pectoris: Secondary | ICD-10-CM | POA: Diagnosis present

## 2013-12-10 DIAGNOSIS — D649 Anemia, unspecified: Secondary | ICD-10-CM | POA: Diagnosis not present

## 2013-12-10 DIAGNOSIS — Z9861 Coronary angioplasty status: Secondary | ICD-10-CM

## 2013-12-10 DIAGNOSIS — K219 Gastro-esophageal reflux disease without esophagitis: Secondary | ICD-10-CM | POA: Diagnosis present

## 2013-12-10 DIAGNOSIS — R079 Chest pain, unspecified: Secondary | ICD-10-CM

## 2013-12-10 DIAGNOSIS — E118 Type 2 diabetes mellitus with unspecified complications: Secondary | ICD-10-CM

## 2013-12-10 DIAGNOSIS — I10 Essential (primary) hypertension: Secondary | ICD-10-CM | POA: Diagnosis present

## 2013-12-10 DIAGNOSIS — Z79899 Other long term (current) drug therapy: Secondary | ICD-10-CM

## 2013-12-10 DIAGNOSIS — J449 Chronic obstructive pulmonary disease, unspecified: Secondary | ICD-10-CM

## 2013-12-10 DIAGNOSIS — I2789 Other specified pulmonary heart diseases: Secondary | ICD-10-CM | POA: Diagnosis present

## 2013-12-10 LAB — LIPID PANEL
CHOL/HDL RATIO: 8.4 ratio
Cholesterol: 320 mg/dL — ABNORMAL HIGH (ref 0–200)
HDL: 38 mg/dL — AB (ref >39)
LDL CALC: 236 mg/dL — AB (ref 0–99)
Triglycerides: 231 mg/dL — ABNORMAL HIGH (ref <150)
VLDL: 46 mg/dL — AB (ref 0–40)

## 2013-12-10 LAB — TROPONIN I
TROPONIN I: 0.83 ng/mL — AB (ref <0.30)
TROPONIN I: 5.89 ng/mL — AB (ref <0.30)
Troponin I: <0.3 ng/mL (ref <0.30)

## 2013-12-10 LAB — D-DIMER, QUANTITATIVE (NOT AT ARMC): D DIMER QUANT: 0.66 ug{FEU}/mL — AB (ref 0.00–0.48)

## 2013-12-10 LAB — BASIC METABOLIC PANEL
BUN: 15 mg/dL (ref 6–23)
BUN: 15 mg/dL (ref 6–23)
CALCIUM: 9.8 mg/dL (ref 8.4–10.5)
CALCIUM: 9.8 mg/dL (ref 8.4–10.5)
CO2: 28 meq/L (ref 19–32)
CO2: 24 meq/L (ref 19–32)
Chloride: 94 mEq/L — ABNORMAL LOW (ref 96–112)
Chloride: 92 mEq/L — ABNORMAL LOW (ref 96–112)
Creatinine, Ser: 0.64 mg/dL (ref 0.50–1.10)
Creatinine, Ser: 0.62 mg/dL (ref 0.50–1.10)
GFR calc Af Amer: >90 mL/min (ref >90)
GFR calc Af Amer: >90 mL/min (ref >90)
GFR calc non Af Amer: >90 mL/min (ref >90)
GLUCOSE: 410 mg/dL — AB (ref 70–99)
GLUCOSE: 337 mg/dL — AB (ref 70–99)
Potassium: 4.3 mEq/L (ref 3.7–5.3)
Potassium: 4.1 mEq/L (ref 3.7–5.3)
SODIUM: 137 meq/L (ref 137–147)
Sodium: 136 mEq/L — ABNORMAL LOW (ref 137–147)

## 2013-12-10 LAB — HEPARIN LEVEL (UNFRACTIONATED): Heparin Unfractionated: 0.25 IU/mL — ABNORMAL LOW (ref 0.30–0.70)

## 2013-12-10 LAB — CBC WITH DIFFERENTIAL/PLATELET
Basophils Absolute: 0 10*3/uL (ref 0.0–0.1)
Basophils Relative: 0 % (ref 0–1)
EOS PCT: 3 % (ref 0–5)
Eosinophils Absolute: 0.2 10*3/uL (ref 0.0–0.7)
HEMATOCRIT: 43.7 % (ref 36.0–46.0)
HEMOGLOBIN: 14.5 g/dL (ref 12.0–15.0)
LYMPHS ABS: 1.1 10*3/uL (ref 0.7–4.0)
LYMPHS PCT: 16 % (ref 12–46)
MCH: 30.7 pg (ref 26.0–34.0)
MCHC: 33.2 g/dL (ref 30.0–36.0)
MCV: 92.6 fL (ref 78.0–100.0)
Monocytes Absolute: 0.6 10*3/uL (ref 0.1–1.0)
Monocytes Relative: 10 % (ref 3–12)
NEUTROS ABS: 4.8 10*3/uL (ref 1.7–7.7)
Neutrophils Relative %: 71 % (ref 43–77)
PLATELETS: 218 10*3/uL (ref 150–400)
RBC: 4.72 MIL/uL (ref 3.87–5.11)
RDW: 13.1 % (ref 11.5–15.5)
WBC: 6.7 10*3/uL (ref 4.0–10.5)

## 2013-12-10 LAB — GLUCOSE, CAPILLARY
GLUCOSE-CAPILLARY: 228 mg/dL — AB (ref 70–99)
Glucose-Capillary: 356 mg/dL — ABNORMAL HIGH (ref 70–99)
Glucose-Capillary: 238 mg/dL — ABNORMAL HIGH (ref 70–99)

## 2013-12-10 LAB — HEMOGLOBIN A1C
Hgb A1c MFr Bld: 10 % — ABNORMAL HIGH (ref <5.7)
Mean Plasma Glucose: 240 mg/dL — ABNORMAL HIGH (ref <117)

## 2013-12-10 LAB — TSH: TSH: 4.781 u[IU]/mL — AB (ref 0.350–4.500)

## 2013-12-10 MED ORDER — PANTOPRAZOLE SODIUM 40 MG IV SOLR
40.0000 mg | Freq: Two times a day (BID) | INTRAVENOUS | Status: DC
  Administered 2013-12-10 – 2013-12-13 (×7): 40 mg via INTRAVENOUS
  Filled 2013-12-10 (×9): qty 40

## 2013-12-10 MED ORDER — ALBUTEROL SULFATE (2.5 MG/3ML) 0.083% IN NEBU: 2.5000 mg | INHALATION_SOLUTION | Freq: Four times a day (QID) | RESPIRATORY_TRACT | Status: DC

## 2013-12-10 MED ORDER — INSULIN GLARGINE 100 UNIT/ML ~~LOC~~ SOLN
70.0000 [IU] | Freq: Every day | SUBCUTANEOUS | Status: DC
  Administered 2013-12-10: 50 [IU] via SUBCUTANEOUS
  Filled 2013-12-10: qty 0.7

## 2013-12-10 MED ORDER — ENOXAPARIN SODIUM 60 MG/0.6ML ~~LOC~~ SOLN
60.0000 mg | SUBCUTANEOUS | Status: DC
  Administered 2013-12-10: 60 mg via SUBCUTANEOUS
  Filled 2013-12-10: qty 0.6

## 2013-12-10 MED ORDER — METOPROLOL SUCCINATE ER 25 MG PO TB24
25.0000 mg | ORAL_TABLET | Freq: Every day | ORAL | Status: DC
  Administered 2013-12-10 – 2013-12-14 (×5): 25 mg via ORAL
  Filled 2013-12-10 (×5): qty 1

## 2013-12-10 MED ORDER — IPRATROPIUM BROMIDE 0.02 % IN SOLN
0.5000 mg | Freq: Four times a day (QID) | RESPIRATORY_TRACT | Status: DC
Start: 2013-12-10 — End: 2013-12-10

## 2013-12-10 MED ORDER — ACETAMINOPHEN 325 MG PO TABS: 650.0000 mg | ORAL_TABLET | Freq: Four times a day (QID) | ORAL | Status: DC | PRN

## 2013-12-10 MED ORDER — GUAIFENESIN-DM 100-10 MG/5ML PO SYRP
5.0000 mL | ORAL_SOLUTION | ORAL | Status: DC | PRN
  Administered 2013-12-11 – 2013-12-12 (×2): 5 mL via ORAL
  Filled 2013-12-10 (×2): qty 5

## 2013-12-10 MED ORDER — MORPHINE SULFATE 2 MG/ML IJ SOLN: 2.0000 mg | INTRAMUSCULAR | Status: DC | PRN

## 2013-12-10 MED ORDER — HEPARIN (PORCINE) IN NACL 100-0.45 UNIT/ML-% IJ SOLN
1600.0000 [IU]/h | INTRAMUSCULAR | Status: DC
  Administered 2013-12-10 (×2): 1000 [IU]/h via INTRAVENOUS
  Administered 2013-12-11: 19:00:00 1350 [IU]/h via INTRAVENOUS
  Filled 2013-12-10 (×4): qty 250

## 2013-12-10 MED ORDER — ONDANSETRON HCL 4 MG PO TABS: 4.0000 mg | ORAL_TABLET | Freq: Four times a day (QID) | ORAL | Status: DC | PRN

## 2013-12-10 MED ORDER — TIZANIDINE HCL 4 MG PO TABS
4.0000 mg | ORAL_TABLET | Freq: Three times a day (TID) | ORAL | Status: DC
  Administered 2013-12-12 – 2013-12-13 (×5): 4 mg via ORAL
  Filled 2013-12-10 (×15): qty 1

## 2013-12-10 MED ORDER — INSULIN ASPART 100 UNIT/ML ~~LOC~~ SOLN
0.0000 [IU] | Freq: Three times a day (TID) | SUBCUTANEOUS | Status: DC
  Administered 2013-12-10: 20 [IU] via SUBCUTANEOUS
  Administered 2013-12-10: 7 [IU] via SUBCUTANEOUS
  Administered 2013-12-11: 3 [IU] via SUBCUTANEOUS
  Administered 2013-12-11: 4 [IU] via SUBCUTANEOUS
  Administered 2013-12-11 – 2013-12-12 (×2): 3 [IU] via SUBCUTANEOUS
  Administered 2013-12-12: 14:00:00 4 [IU] via SUBCUTANEOUS
  Administered 2013-12-13: 3 [IU] via SUBCUTANEOUS
  Administered 2013-12-13: 15:00:00 11 [IU] via SUBCUTANEOUS
  Administered 2013-12-14: 4 [IU] via SUBCUTANEOUS

## 2013-12-10 MED ORDER — NITROGLYCERIN 2 % TD OINT
1.0000 [in_us] | TOPICAL_OINTMENT | Freq: Once | TRANSDERMAL | Status: AC
  Administered 2013-12-10: 1 [in_us] via TOPICAL
  Filled 2013-12-10: qty 1

## 2013-12-10 MED ORDER — ATORVASTATIN CALCIUM 10 MG PO TABS
10.0000 mg | ORAL_TABLET | Freq: Every day | ORAL | Status: DC
  Administered 2013-12-10: 10 mg via ORAL
  Filled 2013-12-10: qty 1

## 2013-12-10 MED ORDER — NITROGLYCERIN 0.4 MG SL SUBL
0.4000 mg | SUBLINGUAL_TABLET | SUBLINGUAL | Status: DC | PRN
  Administered 2013-12-10 (×3): 0.4 mg via SUBLINGUAL

## 2013-12-10 MED ORDER — FUROSEMIDE 10 MG/ML IJ SOLN
40.0000 mg | Freq: Once | INTRAMUSCULAR | Status: AC
  Administered 2013-12-10: 40 mg via INTRAVENOUS
  Filled 2013-12-10: qty 4

## 2013-12-10 MED ORDER — NITROGLYCERIN 0.4 MG SL SUBL
SUBLINGUAL_TABLET | SUBLINGUAL | Status: AC
  Filled 2013-12-10: qty 1

## 2013-12-10 MED ORDER — ONDANSETRON HCL 4 MG/2ML IJ SOLN: 4.0000 mg | Freq: Four times a day (QID) | INTRAMUSCULAR | Status: DC | PRN

## 2013-12-10 MED ORDER — ACETAMINOPHEN 650 MG RE SUPP: 650.0000 mg | Freq: Four times a day (QID) | RECTAL | Status: DC | PRN

## 2013-12-10 MED ORDER — HYDROCODONE-ACETAMINOPHEN 5-325 MG PO TABS
1.0000 | ORAL_TABLET | ORAL | Status: DC | PRN
  Administered 2013-12-13: 11:00:00 2 via ORAL
  Filled 2013-12-10: qty 2

## 2013-12-10 MED ORDER — ASPIRIN EC 325 MG PO TBEC
325.0000 mg | DELAYED_RELEASE_TABLET | Freq: Every day | ORAL | Status: DC
  Filled 2013-12-10: qty 1

## 2013-12-10 MED ORDER — IPRATROPIUM-ALBUTEROL 0.5-2.5 (3) MG/3ML IN SOLN
3.0000 mL | Freq: Four times a day (QID) | RESPIRATORY_TRACT | Status: DC
  Administered 2013-12-10 – 2013-12-11 (×5): 3 mL via RESPIRATORY_TRACT
  Filled 2013-12-10 (×5): qty 3

## 2013-12-10 MED ORDER — LEVOTHYROXINE SODIUM 112 MCG PO TABS
112.0000 ug | ORAL_TABLET | Freq: Every day | ORAL | Status: DC
  Administered 2013-12-11: 112 ug via ORAL
  Filled 2013-12-10 (×2): qty 1

## 2013-12-10 MED ORDER — ASPIRIN 325 MG PO TABS
325.0000 mg | ORAL_TABLET | Freq: Every day | ORAL | Status: DC
  Administered 2013-12-11: 325 mg via ORAL
  Filled 2013-12-10: qty 1

## 2013-12-10 MED ORDER — PANTOPRAZOLE SODIUM 40 MG PO TBEC
40.0000 mg | DELAYED_RELEASE_TABLET | Freq: Two times a day (BID) | ORAL | Status: DC
  Administered 2013-12-10: 40 mg via ORAL
  Filled 2013-12-10: qty 1

## 2013-12-10 MED ORDER — CANAGLIFLOZIN 100 MG PO TABS
100.0000 mg | ORAL_TABLET | Freq: Every day | ORAL | Status: DC
  Administered 2013-12-10 – 2013-12-14 (×4): 100 mg via ORAL
  Filled 2013-12-10 (×5): qty 1

## 2013-12-10 MED ORDER — KETOROLAC TROMETHAMINE 60 MG/2ML IM SOLN
60.0000 mg | Freq: Once | INTRAMUSCULAR | Status: AC
  Administered 2013-12-10: 60 mg via INTRAMUSCULAR
  Filled 2013-12-10: qty 2

## 2013-12-10 MED ORDER — MORPHINE SULFATE 4 MG/ML IJ SOLN
4.0000 mg | Freq: Once | INTRAMUSCULAR | Status: AC
  Administered 2013-12-10: 4 mg via INTRAVENOUS
  Filled 2013-12-10 (×2): qty 1

## 2013-12-10 MED ORDER — HEPARIN BOLUS VIA INFUSION
2000.0000 [IU] | Freq: Once | INTRAVENOUS | Status: AC
  Administered 2013-12-10: 2000 [IU] via INTRAVENOUS
  Filled 2013-12-10: qty 2000

## 2013-12-10 MED ORDER — LEVOFLOXACIN IN D5W 750 MG/150ML IV SOLN
750.0000 mg | INTRAVENOUS | Status: DC
  Administered 2013-12-10 – 2013-12-13 (×4): 750 mg via INTRAVENOUS
  Filled 2013-12-10 (×6): qty 150

## 2013-12-10 MED ORDER — ALPRAZOLAM 0.5 MG PO TABS
0.5000 mg | ORAL_TABLET | Freq: Every evening | ORAL | Status: DC | PRN
  Administered 2013-12-11: 0.5 mg via ORAL
  Filled 2013-12-10: qty 1

## 2013-12-10 MED ORDER — ALUM & MAG HYDROXIDE-SIMETH 200-200-20 MG/5ML PO SUSP: 30.0000 mL | Freq: Four times a day (QID) | ORAL | Status: DC | PRN

## 2013-12-10 MED ORDER — ENOXAPARIN SODIUM 60 MG/0.6ML ~~LOC~~ SOLN: 60.0000 mg | SUBCUTANEOUS | Status: DC

## 2013-12-10 MED ORDER — INSULIN ASPART 100 UNIT/ML ~~LOC~~ SOLN: 0.0000 [IU] | Freq: Every day | SUBCUTANEOUS | Status: DC

## 2013-12-10 MED ORDER — SODIUM CHLORIDE 0.9 % IV SOLN
INTRAVENOUS | Status: DC
  Administered 2013-12-10: 14:00:00 via INTRAVENOUS

## 2013-12-10 MED ORDER — ALBUTEROL SULFATE (2.5 MG/3ML) 0.083% IN NEBU
5.0000 mg | INHALATION_SOLUTION | Freq: Once | RESPIRATORY_TRACT | Status: AC
  Administered 2013-12-10: 5 mg via RESPIRATORY_TRACT
  Filled 2013-12-10: qty 6

## 2013-12-10 NOTE — Progress Notes (Signed)
Nutrition Brief Note  Patient identified on the Malnutrition Screening Tool (MST) Report  Wt Readings from Last 15 Encounters:  12/10/13 283 lb 6.4 oz (128.549 kg)  11/30/13 287 lb (130.182 kg)  08/22/13 275 lb 3.2 oz (124.83 kg)  08/17/13 255 lb 4 oz (115.781 kg)  06/19/13 272 lb (123.378 kg)  03/29/13 263 lb (119.296 kg)  05/08/12 255 lb (115.667 kg)  12/27/11 288 lb (130.636 kg)  11/25/11 277 lb 14.4 oz (126.055 kg)  10/28/11 277 lb (125.646 kg)  10/19/11 283 lb 8.2 oz (128.6 kg)  10/19/11 283 lb 8.2 oz (128.6 kg)  10/15/11 275 lb (124.739 kg)  10/15/11 275 lb (124.739 kg)  10/12/11 275 lb (124.739 kg)    Body mass index is 48.62 kg/(m^2). Patient meets criteria for extreme obesity, class III based on current BMI.   Current diet order is Heart Healthy/ Carb Modified, patient is consuming approximately n/a% of meals at this time. Labs and medications reviewed.   No nutrition interventions warranted at this time. If nutrition issues arise, please consult RD.   Oshea Percival A. Mayford Knife, RD, LDN Pager: [phone]
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
## 2013-12-10 NOTE — ED Notes (Signed)
Clydie Braun NP in room when re assessed pt.

## 2013-12-10 NOTE — ED Notes (Addendum)
Per EMS, pt. Started having sharp chest pain 2 hours ago pain 10/10. Pt took asprin 324 prior to ems arrival. EMS gave 2 nitro with some relief. Pt. Reports being on antibiotics for upper respiratory infection.
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
## 2013-12-10 NOTE — H&P (Signed)
Patient seen and examined.  Above note reviewed.  She has been admitted for chest pain shortness of breath. Cough and shortness of breath have been present for approximately 2 weeks. She has received an outpatient course of antibiotics for upper respiratory tract infection without significant improvement. The patient continued to cough and be short of breath. This morning, she awoke with substernal chest pain. She reports it as a sharp pain, nonradiating, occasionally worse with movement, worse with deep inspiration and cough. She reports that nitroglycerin did help her pain. The patient has a history of coronary artery disease status post CABG and PCI in the past. She reports that her last cardiac catheterization was approximately 2 years ago. She's currently on aspirin. EKG didn't emergency room did not show any acute changes. Initial troponin was found to be negative. Repeat troponin at 2:30 PM is mildly elevated at 0.83. With the patient's cardiac history, there is certainly concern for angina. Cardiology consultation has been requested. Patient will be continued on nitro paste, aspirin and started on heparin for anticoagulation. With acute onset of her chest pain, pleuritic nature elevated troponin, we will obtain a CT Angio study of the chest to rule out underlying pulmonary embolus/aortic dissection. Continue antibiotics and bronchodilators for presumed underlying bronchitis. She does not have any wheezing at this time therefore steroids will not be started.  [name]
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
## 2013-12-11 ENCOUNTER — Ambulatory Visit (HOSPITAL_COMMUNITY): Admit: 2013-12-11 | Payer: Self-pay | Admitting: Cardiovascular Disease

## 2013-12-11 DIAGNOSIS — J449 Chronic obstructive pulmonary disease, unspecified: Secondary | ICD-10-CM

## 2013-12-11 DIAGNOSIS — I059 Rheumatic mitral valve disease, unspecified: Secondary | ICD-10-CM

## 2013-12-11 DIAGNOSIS — E039 Hypothyroidism, unspecified: Secondary | ICD-10-CM

## 2013-12-11 LAB — CBC
HEMATOCRIT: 39.5 % (ref 36.0–46.0)
Hemoglobin: 13.1 g/dL (ref 12.0–15.0)
MCH: 30.5 pg (ref 26.0–34.0)
MCHC: 33.2 g/dL (ref 30.0–36.0)
MCV: 91.9 fL (ref 78.0–100.0)
Platelets: 227 10*3/uL (ref 150–400)
RBC: 4.3 MIL/uL (ref 3.87–5.11)
RDW: 13.2 % (ref 11.5–15.5)
WBC: 6.1 10*3/uL (ref 4.0–10.5)

## 2013-12-11 LAB — GLUCOSE, CAPILLARY
GLUCOSE-CAPILLARY: 166 mg/dL — AB (ref 70–99)
GLUCOSE-CAPILLARY: 144 mg/dL — AB (ref 70–99)
GLUCOSE-CAPILLARY: 115 mg/dL — AB (ref 70–99)
Glucose-Capillary: 121 mg/dL — ABNORMAL HIGH (ref 70–99)

## 2013-12-11 LAB — HEPARIN LEVEL (UNFRACTIONATED)
HEPARIN UNFRACTIONATED: 0.26 [IU]/mL — AB (ref 0.30–0.70)
Heparin Unfractionated: 0.23 IU/mL — ABNORMAL LOW (ref 0.30–0.70)
Heparin Unfractionated: 0.23 IU/mL — ABNORMAL LOW (ref 0.30–0.70)

## 2013-12-11 LAB — PROTIME-INR
INR: 1.06 (ref 0.00–1.49)
Prothrombin Time: 13.6 seconds (ref 11.6–15.2)

## 2013-12-11 LAB — BASIC METABOLIC PANEL
BUN: 18 mg/dL (ref 6–23)
CALCIUM: 9.3 mg/dL (ref 8.4–10.5)
CO2: 26 mEq/L (ref 19–32)
Chloride: 95 mEq/L — ABNORMAL LOW (ref 96–112)
Creatinine, Ser: 0.73 mg/dL (ref 0.50–1.10)
GFR calc non Af Amer: >90 mL/min (ref >90)
Glucose, Bld: 200 mg/dL — ABNORMAL HIGH (ref 70–99)
Potassium: 3.2 mEq/L — ABNORMAL LOW (ref 3.7–5.3)
Sodium: 137 mEq/L (ref 137–147)

## 2013-12-11 MED ORDER — INSULIN GLARGINE 100 UNIT/ML ~~LOC~~ SOLN
52.0000 [IU] | Freq: Every day | SUBCUTANEOUS | Status: DC
  Administered 2013-12-11: 22:00:00 15 [IU] via SUBCUTANEOUS
  Filled 2013-12-11 (×3): qty 0.52

## 2013-12-11 MED ORDER — ALBUTEROL SULFATE (2.5 MG/3ML) 0.083% IN NEBU
2.5000 mg | INHALATION_SOLUTION | Freq: Four times a day (QID) | RESPIRATORY_TRACT | Status: DC | PRN
  Administered 2013-12-13: 12:00:00 2.5 mg via RESPIRATORY_TRACT
  Filled 2013-12-11: qty 3

## 2013-12-11 MED ORDER — HEPARIN BOLUS VIA INFUSION
1000.0000 [IU] | Freq: Once | INTRAVENOUS | Status: AC
  Administered 2013-12-11: 1000 [IU] via INTRAVENOUS
  Filled 2013-12-11: qty 1000

## 2013-12-11 MED ORDER — ASPIRIN 325 MG PO TABS: 325.0000 mg | ORAL_TABLET | Freq: Every day | ORAL | Status: DC

## 2013-12-11 MED ORDER — LEVOTHYROXINE SODIUM 125 MCG PO TABS
125.0000 ug | ORAL_TABLET | Freq: Every day | ORAL | Status: DC
  Administered 2013-12-12 – 2013-12-14 (×3): 125 ug via ORAL
  Filled 2013-12-11 (×4): qty 1

## 2013-12-11 MED ORDER — SODIUM CHLORIDE 0.9 % IV SOLN
1.0000 mL/kg/h | INTRAVENOUS | Status: DC
  Administered 2013-12-12: 1.003 mL/kg/h via INTRAVENOUS

## 2013-12-11 MED ORDER — ATORVASTATIN CALCIUM 80 MG PO TABS
80.0000 mg | ORAL_TABLET | Freq: Every day | ORAL | Status: DC
  Administered 2013-12-11 – 2013-12-13 (×3): 80 mg via ORAL
  Filled 2013-12-11 (×2): qty 1
  Filled 2013-12-11: qty 2
  Filled 2013-12-11 (×2): qty 1

## 2013-12-11 MED ORDER — LATANOPROST 0.005 % OP SOLN
1.0000 [drp] | Freq: Every day | OPHTHALMIC | Status: DC
  Administered 2013-12-11 – 2013-12-13 (×3): 1 [drp] via OPHTHALMIC
  Filled 2013-12-11 (×2): qty 2.5

## 2013-12-11 MED ORDER — SODIUM CHLORIDE 0.9 % IJ SOLN
3.0000 mL | Freq: Two times a day (BID) | INTRAMUSCULAR | Status: DC
  Administered 2013-12-11: 22:00:00 3 mL via INTRAVENOUS

## 2013-12-11 MED ORDER — SODIUM CHLORIDE 0.9 % IV SOLN: 250.0000 mL | INTRAVENOUS | Status: DC | PRN

## 2013-12-11 MED ORDER — INSULIN ASPART 100 UNIT/ML ~~LOC~~ SOLN
3.0000 [IU] | Freq: Three times a day (TID) | SUBCUTANEOUS | Status: DC
  Administered 2013-12-11 – 2013-12-14 (×6): 3 [IU] via SUBCUTANEOUS

## 2013-12-11 MED ORDER — ASPIRIN 81 MG PO CHEW
81.0000 mg | CHEWABLE_TABLET | ORAL | Status: AC
  Administered 2013-12-12: 06:00:00 81 mg via ORAL
  Filled 2013-12-11: qty 1

## 2013-12-11 MED ORDER — IPRATROPIUM-ALBUTEROL 0.5-2.5 (3) MG/3ML IN SOLN
3.0000 mL | Freq: Three times a day (TID) | RESPIRATORY_TRACT | Status: DC
  Administered 2013-12-12: 08:00:00 3 mL via RESPIRATORY_TRACT
  Filled 2013-12-11: qty 3

## 2013-12-11 MED ORDER — CLOPIDOGREL BISULFATE 75 MG PO TABS
75.0000 mg | ORAL_TABLET | Freq: Every day | ORAL | Status: DC
  Administered 2013-12-12 – 2013-12-14 (×3): 75 mg via ORAL
  Filled 2013-12-11 (×4): qty 1

## 2013-12-11 MED ORDER — SODIUM CHLORIDE 0.9 % IJ SOLN
3.0000 mL | INTRAMUSCULAR | Status: DC | PRN
  Administered 2013-12-11: 10 mL via INTRAVENOUS

## 2013-12-11 MED ORDER — POTASSIUM CHLORIDE CRYS ER 20 MEQ PO TBCR
40.0000 meq | EXTENDED_RELEASE_TABLET | Freq: Once | ORAL | Status: AC
  Administered 2013-12-11: 40 meq via ORAL
  Filled 2013-12-11: qty 2

## 2013-12-11 MED ORDER — CLOPIDOGREL BISULFATE 75 MG PO TABS
600.0000 mg | ORAL_TABLET | Freq: Once | ORAL | Status: AC
  Administered 2013-12-11: 600 mg via ORAL
  Filled 2013-12-11: qty 8

## 2013-12-11 MED ORDER — INSULIN GLARGINE 100 UNIT/ML ~~LOC~~ SOLN: 15.0000 [IU] | Freq: Once | SUBCUTANEOUS | Status: AC

## 2013-12-11 NOTE — Progress Notes (Signed)
UR chart review completed.  

## 2013-12-11 NOTE — Progress Notes (Signed)
*  PRELIMINARY RESULTS* Echocardiogram 2D Echocardiogram has been performed.  [name] 12/11/2013, 10:31 AM
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
## 2013-12-11 NOTE — Progress Notes (Signed)
Patient seen and examined. Above note reviewed.  She was admitted to the hospital with cough, shortness of breath and chest pain. Chest x-ray did not indicate pneumonia. Consult this may have bronchitis and she was started on nebulizer treatments with antibiotics. From a respiratory standpoint, the patient does feel significantly improved. Her cough and shortness of breath have resolved. She does not report any chest pain this morning.  The patient does have a significant cardiac history with CABG in 2003 and PCI in February 2013. Initial cardiac enzymes emergency room were negative. EKG did not show any acute changes. Followup cardiac markers were consistent with non-ST elevation MI with most recent troponin 5.89. Patient was seen by cardiology shortly after admission. She was started on heparin infusion, nitro paste, aspirin statin she is already on beta blocker. She will need further evaluation with cardiac catheterization and plans are to transfer to [facility]. Arrangements have been made by the cardiology service.  Roda Lauture

## 2013-12-11 NOTE — Progress Notes (Signed)
[facility] PROGRESS NOTE  [name] MGQ:[id number] DOB: [birth year] DOA: 12/10/2013 PCP: Harlow Asa, MD  Assessment/Plan: Principal Problem:  NSTEMI. Patient with history of CAD. Cardiac markers positive with highest 5.89. Repeat EKG NSR ST and T wave abnormality, inferior ischemia. Cards evaluating and recommending cath. heparin drip.  Will continue aspirin and beta blocker with parameters.  Active Problems:  Hyperglycemia: Serum glucose 410 on admission. Lantus 52units. A1c 10.0. Added meal coverage  Hypertension: Systolic blood pressure range 267-124. Will continue her home beta blocker. Will hold Lasix for now.   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 Ejection fraction 55-65% by diagnostic cath in February 2013 coronary angioplasty with stent placement and February 2013. On Plavix for over a year this was discontinued in December2014 per cardiology. Await echo results  Type II or unspecified type diabetes mellitus with unspecified complication, uncontrolled  Hypothyroidism  Hyperlipidemia  Morbid obesity: BMI 48.7 nutritional consult  Gastroesophageal reflux disease: Patient on PPI at home will increase this dose.   Code Status: full Family Communication: none present Disposition Plan: home when ready   Consultants:  cardiology  Procedures:  echo  Antibiotics: levaquin 12/10/13>> HPI/Subjective: Up in chair reporting no pain and feeling well  Objective: Filed Vitals:   12/11/13 0747  BP:   Pulse: 81  Temp:   Resp: 13    Intake/Output Summary (Last 24 hours) at 12/11/13 1019 Last data filed at 12/11/13 0458  Gross per 24 hour  Intake    480 ml  Output   2100 ml  Net  -1620 ml   Filed Weights   12/10/13 0641 12/10/13 1134 12/11/13 0455  Weight: 127.461 kg (281 lb) 128.549 kg (283 lb 6.4 oz) 132.632 kg (292 lb 6.4 oz)    Exam:   General:  Obese NAD  Cardiovascular: RRR no m/g/r  no LE edema  Respiratory: normal effort BS clear bilaterally no wheeze  Abdomen: obese soft +BS non-tender  Musculoskeletal: no clubbing or cyanosis   Data Reviewed: Basic Metabolic Panel:  Recent Labs Lab 12/10/13 0718 12/10/13 1430 12/11/13 0440  NA 137 136* 137  K 4.3 4.1 3.2*  CL 94* 92* 95*  CO2 28 24 26   GLUCOSE 410* 337* 200*  BUN 15 15 18   CREATININE 0.64 0.62 0.73  CALCIUM 9.8 9.8 9.3   Liver Function Tests: No results found for this basename: AST, ALT, ALKPHOS, BILITOT, PROT, ALBUMIN,  in the last 168 hours No results found for this basename: LIPASE, AMYLASE,  in the last 168 hours No results found for this basename: AMMONIA,  in the last 168 hours CBC:  Recent Labs Lab 12/10/13 0718 12/11/13 0440  WBC 6.7 6.1  NEUTROABS 4.8  --   HGB 14.5 13.1  HCT 43.7 39.5  MCV 92.6 91.9  PLT 218 227   Cardiac Enzymes:  Recent Labs Lab 12/10/13 0718 12/10/13 1430 12/10/13 2204  TROPONINI <0.30 0.83* 5.89*   BNP (last 3 results) No results found for this basename: PROBNP,  in the last 8760 hours CBG:  Recent Labs Lab 12/10/13 1209 12/10/13 1722 12/10/13 2117 12/11/13 0817  GLUCAP 356* 228* 238* 166*    No results found for this or any previous visit (from the past 240 hour(s)).   Studies: Dg Chest 2 View  12/10/2013   CLINICAL DATA:  Chest pain  EXAM: CHEST  2 VIEW  COMPARISON:  DG CHEST 2 VIEW dated 08/17/2013; CT ANGIO CHEST W/CM &/OR WO/CM  dated 10/11/2011  FINDINGS: Cardiomegaly is present although a component of the enlarged cardiopericardial silhouette is attributable to prominent epicardial adipose tissue.  Prior CABG and prior coronary artery stenting.  Stable lingular scarring.  Azygos fissure noted.  Indistinct pulmonary vasculature suggesting pulmonary venous hypertension. No overt edema.  Thoracic spondylosis noted. Large lung volumes raise the possibility of mild air trapping.  IMPRESSION: 1. Cardiomegaly with mild pulmonary venous  hypertension. 2. Large lung volumes raise a possibility of air trapping. 3. Thoracic spondylosis.   Electronically Signed   By: Herbie BaltimoreWalt  Liebkemann M.D.   On: 12/10/2013 08:31    Scheduled Meds: . aspirin  325 mg Oral Daily  . atorvastatin  10 mg Oral q1800  . Canagliflozin  100 mg Oral Daily  . insulin aspart  0-20 Units Subcutaneous TID WC  . insulin aspart  0-5 Units Subcutaneous QHS  . insulin aspart  3 Units Subcutaneous TID WC  . insulin glargine  52 Units Subcutaneous QHS  . ipratropium-albuterol  3 mL Nebulization Q6H  . levofloxacin (LEVAQUIN) IV  750 mg Intravenous Q24H  . levothyroxine  112 mcg Oral QAC breakfast  . metoprolol succinate  25 mg Oral Daily  . pantoprazole (PROTONIX) IV  40 mg Intravenous Q12H  . tiZANidine  4 mg Oral TID   Continuous Infusions: . heparin 1,350 Units/hr (12/11/13 1017)    Principal Problem:   Chest pain Active Problems:   Anemia   Arteriosclerotic cardiovascular disease (ASCVD)   Type II or unspecified type diabetes mellitus with unspecified complication, uncontrolled   Hypothyroidism   Hyperlipidemia   Hypertension   Morbid obesity   Gastroesophageal reflux disease   Hyperglycemia   NSTEMI (non-ST elevated myocardial infarction)   COPD (chronic obstructive pulmonary disease)    Time spent: 30 mintes    [facility] CoastBLACK,Sandee Bernath M  [facility] Pager [phone]. If 7PM-7AM, please contact night-coverage at [email or web address], password [email or web address] 12/11/2013, 10:19 AM  LOS: 1 day

## 2013-12-11 NOTE — Progress Notes (Signed)
Heparin level 0.25 pharmacist on call made aware new orders to increase heparin gtt to 1150u/hr (11.5 on pump) draw another heparin level 6 hrs later ( @ 8a) patient made aware.

## 2013-12-11 NOTE — Progress Notes (Signed)
Consulting cardiologist: Branch Primary Cardiologist: Formerly Rothbart-Ross saw on consult  Subjective:    No complaints of chest pain.    Objective:   Temp:  [97.2 F (36.2 C)-98 F (36.7 C)] 97.7 F (36.5 C) (03/31 0455) Pulse Rate:  [78-101] 81 (03/31 0747) Resp:  [13-22] 13 (03/31 0747) BP: (100-151)/(50-84) 112/50 mmHg (03/31 0455) SpO2:  [92 %-100 %] 98 % (03/31 0747) Weight:  [283 lb 6.4 oz (128.549 kg)-292 lb 6.4 oz (132.632 kg)] 292 lb 6.4 oz (132.632 kg) (03/31 0455) Last BM Date: 12/10/13  Filed Weights   12/10/13 0641 12/10/13 1134 12/11/13 0455  Weight: 281 lb (127.461 kg) 283 lb 6.4 oz (128.549 kg) 292 lb 6.4 oz (132.632 kg)    Intake/Output Summary (Last 24 hours) at 12/11/13 0900 Last data filed at 12/11/13 0458  Gross per 24 hour  Intake    480 ml  Output   2100 ml  Net  -1620 ml    Telemetry:NSR   Exam:  General: No acute distress.  HEENT: Conjunctiva and lids normal, oropharynx clear.  Lungs: Inspiratory wheezes.  Cardiac: No elevated JVP or bruits. RRR, no gallop or rub.   Abdomen: Normoactive bowel sounds, nontender, nondistended.  Extremities: No pitting edema, distal pulses full.  Neuropsychiatric: Alert and oriented x3, affect appropriate. Tearful.  Cardiac Cath: 2013 1. Severe 3 vessel obstructive atherosclerotic coronary disease.  2. Patent LIMA graft to the LAD.  3. Patent saphenous vein graft to the first obtuse marginal vessel  4. Patent saphenous vein graft to the diagonal with an 80-90% stenosis in the mid vein graft.  5. Patent saphenous vein graft to the PDA with a 90% stenosis in the distal vein graft.  6. Normal left and are function.  7. Normal right heart pressures.  Lab Results:  Basic Metabolic Panel:  Recent Labs Lab 12/10/13 0718 12/10/13 1430 12/11/13 0440  NA 137 136* 137  K 4.3 4.1 3.2*  CL 94* 92* 95*  CO2 28 24 26   GLUCOSE 410* 337* 200*  BUN 15 15 18   CREATININE 0.64 0.62 0.73  CALCIUM 9.8  9.8 9.3    CBC:  Recent Labs Lab 12/10/13 0718 12/11/13 0440  WBC 6.7 6.1  HGB 14.5 13.1  HCT 43.7 39.5  MCV 92.6 91.9  PLT 218 227    Cardiac Enzymes:  Recent Labs Lab 12/10/13 0718 12/10/13 1430 12/10/13 2204  TROPONINI <0.30 0.83* 5.89*    Radiology: Dg Chest 2 View  12/10/2013   CLINICAL DATA:  Chest pain  EXAM: CHEST  2 VIEW  COMPARISON:  DG CHEST 2 VIEW dated 08/17/2013; CT ANGIO CHEST W/CM &/OR WO/CM dated 10/11/2011  FINDINGS: Cardiomegaly is present although a component of the enlarged cardiopericardial silhouette is attributable to prominent epicardial adipose tissue.  Prior CABG and prior coronary artery stenting.  Stable lingular scarring.  Azygos fissure noted.  Indistinct pulmonary vasculature suggesting pulmonary venous hypertension. No overt edema.  Thoracic spondylosis noted. Large lung volumes raise the possibility of mild air trapping.  IMPRESSION: 1. Cardiomegaly with mild pulmonary venous hypertension. 2. Large lung volumes raise a possibility of air trapping. 3. Thoracic spondylosis.   Electronically Signed   By: Herbie Baltimore M.D.   On: 12/10/2013 08:31       Medications:   Scheduled Medications: . aspirin  325 mg Oral Daily  . atorvastatin  10 mg Oral q1800  . Canagliflozin  100 mg Oral Daily  . insulin aspart  0-20 Units Subcutaneous TID WC  .  insulin aspart  0-5 Units Subcutaneous QHS  . insulin glargine  70 Units Subcutaneous QHS  . ipratropium-albuterol  3 mL Nebulization Q6H  . levofloxacin (LEVAQUIN) IV  750 mg Intravenous Q24H  . levothyroxine  112 mcg Oral QAC breakfast  . metoprolol succinate  25 mg Oral Daily  . pantoprazole (PROTONIX) IV  40 mg Intravenous Q12H  . tiZANidine  4 mg Oral TID    Infusions: . heparin 1,150 Units/hr (12/11/13 0218)    PRN Medications: acetaminophen, acetaminophen, ALPRAZolam, alum & mag hydroxide-simeth, guaiFENesin-dextromethorphan, HYDROcodone-acetaminophen, morphine injection, nitroGLYCERIN,  ondansetron (ZOFRAN) IV, ondansetron   Assessment and Plan:   1.NSTEMI: She presented with chest discomfort and shortness of breath with what was thought to be pleuritic pain, however cardiac markers are found to be positive with highest level of 5.89, from presenting level 0.83. EKG Normal sinus rhythm ST & T wave abnormality, inferior ischemia. I have discussed this with the patient with known history of CAD and need to proceed with cardiac cath..   She wishes to think about transfer to [facility] and is not ready to proceed with this currently. I have discussed the risks and benefits of repeat cardiac cath. She verbalizes understanding but wants to wait and think about it, call family. She is clearly distressed and tearful. Continue heparin gtt.  No further chest pain.   2. CAD: PTCA and stenting of SVG to PDA and SVG to diagonal on 10/18/2011.                CABG 2003-LIMA to LAD, SVG to diagonal, SVG to OM, SVG to PDA, per Dr. Owen  Continue statin, metoprolol and ASA.   3.COPD:Ongoing symptoms with bronchitis. Treatment with nebulizers and abx. Feels better, still coughing productively.   4. Diabetes: Ongoing treatment per PCP  5. Hypothyroidism: TSH: 4,78.Continues synthroid.  Kathryn M. Lawrence NP [facility] 12/11/2013, 9:00 AM  Attending Note Patient seen and discussed with NP Lawrence, agree with above documentation. 62 yo female history of prior CABG in 2003 (last cath 2013 LM diffuse disease, LAD 100% ostial, LCX 100%, LCX small, OM 100%, RCA 90% prox mid, PDA 90-95%. SVG-OM patent, SVG-diag 80-90%, SVG-PDA 90% distal graft. LIMA-LAD patent. She had DES to SVG-PDA at that time and DES to SVG-diag. LVEF 55-65% at that time. Admitted with chest pain with associated diaphoresis. EKG with variable baseline but ST depressions inferior leads and anterolateral leads, which have been noted in prior EKGs. Trop 5.89, presentation consistent with NSTEMI. She is currently pain free and  hemodynamically stable. TIMI score >3 consistent with high risk, she has been started on anticoag. She is also on ASA, atorva, metop. Consider ACE after cath and dye load. Will change to high dose statin and load with plavix 600mg in setting of NSTEMI with anticipated intervention, transfer to [facility] for cath. Lipid panel drawn 4 pm, unlikely a true fasting panel, recommend repeating. In setting of NSTEMI changing to high dose statin either way.    Jonathan Branch MD 

## 2013-12-11 NOTE — Progress Notes (Signed)
Dr. Butler Denmark text paged results of troponin results @ 2230 was 5.89, patient denies chest pain, VSS, remains SR on tele with no iregularity, EKG obtained- show SR, will continue to monitor.

## 2013-12-11 NOTE — Progress Notes (Signed)
Report given to [employer] [facility] at bedside.  Report called to [facility].  Pt stable and aware of transfer plan.

## 2013-12-11 NOTE — Progress Notes (Signed)
Inpatient Diabetes Program Recommendations  AACE/ADA: New Consensus Statement on Inpatient Glycemic Control (2013)  Target Ranges:  Prepandial:   less than 140 mg/dL      Peak postprandial:   less than 180 mg/dL (1-2 hours)      Critically ill patients:  140 - 180 mg/dL   Results for [name], Karla OMORI (MRN [id number]) as of 12/11/2013 07:28  Ref. Range 12/10/2013 12:09 12/10/2013 17:22 12/10/2013 21:17  Glucose-Capillary Latest Range: 70-99 mg/dL 950 (H) 932 (H) 671 (H)  Results for Person, [name] (MRN [id number]) as of 12/11/2013 07:28  Ref. Range 12/11/2013 04:40  Glucose Latest Range: 70-99 mg/dL 382 (H)   Diabetes history: DM2 Outpatient Diabetes medications: Lantus 50 units QHS, Novolog sliding scale ACHS, 2am, Invokana 300 mg daily Current orders for Inpatient glycemic control: Lantus 70 units QHS, Novolog 0-20 units AC, Novolog 0-5 units HS, Invokana 100 mg daily  Inpatient Diabetes Program Recommendations Insulin - Basal: Noted patient only took Lantus 50 units last night at bedtime (ordered Lantus 70 units QHS). Please consider changing Lantus to 52 units QHS. Insulin - Meal Coverage: Please consider ordering Novolog 3 units TID with meals for meal coverage.  Note: Talked with the patient over the phone to verify home diabetes medications. Patient reports that she takes Lantus 50 units QHS, Novolog sliding scale ACHS & 2am, and Invokana 300 mg daily (Invokana recently increased from 100 mg to 300 mg daily) as an outpatient for diabetes management. Noted patient is ordered Lantus 70 units QHS and patient would only take Lantus 50 units last night. Also noted that patient refused Novolog bedtime correction last night.  Please consider changing Lantus dose to 52 units QHS and consider ordering Novolog 3 units TID with meals for meal coverage.  Will continue to follow.  Thanks, Orlando Penner, RN, MSN, CCRN Diabetes Coordinator Inpatient Diabetes Program [phone] (Team Pager) [phone]  ([facility] office) [phone] [facility] office)

## 2013-12-11 NOTE — Progress Notes (Signed)
No stepdown beds available at this time.  Spoke with Dr Wyline Mood, ok to transfer to telemetry floor.  Cath lab made aware as well as bed control.

## 2013-12-12 ENCOUNTER — Encounter (HOSPITAL_COMMUNITY)
Admission: EM | Disposition: A | Discharge status: Home / Self Care | Payer: Medicare Other | Source: Home / Self Care | Attending: Cardiovascular Disease

## 2013-12-12 DIAGNOSIS — I214 Non-ST elevation (NSTEMI) myocardial infarction: Secondary | ICD-10-CM

## 2013-12-12 DIAGNOSIS — I25810 Atherosclerosis of coronary artery bypass graft(s) without angina pectoris: Secondary | ICD-10-CM

## 2013-12-12 HISTORY — PX: LEFT HEART CATHETERIZATION WITH CORONARY/GRAFT ANGIOGRAM: SHX5450

## 2013-12-12 HISTORY — PX: CARDIAC CATHETERIZATION: SHX172

## 2013-12-12 LAB — GLUCOSE, CAPILLARY
Glucose-Capillary: 151 mg/dL — ABNORMAL HIGH (ref 70–99)
Glucose-Capillary: 132 mg/dL — ABNORMAL HIGH (ref 70–99)
Glucose-Capillary: 173 mg/dL — ABNORMAL HIGH (ref 70–99)

## 2013-12-12 LAB — BASIC METABOLIC PANEL
BUN: 13 mg/dL (ref 6–23)
CALCIUM: 8.6 mg/dL (ref 8.4–10.5)
CO2: 22 mEq/L (ref 19–32)
Chloride: 99 mEq/L (ref 96–112)
Creatinine, Ser: 0.58 mg/dL (ref 0.50–1.10)
GFR calc Af Amer: >90 mL/min (ref >90)
GLUCOSE: 182 mg/dL — AB (ref 70–99)
Potassium: 4 mEq/L (ref 3.7–5.3)
SODIUM: 135 meq/L — AB (ref 137–147)

## 2013-12-12 LAB — HEPARIN LEVEL (UNFRACTIONATED): Heparin Unfractionated: 0.2 IU/mL — ABNORMAL LOW (ref 0.30–0.70)

## 2013-12-12 LAB — POCT ACTIVATED CLOTTING TIME: Activated Clotting Time: 426 seconds

## 2013-12-12 SURGERY — LEFT HEART CATHETERIZATION WITH CORONARY/GRAFT ANGIOGRAM
Anesthesia: LOCAL

## 2013-12-12 MED ORDER — SODIUM CHLORIDE 0.9 % IV SOLN
1.7500 mg/kg/h | INTRAVENOUS | Status: DC
  Administered 2013-12-12: 1.75 mg/kg/h via INTRAVENOUS
  Filled 2013-12-12 (×2): qty 250

## 2013-12-12 MED ORDER — MIDAZOLAM HCL 2 MG/2ML IJ SOLN
INTRAMUSCULAR | Status: AC
  Filled 2013-12-12: qty 2

## 2013-12-12 MED ORDER — FENTANYL CITRATE 0.05 MG/ML IJ SOLN
INTRAMUSCULAR | Status: AC
  Filled 2013-12-12: qty 2

## 2013-12-12 MED ORDER — BIVALIRUDIN 250 MG IV SOLR
INTRAVENOUS | Status: AC
  Filled 2013-12-12: qty 250

## 2013-12-12 MED ORDER — NITROGLYCERIN 0.2 MG/ML ON CALL CATH LAB
INTRAVENOUS | Status: AC
  Filled 2013-12-12: qty 1

## 2013-12-12 MED ORDER — LIDOCAINE HCL (PF) 1 % IJ SOLN
INTRAMUSCULAR | Status: AC
  Filled 2013-12-12: qty 30

## 2013-12-12 MED ORDER — HEPARIN (PORCINE) IN NACL 2-0.9 UNIT/ML-% IJ SOLN
INTRAMUSCULAR | Status: AC
  Filled 2013-12-12: qty 1000

## 2013-12-12 MED ORDER — INSULIN GLARGINE 100 UNIT/ML ~~LOC~~ SOLN
52.0000 [IU] | Freq: Every day | SUBCUTANEOUS | Status: DC
  Administered 2013-12-13: 52 [IU] via SUBCUTANEOUS
  Filled 2013-12-12 (×2): qty 0.52

## 2013-12-12 MED ORDER — IPRATROPIUM-ALBUTEROL 0.5-2.5 (3) MG/3ML IN SOLN
3.0000 mL | Freq: Two times a day (BID) | RESPIRATORY_TRACT | Status: DC
  Administered 2013-12-12: 3 mL via RESPIRATORY_TRACT
  Filled 2013-12-12: qty 3

## 2013-12-12 MED ORDER — HEPARIN SODIUM (PORCINE) 1000 UNIT/ML IJ SOLN
INTRAMUSCULAR | Status: AC
  Filled 2013-12-12: qty 1

## 2013-12-12 MED ORDER — HYDROMORPHONE HCL PF 1 MG/ML IJ SOLN
INTRAMUSCULAR | Status: AC
  Filled 2013-12-12: qty 1

## 2013-12-12 MED ORDER — ASPIRIN 81 MG PO CHEW
81.0000 mg | CHEWABLE_TABLET | Freq: Every day | ORAL | Status: DC
  Administered 2013-12-14: 81 mg via ORAL
  Filled 2013-12-12 (×2): qty 1

## 2013-12-12 MED ORDER — VERAPAMIL HCL 2.5 MG/ML IV SOLN
INTRAVENOUS | Status: AC
  Filled 2013-12-12: qty 2

## 2013-12-12 MED ORDER — HEART ATTACK BOUNCING BOOK
Freq: Once | Status: AC
  Administered 2013-12-12: 03:00:00
  Filled 2013-12-12: qty 1

## 2013-12-12 MED ORDER — SODIUM CHLORIDE 0.9 % IV SOLN: INTRAVENOUS | Status: AC

## 2013-12-12 MED ORDER — INSULIN GLARGINE 100 UNIT/ML ~~LOC~~ SOLN
15.0000 [IU] | Freq: Once | SUBCUTANEOUS | Status: AC
  Administered 2013-12-12: 22:00:00 15 [IU] via SUBCUTANEOUS
  Filled 2013-12-12: qty 0.15

## 2013-12-12 NOTE — Interval H&P Note (Signed)
History and Physical Interval Note:  12/12/2013 8:30 AM  [name]  has presented today for cardiac cath  with the diagnosis of NSTEMI.  The various methods of treatment have been discussed with the patient and family. After consideration of risks, benefits and other options for treatment, the patient has consented to  Procedure(s): LEFT HEART CATHETERIZATION WITH CORONARY/GRAFT ANGIOGRAM (N/A) as a surgical intervention .  The patient's history has been reviewed, patient examined, no change in status, stable for surgery.  I have reviewed the patient's chart and labs.  Questions were answered to the patient's satisfaction.    Cath Lab Visit (complete for each Cath Lab visit)  Clinical Evaluation Leading to the Procedure:   ACS: yes  Non-ACS:    Anginal Classification: CCS IV  Anti-ischemic medical therapy: Minimal Therapy (1 class of medications)  Non-Invasive Test Results: No non-invasive testing performed  Prior CABG: Previous CABG        Edder Bellanca

## 2013-12-12 NOTE — H&P (View-Only) (Signed)
Consulting cardiologist: Zoey Gilkeson Primary Cardiologist: Formerly Rothbart-Ross saw on consult  Subjective:    No complaints of chest pain.    Objective:   Temp:  [97.2 F (36.2 C)-98 F (36.7 C)] 97.7 F (36.5 C) (03/31 0455) Pulse Rate:  [78-101] 81 (03/31 0747) Resp:  [13-22] 13 (03/31 0747) BP: (100-151)/(50-84) 112/50 mmHg (03/31 0455) SpO2:  [92 %-100 %] 98 % (03/31 0747) Weight:  [283 lb 6.4 oz (128.549 kg)-292 lb 6.4 oz (132.632 kg)] 292 lb 6.4 oz (132.632 kg) (03/31 0455) Last BM Date: 12/10/13  Filed Weights   12/10/13 0641 12/10/13 1134 12/11/13 0455  Weight: 281 lb (127.461 kg) 283 lb 6.4 oz (128.549 kg) 292 lb 6.4 oz (132.632 kg)    Intake/Output Summary (Last 24 hours) at 12/11/13 0900 Last data filed at 12/11/13 0458  Gross per 24 hour  Intake    480 ml  Output   2100 ml  Net  -1620 ml    Telemetry:NSR   Exam:  General: No acute distress.  HEENT: Conjunctiva and lids normal, oropharynx clear.  Lungs: Inspiratory wheezes.  Cardiac: No elevated JVP or bruits. RRR, no gallop or rub.   Abdomen: Normoactive bowel sounds, nontender, nondistended.  Extremities: No pitting edema, distal pulses full.  Neuropsychiatric: Alert and oriented x3, affect appropriate. Tearful.  Cardiac Cath: 2013 1. Severe 3 vessel obstructive atherosclerotic coronary disease.  2. Patent LIMA graft to the LAD.  3. Patent saphenous vein graft to the first obtuse marginal vessel  4. Patent saphenous vein graft to the diagonal with an 80-90% stenosis in the mid vein graft.  5. Patent saphenous vein graft to the PDA with a 90% stenosis in the distal vein graft.  6. Normal left and are function.  7. Normal right heart pressures.  Lab Results:  Basic Metabolic Panel:  Recent Labs Lab 12/10/13 0718 12/10/13 1430 12/11/13 0440  NA 137 136* 137  K 4.3 4.1 3.2*  CL 94* 92* 95*  CO2 28 24 26   GLUCOSE 410* 337* 200*  BUN 15 15 18   CREATININE 0.64 0.62 0.73  CALCIUM 9.8  9.8 9.3    CBC:  Recent Labs Lab 12/10/13 0718 12/11/13 0440  WBC 6.7 6.1  HGB 14.5 13.1  HCT 43.7 39.5  MCV 92.6 91.9  PLT 218 227    Cardiac Enzymes:  Recent Labs Lab 12/10/13 0718 12/10/13 1430 12/10/13 2204  TROPONINI <0.30 0.83* 5.89*    Radiology: Dg Chest 2 View  12/10/2013   CLINICAL DATA:  Chest pain  EXAM: CHEST  2 VIEW  COMPARISON:  DG CHEST 2 VIEW dated 08/17/2013; CT ANGIO CHEST W/CM &/OR WO/CM dated 10/11/2011  FINDINGS: Cardiomegaly is present although a component of the enlarged cardiopericardial silhouette is attributable to prominent epicardial adipose tissue.  Prior CABG and prior coronary artery stenting.  Stable lingular scarring.  Azygos fissure noted.  Indistinct pulmonary vasculature suggesting pulmonary venous hypertension. No overt edema.  Thoracic spondylosis noted. Large lung volumes raise the possibility of mild air trapping.  IMPRESSION: 1. Cardiomegaly with mild pulmonary venous hypertension. 2. Large lung volumes raise a possibility of air trapping. 3. Thoracic spondylosis.   Electronically Signed   By: Herbie Baltimore M.D.   On: 12/10/2013 08:31       Medications:   Scheduled Medications: . aspirin  325 mg Oral Daily  . atorvastatin  10 mg Oral q1800  . Canagliflozin  100 mg Oral Daily  . insulin aspart  0-20 Units Subcutaneous TID WC  .  insulin aspart  0-5 Units Subcutaneous QHS  . insulin glargine  70 Units Subcutaneous QHS  . ipratropium-albuterol  3 mL Nebulization Q6H  . levofloxacin (LEVAQUIN) IV  750 mg Intravenous Q24H  . levothyroxine  112 mcg Oral QAC breakfast  . metoprolol succinate  25 mg Oral Daily  . pantoprazole (PROTONIX) IV  40 mg Intravenous Q12H  . tiZANidine  4 mg Oral TID    Infusions: . heparin 1,150 Units/hr (12/11/13 0218)    PRN Medications: acetaminophen, acetaminophen, ALPRAZolam, alum & mag hydroxide-simeth, guaiFENesin-dextromethorphan, HYDROcodone-acetaminophen, morphine injection, nitroGLYCERIN,  ondansetron (ZOFRAN) IV, ondansetron   Assessment and Plan:   1.NSTEMI: She presented with chest discomfort and shortness of breath with what was thought to be pleuritic pain, however cardiac markers are found to be positive with highest level of 5.89, from presenting level 0.83. EKG Normal sinus rhythm ST & T wave abnormality, inferior ischemia. I have discussed this with the patient with known history of CAD and need to proceed with cardiac cath..   [facility] and is not ready to proceed with this currently. I have discussed the risks and benefits of repeat cardiac cath. She verbalizes understanding but wants to wait and think about it, call family. She is clearly distressed and tearful. Continue heparin gtt.  No further chest pain.   2. CAD: PTCA and stenting of SVG to PDA and SVG to diagonal on 10/18/2011.                CABG 2003-LIMA to LAD, SVG to diagonal, SVG to OM, SVG to PDA, per Dr. Cornelius Moraswen  Continue statin, metoprolol and ASA.   3.COPD:Ongoing symptoms with bronchitis. Treatment with nebulizers and abx. Feels better, still coughing productively.   4. Diabetes: Ongoing treatment per PCP  5. Hypothyroidism: TSH: 4,78.Continues synthroid.  Bettey MareKathryn M. Lyman BishopLawrence NP [facility] 12/11/2013, 9:00 AM  Attending Note Patient seen and discussed with NP Lyman BishopLawrence, agree with above documentation. 63 yo female history of prior CABG in 2003 (last cath 2013 LM diffuse disease, LAD 100% ostial, LCX 100%, LCX small, OM 100%, RCA 90% prox mid, PDA 90-95%. SVG-OM patent, SVG-diag 80-90%, SVG-PDA 90% distal graft. LIMA-LAD patent. She had DES to SVG-PDA at that time and DES to SVG-diag. LVEF 55-65% at that time. Admitted with chest pain with associated diaphoresis. EKG with variable baseline but ST depressions inferior leads and anterolateral leads, which have been noted in prior EKGs. Trop 5.89, presentation consistent with NSTEMI. She is currently pain free and  hemodynamically stable. TIMI score >3 consistent with high risk, she has been started on anticoag. She is also on ASA, atorva, metop. Consider ACE after cath and dye load. Will change to high dose statin and load with plavix 600mg  in setting of NSTEMI with anticipated intervention[facility]. Lipid panel drawn 4 pm, unlikely a true fasting panel, recommend repeating. In setting of NSTEMI changing to high dose statin either way.    Dina RichJonathan Tana Trefry MD

## 2013-12-12 NOTE — CV Procedure (Signed)
Cardiac Catheterization Operative Report  Reola Mosheratsy L Silversmith [id number] 4/1/201510:12 AM Harlow AsaLUKING,W S, MD  Procedure Performed:  1. Left Heart Catheterization 2. Selective Coronary Angiography 3. SVG angiography 4. LIMA graft angiography 5. Left ventricular angiogram 6. PTCA/DES x 2 proximal and mid body of SVG to Diagonal 7. Angioseal RFA  Operator: Verne Carrowhristopher Deionte Spivack, MD  Indication:   63 yo female with history of CAD s/p 4 V CABG, DM, HTN admitted to [facility] with NSTEMI.                                 Procedure Details: The risks, benefits, complications, treatment options, and expected outcomes were discussed with the patient. The patient and/or family concurred with the proposed plan, giving informed consent. The patient was brought to the cath lab after IV hydration was begun and oral premedication was given. The patient was further sedated with Versed and Fentanyl. The left wrist was accessed with an u/s but no radial artery flow could be visualized. There was no palpable left radial artery pulse. The right groin was prepped and draped in the usual manner. Using the modified Seldinger access technique, a 5 French sheath was placed in the right femoral artery. Standard diagnostic catheters were used to perform selective coronary angiography. The JR4 catheter was used to engage the RCA, all vein grafts and the LIMA graft. A pigtail catheter was used to perform a left ventricular angiogram. She was found to have severe stenosis in the proximal body of the SVG to the Diagonal and a moderate hazy stenosis in the mid body of the SVG to the PDA. I elected to proceed to PCI of the SVG to the Diagonal.   PCI Note: She was given a bolus of Angiomax and a drip was started. When the ACT was over 200, I engaged the SVG to the Diagonal with a LCB guiding catheter. I then advanced a Cougar IC wire into the distal target vessel. A 4.0 Spider filter wire was used for distal protection. A  2.0 x 15 mm balloon as used to pre-dilate the severe stenosis. I then deployed a 3.0 x 28 mm Xience DES in the proximal body of the vein graft. After stent deployment, there was poor flow beyond the mid body of the graft. The old stent in the mid body of the graft had diffuse moderate restenosis and the distal edge of the old stent had moderately severe stenosis. I deployed a 3.0 x 33 Xience DES in an overlapping fashion with newly placed proximal stent, covering the old stent and the unstented mid body of the graft. After this, there was excellent flow into the distal body of the graft and the target vessel.   At this point, I discussed PCI of the SVG to the PDA but pt refused stating she could not stay on the table any longer. An Angioseal femoral artery closure device was placed in the right femoral artery. There were no immediate complications. The patient was taken to the recovery area in stable condition.   Hemodynamic Findings: Central aortic pressure: 103/59 Left ventricular pressure: 104/9/16  Angiographic Findings:  Left main: Very short segment, diffuse disease.   Left Anterior Descending Artery: 100% ostial occlusion. The mid and distal vessel fills from the patent IMA graft. The diagonal fills from the patent vein graft.   Circumflex Artery: Small to moderate caliber vessel with patent AV groove segment with  diffuse 40% stenosis, occluded OM1 which fills from the patent vein graft.   Right Coronary Artery: Large dominant vessel with diffuse 99% proximal stenosis. 100% mid occlusion. The PDA fills from the patent vein graft.   Graft Anatomy:  SVG to PDA is patent with 70-80% stenosis mid body of graft, hazy. Patent stent distal body of vein graft.  SVG to Diagonal is patent with 99% proximal stenosis, patent mid stent with diffuse 50% stent restenosis, 70% hazy stenosis off of the distal edge of the old stent.  SVG to OM1 is patent LIMA to mid LAD is patent  Left Ventricular  Angiogram: LVEF=40%  Impression: 1. Severe triple vessel CAD s/p CABG with 4/4 patent grafts with severe stenosis in the SVG to the Diagonal and SVG to the PDA 2. NSTEMI 3. Mild LV systolic dysfunction 4. Successful PTCA/DES x 2 SVG to Diagonal.   Recommendations: She would not let us proceed to PCI of the SVG to PDA today due to intolerance to being flat on the table with back pain despite large amounts of sedation. Will continue ASA, Plavix, beta blocker and statin and plan PCI of the SVG to the PDA tomorrow from the right radial approach if she is stable.        Complications:  None. The patient tolerated the procedure well.

## 2013-12-12 NOTE — Progress Notes (Signed)
ANTICOAGULATION CONSULT NOTE - Follow Up Consult  Pharmacy Consult for heparin Indication: NSTEMI  Labs:  Recent Labs  12/10/13 0718 12/10/13 1430  12/10/13 2204 12/11/13 0440 12/11/13 0810 12/11/13 1534 12/12/13 0132  HGB 14.5  --   --   --  13.1  --   --   --   HCT 43.7  --   --   --  39.5  --   --   --   PLT 218  --   --   --  227  --   --   --   LABPROT  --   --   --   --   --   --  13.6  --   INR  --   --   --   --   --   --  1.06  --   HEPARINUNFRC  --   --   < > 0.25* 0.26* 0.23* 0.23* 0.20*  CREATININE 0.64 0.62  --   --  0.73  --   --   --   TROPONINI <0.30 0.83*  --  5.89*  --   --   --   --   < > = values in this interval not displayed.   Assessment: 64yo female tx'd to [facility] for cath, heparin level remains subtherapeutic and has trended down.  Goal of Therapy:  Heparin level 0.3-0.7 units/ml   Plan:  Will increase heparin gtt by 2-3 units/kg/hr to 1600 units/hr and check level in 6hr.  Vernard Gambles, PharmD, BCPS  12/12/2013,2:47 AM

## 2013-12-13 ENCOUNTER — Encounter (HOSPITAL_COMMUNITY)
Admission: EM | Disposition: A | Discharge status: Home / Self Care | Payer: Medicare Other | Source: Home / Self Care | Attending: Cardiovascular Disease

## 2013-12-13 DIAGNOSIS — I2510 Atherosclerotic heart disease of native coronary artery without angina pectoris: Secondary | ICD-10-CM

## 2013-12-13 HISTORY — PX: PERCUTANEOUS CORONARY STENT INTERVENTION (PCI-S): SHX5485

## 2013-12-13 HISTORY — PX: CORONARY ANGIOPLASTY WITH STENT PLACEMENT: SHX49

## 2013-12-13 LAB — CBC
HEMATOCRIT: 37.7 % (ref 36.0–46.0)
Hemoglobin: 12.5 g/dL (ref 12.0–15.0)
MCH: 30.8 pg (ref 26.0–34.0)
MCHC: 33.2 g/dL (ref 30.0–36.0)
MCV: 92.9 fL (ref 78.0–100.0)
Platelets: 209 10*3/uL (ref 150–400)
RBC: 4.06 MIL/uL (ref 3.87–5.11)
RDW: 13.3 % (ref 11.5–15.5)
WBC: 7.4 10*3/uL (ref 4.0–10.5)

## 2013-12-13 LAB — BASIC METABOLIC PANEL
BUN: 13 mg/dL (ref 6–23)
CO2: 23 mEq/L (ref 19–32)
Calcium: 9 mg/dL (ref 8.4–10.5)
Chloride: 99 mEq/L (ref 96–112)
Creatinine, Ser: 0.66 mg/dL (ref 0.50–1.10)
GFR calc Af Amer: >90 mL/min (ref >90)
Glucose, Bld: 213 mg/dL — ABNORMAL HIGH (ref 70–99)
Potassium: 4.7 mEq/L (ref 3.7–5.3)
Sodium: 134 mEq/L — ABNORMAL LOW (ref 137–147)

## 2013-12-13 LAB — GLUCOSE, CAPILLARY
GLUCOSE-CAPILLARY: 262 mg/dL — AB (ref 70–99)
Glucose-Capillary: 262 mg/dL — ABNORMAL HIGH (ref 70–99)
Glucose-Capillary: 211 mg/dL — ABNORMAL HIGH (ref 70–99)
Glucose-Capillary: 139 mg/dL — ABNORMAL HIGH (ref 70–99)
Glucose-Capillary: 173 mg/dL — ABNORMAL HIGH (ref 70–99)

## 2013-12-13 LAB — POCT ACTIVATED CLOTTING TIME: Activated Clotting Time: 398 seconds

## 2013-12-13 SURGERY — PERCUTANEOUS CORONARY STENT INTERVENTION (PCI-S)
Anesthesia: LOCAL

## 2013-12-13 MED ORDER — LIDOCAINE HCL (PF) 1 % IJ SOLN
INTRAMUSCULAR | Status: AC
  Filled 2013-12-13: qty 30

## 2013-12-13 MED ORDER — SODIUM CHLORIDE 0.9 % IJ SOLN: 3.0000 mL | INTRAMUSCULAR | Status: DC | PRN

## 2013-12-13 MED ORDER — SODIUM CHLORIDE 0.9 % IV SOLN: 250.0000 mL | INTRAVENOUS | Status: DC | PRN

## 2013-12-13 MED ORDER — SODIUM CHLORIDE 0.9 % IJ SOLN: 3.0000 mL | Freq: Two times a day (BID) | INTRAMUSCULAR | Status: DC

## 2013-12-13 MED ORDER — MIDAZOLAM HCL 2 MG/2ML IJ SOLN
INTRAMUSCULAR | Status: AC
  Filled 2013-12-13: qty 2

## 2013-12-13 MED ORDER — ASPIRIN 81 MG PO CHEW
81.0000 mg | CHEWABLE_TABLET | ORAL | Status: AC
  Administered 2013-12-13: 81 mg via ORAL
  Filled 2013-12-13: qty 1

## 2013-12-13 MED ORDER — VERAPAMIL HCL 2.5 MG/ML IV SOLN
INTRAVENOUS | Status: AC
  Filled 2013-12-13: qty 2

## 2013-12-13 MED ORDER — SODIUM CHLORIDE 0.9 % IV SOLN: INTRAVENOUS | Status: DC

## 2013-12-13 MED ORDER — IPRATROPIUM-ALBUTEROL 0.5-2.5 (3) MG/3ML IN SOLN: 3.0000 mL | Freq: Four times a day (QID) | RESPIRATORY_TRACT | Status: DC | PRN

## 2013-12-13 MED ORDER — FENTANYL CITRATE 0.05 MG/ML IJ SOLN
INTRAMUSCULAR | Status: AC
  Filled 2013-12-13: qty 2

## 2013-12-13 MED ORDER — HEPARIN (PORCINE) IN NACL 2-0.9 UNIT/ML-% IJ SOLN
INTRAMUSCULAR | Status: AC
  Filled 2013-12-13: qty 1000

## 2013-12-13 MED ORDER — BIVALIRUDIN 250 MG IV SOLR
INTRAVENOUS | Status: AC
  Filled 2013-12-13: qty 250

## 2013-12-13 MED ORDER — NITROGLYCERIN 0.2 MG/ML ON CALL CATH LAB
INTRAVENOUS | Status: AC
  Filled 2013-12-13: qty 1

## 2013-12-13 MED ORDER — SODIUM CHLORIDE 0.9 % IV SOLN
INTRAVENOUS | Status: AC
  Administered 2013-12-13: 10:00:00 via INTRAVENOUS

## 2013-12-13 MED FILL — Sodium Chloride IV Soln 0.9%: INTRAVENOUS | Qty: 50 | Status: AC

## 2013-12-13 NOTE — Progress Notes (Signed)
     SUBJECTIVE: No chest pain or SOB overnight.   BP 119/56  Pulse 90  Temp(Src) 97.2 F (36.2 C) (Oral)  Resp 18  Ht 5\' 4"  (1.626 m)  Wt 227 lb 11.8 oz (103.3 kg)  BMI 39.07 kg/m2  SpO2 98%  Intake/Output Summary (Last 24 hours) at 12/13/13 1027 Last data filed at 12/12/13 1802  Gross per 24 hour  Intake   1813 ml  Output    900 ml  Net    913 ml    PHYSICAL EXAM General: Well developed, well nourished, in no acute distress. Alert and oriented x 3.  Psych:  Good affect, responds appropriately Neck: No JVD. No masses noted.  Lungs: Clear bilaterally with no wheezes or rhonci noted.  Heart: RRR with no murmurs noted. Abdomen: Bowel sounds are present. Soft, non-tender.  Extremities: No lower extremity edema.   LABS: Basic Metabolic Panel:  Recent Labs  25/36/64 0515 12/13/13 0520  NA 135* 134*  K 4.0 4.7  CL 99 99  CO2 22 23  GLUCOSE 182* 213*  BUN 13 13  CREATININE 0.58 0.66  CALCIUM 8.6 9.0   CBC:  Recent Labs  12/11/13 0440 12/13/13 0520  WBC 6.1 7.4  HGB 13.1 12.5  HCT 39.5 37.7  MCV 91.9 92.9  PLT 227 209   Cardiac Enzymes:  Recent Labs  12/10/13 1430 12/10/13 2204  TROPONINI 0.83* 5.89*   Fasting Lipid Panel:  Recent Labs  12/10/13 1430  CHOL 320*  HDL 38*  LDLCALC 236*  TRIG 231*  CHOLHDL 8.4    Current Meds: . aspirin  81 mg Oral Daily  . atorvastatin  80 mg Oral q1800  . Canagliflozin  100 mg Oral Daily  . clopidogrel  75 mg Oral Q breakfast  . insulin aspart  0-20 Units Subcutaneous TID WC  . insulin aspart  0-5 Units Subcutaneous QHS  . insulin aspart  3 Units Subcutaneous TID WC  . insulin glargine  52 Units Subcutaneous QHS  . ipratropium-albuterol  3 mL Nebulization BID  . latanoprost  1 drop Both Eyes QHS  . levofloxacin (LEVAQUIN) IV  750 mg Intravenous Q24H  . levothyroxine  125 mcg Oral QAC breakfast  . metoprolol succinate  25 mg Oral Daily  . pantoprazole (PROTONIX) IV  40 mg Intravenous Q12H  . sodium  chloride  3 mL Intravenous Q12H  . tiZANidine  4 mg Oral TID     ASSESSMENT AND PLAN:  1. CAD/NSTEMI: Pt was transferred here from [facility] for cath with NSTEMI. Cardiac cath 12/12/13 with severe stenosis SVG to Diagonal treated with overlapping DES (covering old stent that had restenosis). She has residual disease in the SVG to PDA but we case was stopped yesterday due to pt's back pain on table. Plan PCI of SVG to PDA today.    MCALHANY,CHRISTOPHER  4/2/20158:06 AM

## 2013-12-13 NOTE — CV Procedure (Signed)
     Cardiac Catheterization Operative Report  [name] [id number] 4/2/20159:23 AM Harlow Asa, MD  Procedure Performed:  1.  PTCA/DES x 1 mid body of SVG to PDA 2. Angioseal RFA   Operator: Verne Carrow, MD  Indication:   63 yo female with CAD s/p CABG, NSTEMI, PCI of SVG to Diagonal yesterday. Staged PCI of SVG to PDA today.                                     Procedure Details: The risks, benefits, complications, treatment options, and expected outcomes were discussed with the patient. The patient and/or family concurred with the proposed plan, giving informed consent. The patient was brought to the cath lab after IV hydration was begun and oral premedication was given. The patient was further sedated with Versed and Fentanyl. The right groin was prepped and draped in the usual manner. Using the modified Seldinger access technique, a 6 French sheath was placed in the right femoral artery. She was given a weight based bolus of Angiomax and a drip was started. She had been loaded with Plavix. I engaged the SVG to the PDA with a JR4 guiding catheter. When the ACT was over 200, I passed a Cougar IC wire down the SVG. I then placed a 4.0 Spider filter wire over the Cougar wire and removed the Cougar wire. I then used a 2.5 x 12 mm balloon to pre-dilate the severe stenosis in the mid body of the SVG. I then carefully positioned and deployed a 3.0 x 18 mm Xience DES in the mid body of the SVG. The filter was removed. There was excellent flow down the vessel. The stenosis was taken from 80% down to 0%. This was a hazy appearing lesion.   There were no immediate complications. The patient was taken to the recovery area in stable condition.   Hemodynamic Findings: Central aortic pressure: 110/64  Impression: 1. Unstable angina/NSTEMI 2. Successful PTCA/DES x 1 mid body of SVG to PDA  Recommendations: Continue ASA and Plavix. Possible d/c home in the am.        Complications:   None. The patient tolerated the procedure well.

## 2013-12-13 NOTE — H&P (View-Only) (Signed)
     SUBJECTIVE: No chest pain or SOB overnight.   BP 119/56  Pulse 90  Temp(Src) 97.2 F (36.2 C) (Oral)  Resp 18  Ht 5' 4" (1.626 m)  Wt 227 lb 11.8 oz (103.3 kg)  BMI 39.07 kg/m2  SpO2 98%  Intake/Output Summary (Last 24 hours) at 12/13/13 0806 Last data filed at 12/12/13 1802  Gross per 24 hour  Intake   1813 ml  Output    900 ml  Net    913 ml    PHYSICAL EXAM General: Well developed, well nourished, in no acute distress. Alert and oriented x 3.  Psych:  Good affect, responds appropriately Neck: No JVD. No masses noted.  Lungs: Clear bilaterally with no wheezes or rhonci noted.  Heart: RRR with no murmurs noted. Abdomen: Bowel sounds are present. Soft, non-tender.  Extremities: No lower extremity edema.   LABS: Basic Metabolic Panel:  Recent Labs  12/12/13 0515 12/13/13 0520  NA 135* 134*  K 4.0 4.7  CL 99 99  CO2 22 23  GLUCOSE 182* 213*  BUN 13 13  CREATININE 0.58 0.66  CALCIUM 8.6 9.0   CBC:  Recent Labs  12/11/13 0440 12/13/13 0520  WBC 6.1 7.4  HGB 13.1 12.5  HCT 39.5 37.7  MCV 91.9 92.9  PLT 227 209   Cardiac Enzymes:  Recent Labs  12/10/13 1430 12/10/13 2204  TROPONINI 0.83* 5.89*   Fasting Lipid Panel:  Recent Labs  12/10/13 1430  CHOL 320*  HDL 38*  LDLCALC 236*  TRIG 231*  CHOLHDL 8.4    Current Meds: . aspirin  81 mg Oral Daily  . atorvastatin  80 mg Oral q1800  . Canagliflozin  100 mg Oral Daily  . clopidogrel  75 mg Oral Q breakfast  . insulin aspart  0-20 Units Subcutaneous TID WC  . insulin aspart  0-5 Units Subcutaneous QHS  . insulin aspart  3 Units Subcutaneous TID WC  . insulin glargine  52 Units Subcutaneous QHS  . ipratropium-albuterol  3 mL Nebulization BID  . latanoprost  1 drop Both Eyes QHS  . levofloxacin (LEVAQUIN) IV  750 mg Intravenous Q24H  . levothyroxine  125 mcg Oral QAC breakfast  . metoprolol succinate  25 mg Oral Daily  . pantoprazole (PROTONIX) IV  40 mg Intravenous Q12H  . sodium  chloride  3 mL Intravenous Q12H  . tiZANidine  4 mg Oral TID     ASSESSMENT AND PLAN:  1. CAD/NSTEMI: Pt was transferred here from [facility] for cath with NSTEMI. Cardiac cath 12/12/13 with severe stenosis SVG to Diagonal treated with overlapping DES (covering old stent that had restenosis). She has residual disease in the SVG to PDA but we case was stopped yesterday due to pt's back pain on table. Plan PCI of SVG to PDA today.    Taunja Brickner  4/2/20158:06 AM  

## 2013-12-13 NOTE — Progress Notes (Signed)
[id number] Cardiac Rehab Started MI and stent education with pt. We discussed diabetic diet, plavix. MI, risk factors and Outpt CRP. Pt states that she needs to lose weight and really start eating better. I gave her encouragement. Pt agrees to Outpt. CRP in [place], will send referral. Will follow pt tomorrow to ambulate and continue education. Beatrix Fetters, RN 12/13/2013 3:42 PM

## 2013-12-13 NOTE — Interval H&P Note (Signed)
History and Physical Interval Note:  12/13/2013 8:10 AM  [name]  has presented today for cardiac cath with the diagnosis of CAD, severe stenosis SVG to PDA. The various methods of treatment have been discussed with the patient and family. After consideration of risks, benefits and other options for treatment, the patient has consented to  Procedure(s): PERCUTANEOUS CORONARY STENT INTERVENTION (PCI-S) (N/A) as a surgical intervention .  The patient's history has been reviewed, patient examined, no change in status, stable for surgery.  I have reviewed the patient's chart and labs.  Questions were answered to the patient's satisfaction.    Cath Lab Visit (complete for each Cath Lab visit)  Clinical Evaluation Leading to the Procedure:   ACS: yes  Non-ACS:    Anginal Classification: CCS IV  Anti-ischemic medical therapy: Minimal Therapy (1 class of medications)  Non-Invasive Test Results: No non-invasive testing performed  Prior CABG: Previous CABG        Muna Demers

## 2013-12-14 ENCOUNTER — Encounter (HOSPITAL_COMMUNITY): Payer: Self-pay | Admitting: Cardiology

## 2013-12-14 ENCOUNTER — Other Ambulatory Visit: Payer: Self-pay

## 2013-12-14 LAB — BASIC METABOLIC PANEL
BUN: 14 mg/dL (ref 6–23)
CALCIUM: 9.1 mg/dL (ref 8.4–10.5)
CO2: 22 mEq/L (ref 19–32)
CREATININE: 0.66 mg/dL (ref 0.50–1.10)
Chloride: 99 mEq/L (ref 96–112)
GFR calc Af Amer: >90 mL/min (ref >90)
GLUCOSE: 154 mg/dL — AB (ref 70–99)
Potassium: 4.3 mEq/L (ref 3.7–5.3)
Sodium: 135 mEq/L — ABNORMAL LOW (ref 137–147)

## 2013-12-14 LAB — CBC
HEMATOCRIT: 36.3 % (ref 36.0–46.0)
Hemoglobin: 12 g/dL (ref 12.0–15.0)
MCH: 30.5 pg (ref 26.0–34.0)
MCHC: 33.1 g/dL (ref 30.0–36.0)
MCV: 92.4 fL (ref 78.0–100.0)
Platelets: 209 10*3/uL (ref 150–400)
RBC: 3.93 MIL/uL (ref 3.87–5.11)
RDW: 13.1 % (ref 11.5–15.5)
WBC: 7.6 10*3/uL (ref 4.0–10.5)

## 2013-12-14 LAB — GLUCOSE, CAPILLARY: Glucose-Capillary: 155 mg/dL — ABNORMAL HIGH (ref 70–99)

## 2013-12-14 MED ORDER — AZITHROMYCIN 250 MG PO TABS: ORAL_TABLET | ORAL | Status: DC

## 2013-12-14 MED ORDER — ACETAMINOPHEN 325 MG PO TABS: 650.0000 mg | ORAL_TABLET | Freq: Four times a day (QID) | ORAL | Status: DC | PRN

## 2013-12-14 MED ORDER — BENZONATATE 100 MG PO CAPS: 100.0000 mg | ORAL_CAPSULE | Freq: Three times a day (TID) | ORAL | Status: DC | PRN

## 2013-12-14 MED ORDER — CLOPIDOGREL BISULFATE 75 MG PO TABS: 75.0000 mg | ORAL_TABLET | Freq: Every day | ORAL | Status: DC

## 2013-12-14 MED ORDER — ATORVASTATIN CALCIUM 80 MG PO TABS: 80.0000 mg | ORAL_TABLET | Freq: Every day | ORAL | Status: DC

## 2013-12-14 MED ORDER — HEART ATTACK BOUNCING BOOK
Freq: Once | Status: AC
  Administered 2013-12-14: 03:00:00
  Filled 2013-12-14: qty 1

## 2013-12-14 MED FILL — Sodium Chloride IV Soln 0.9%: INTRAVENOUS | Qty: 50 | Status: AC

## 2013-12-14 NOTE — Progress Notes (Signed)
TELEMETRY: Reviewed telemetry pt in NSR: Filed Vitals:   12/14/13 0200 12/14/13 0222 12/14/13 0445 12/14/13 0600  BP: 98/35  107/51 115/55  Pulse:   75   Temp:   98 F (36.7 C)   TempSrc:   Oral   Resp:   18   Height:      Weight:  292 lb 1.8 oz (132.5 kg)    SpO2:   100%     Intake/Output Summary (Last 24 hours) at 12/14/13 0837 Last data filed at 12/14/13 0446  Gross per 24 hour  Intake    690 ml  Output   1125 ml  Net   -435 ml    SUBJECTIVE Feels well this am. Still complains of productive cough with green sputum. Had 7 day course of amoxicillin prior to hospitalization. No chest pain or groin pain.  LABS: Basic Metabolic Panel:  Recent Labs  09/81/1902/10/28 0520 12/14/13 0516  NA 134* 135*  K 4.7 4.3  CL 99 99  CO2 23 22  GLUCOSE 213* 154*  BUN 13 14  CREATININE 0.66 0.66  CALCIUM 9.0 9.1   Liver Function Tests: No results found for this basename: AST, ALT, ALKPHOS, BILITOT, PROT, ALBUMIN,  in the last 72 hours No results found for this basename: LIPASE, AMYLASE,  in the last 72 hours CBC:  Recent Labs  12/13/13 0520 12/14/13 0516  WBC 7.4 7.6  HGB 12.5 12.0  HCT 37.7 36.3  MCV 92.9 92.4  PLT 209 209     Radiology/Studies:  Dg Chest 2 View  12/10/2013   CLINICAL DATA:  Chest pain  EXAM: CHEST  2 VIEW  COMPARISON:  DG CHEST 2 VIEW dated 08/17/2013; CT ANGIO CHEST W/CM &/OR WO/CM dated 10/11/2011  FINDINGS: Cardiomegaly is present although a component of the enlarged cardiopericardial silhouette is attributable to prominent epicardial adipose tissue.  Prior CABG and prior coronary artery stenting.  Stable lingular scarring.  Azygos fissure noted.  Indistinct pulmonary vasculature suggesting pulmonary venous hypertension. No overt edema.  Thoracic spondylosis noted. Large lung volumes raise the possibility of mild air trapping.  IMPRESSION: 1. Cardiomegaly with mild pulmonary venous hypertension. 2. Large lung volumes raise a possibility of air trapping. 3.  Thoracic spondylosis.   Electronically Signed   By: Herbie BaltimoreWalt  Liebkemann M.D.   On: 12/10/2013 08:31   Mm Digital Screening Bilateral  11/21/2013   CLINICAL DATA Screening.  EXAM DIGITAL SCREENING BILATERAL MAMMOGRAM WITH CAD  COMPARISON Previous exam(s)  ACR BREAST DENSITY ACR Breast Density Category a: The breast tissue is almost entirely fatty.  FINDINGS There are no findings suspicious for malignancy. Images were processed with CAD.  IMPRESSION No mammographic evidence of malignancy. A result letter of this screening mammogram will be mailed directly to the patient.  RECOMMENDATION Screening mammogram in one year. (Code:[id number]-B-01Y)  BI-RADS CATEGORY 1: Negative.  SIGNATURE  Electronically Signed   By: Britta MccreedySusan  Turner M.D.   On: 11/21/2013 09:06    PHYSICAL EXAM General: Well developed, morbidly obese, in no acute distress. Head: Normocephalic, atraumatic, sclera non-icteric, no xanthomas, nares are without discharge. Neck: Negative for carotid bruits. JVD not elevated. Lungs: Clear with scant scattered rhonchi.  Breathing is unlabored. Heart: RRR S1 S2 without murmurs, rubs, or gallops.  Abdomen: Morbidly obese, large pannus,Soft, non-tender, non-distended with normoactive bowel sounds. No hepatomegaly. No rebound/guarding. No obvious abdominal masses. Msk:  Strength and tone appears normal for age. Extremities: No clubbing, cyanosis or edema.  Distal pedal pulses are 2+ and  equal bilaterally. No groin hematoma.  Neuro: Alert and oriented X 3. Moves all extremities spontaneously. Psych:  Responds to questions appropriately with a normal affect.  ASSESSMENT AND PLAN: 1. NSTEMI. S/p CABG. S/p DES of SVG to diagonal and SVG to the RCA. No complications. Committed to [facility] for one year.  2. Acute bronchitis. Still symptomatic. Will treat with Zpak. 3. DM type 2- on insulin. 4. Morbid obesity. 5. Hyperlipidemia. On high dose statin.  6. HTN controlled.  Principal Problem:   Chest pain Active  Problems:   Anemia   Arteriosclerotic cardiovascular disease (ASCVD)   Type II or unspecified type diabetes mellitus with unspecified complication, uncontrolled   Hypothyroidism   Hyperlipidemia   Hypertension   Morbid obesity   Gastroesophageal reflux disease   Hyperglycemia   NSTEMI (non-ST elevated myocardial infarction)   COPD (chronic obstructive pulmonary disease)    Signed, Lylianna Fraiser [place] MD,FACC 12/14/2013 8:45 AM

## 2013-12-14 NOTE — Discharge Instructions (Signed)
Coronary Angiography with Stent °Coronary angiography with stent placement is a procedure to widen or open a narrow blood vessel of the heart (coronary artery). When a coronary artery becomes partially blocked, it decreases blood flow to that area. This may lead to chest pain or a heart attack (myocardial infarction). Arteries may become blocked by cholesterol buildup (plaque) in the lining or wall.  °A stent is a small piece of metal that looks like a mesh or a spring. Stent placement may be done right after a coronary angiography in which a blocked artery is found or as a treatment for a heart attack.  °LET YOUR HEALTH CARE PROVIDER KNOW ABOUT: °· Any allergies you have.   °· All medicines you are taking, including vitamins, herbs, eye drops, creams, and over-the-counter medicines.   °· Previous problems you or members of your family have had with the use of anesthetics.   °· Any blood disorders you have.   °· Previous surgeries you have had.   °· Medical conditions you have. °RISKS AND COMPLICATIONS °Generally, coronary angiography with stent is a safe procedure. However, as with any procedure, complications can occur. Possible complications include:  °· Damage to the heart or its blood vessels.   °· A return of blockage.   °· Bleeding at the site.   °· Blood clot in another part of the body.   °· Kidney injury.   °· Allergic reaction to the dye or contrast used.   °BEFORE THE PROCEDURE °· Do not eat or drink anything for 6 hours before the procedure.   °· Ask your health care provider if medicines can be taken with a sip of water.   °· Your health care provider will make sure you understand the procedure and the risks and potential complications associated with the procedure.   °PROCEDURE °· You may be given a medicine to help you relax before and during the procedure (sedative). This medicine will be given through an IV tube that is put into one of your veins.   °· The area where the catheter will be inserted  is shaved and cleaned. This is usually done in the groin but may be done in the fold of your arm (near your elbow) or in the wrist.    °· A medicine will be given to numb the area where the catheter will be inserted (local anesthetic).   °· The catheter is inserted into an artery using a guide wire. A type of X-ray (fluoroscopy) is used to help guide the catheter to the opening of the blocked artery.   °· A dye is then injected into the catheter, and X-rays are taken. The dye helps to show where any narrowing or blockages are located in the heart arteries.   °· A tiny wire is guided to the blocked spot, and a balloon is inflated to make the artery wider. The stent is expanded and crushes the plaque into the wall of the vessel. The stent holds the area open like a scaffolding and improves the blood flow.   °· Sometimes the artery may be made wider using a laser or other tools to remove plaque.   °· When the blood flow is better, the catheter is removed. The lining of the artery will grow over the stent, which stays where it was placed.   °AFTER THE PROCEDURE °· If the procedure is done through the leg, you will be kept in bed lying flat for about 6 hours. You will be instructed to not bend or cross your legs.   °· The insertion site will be checked frequently.   °· The pulse in your   feet or wrist will be checked frequently.   °· Additional blood tests, X-rays, and electrocardiography may be done. °Document Released: 03/06/2003 Document Revised: 06/20/2013 Document Reviewed: 03/08/2013 °ExitCare® Patient Information ©2014 [employer]. °Myocardial Infarction °A myocardial infarction (MI) is damage to the heart that is not reversible. It is also called a heart attack. An MI usually occurs when a heart (coronary) artery becomes blocked or narrowed. This cuts off the blood supply to the heart. When one or more of the heart (coronary) arteries becomes blocked, that area of the heart begins to die. This causes pain felt  during an MI.  °If you think you might be having an MI, call your local emergency services immediately (911 in [place] It is recommended that you take a 162 mg non-enteric coated aspirin if you do not have an aspirin allergy. Do not drive yourself to the hospital or wait to see if your symptoms go away. The sooner MI is treated, the greater the amount of heart muscle saved. Time is muscle. It can save your life. °CAUSES  °An MI can occur from: °· A gradual buildup of a fatty substance called plaque. When plaque builds up in the arteries, this condition is called atherosclerosis. This buildup can block or reduce the blood supply to the heart artery(s). °· A sudden plaque rupture within a heart artery that causes a blood clot (thrombus). A blood clot can block the heart artery which does not allow blood flow to the heart. °· A severe tightening (spasm) of the heart artery. This is a less common cause of a heart attack. When a heart artery spasms, it cuts off blood flow through the artery. Spasms can occur in heart arteries that do not have atherosclerosis. °RISK FACTORS °People at risk for an MI usually have one or more risk factors, such as: °· High blood pressure. °· High cholesterol. °· Smoking. °· Gender. Men have a higher heart attack risk. °· Overweight/obesity. °· Age. °· Family history. °· Lack of exercise. °· Diabetes. °· Stress. °· Excessive alcohol use. °· Street drug use (cocaine and methamphetamines). °SYMPTOMS  °MI symptoms can vary, such as: °· In both men and women, MI symptoms can include the following: °· Chest pain. The chest pain may feel like a crushing, squeezing, or "pressure" type feeling. MI pain can be "referred," meaning pain can be caused in one part of the body but felt in another part of the body. Referred MI pain may occur in the left arm, neck, or jaw. Pain may even be felt in the right arm. °· Shortness of breath (dyspnea). °· Heartburn or indigestion with or without vomiting, shortness  of breath, or sweating (diaphoresis). °· Sudden, cold sweats. °· Sudden lightheadedness. °· Upper back pain. °· Women can have unique MI symptoms, such as: °· Unexplained feelings of nervousness or anxiety. °· Discomfort between the shoulder blades (scapula) or upper back. °· Tingling in the hands and arms. °· In elderly people (regardless of gender), MI symptoms can be subtle, such as: °· Sweating (diaphoresis). °· Shortness of breath (dyspnea). °· General tiredness (fatigue) or not feeling well (malaise). °DIAGNOSIS  °Diagnosis of an MI involves several tests such as: °· An assessment of your vital signs such as heart rhythm, blood pressure, respiratory rate, and oxygen level. °· An EKG (ECG) to look at the electrical activity of your heart. °· Blood tests called cardiac markers are drawn at scheduled times to measure proteins or enzymes released by the damaged heart muscle. °· A   chest X-ray. °· An echocardiogram to evaluate heart motion and blood flow. °· Coronary angiography (cardiac catheterization). This is a diagnostic procedure to look at the heart arteries. °TREATMENT  °Acute Intervention. For an MI, the national standard in the [place] is to have an acute intervention in under 90 minutes from the time you get to the hospital. An acute intervention is a special procedure to open up the heart arteries. It is done in a treatment room called a "catheterization lab" (cath lab). Some hospitals do no have a cath lab. If you are having an MI and the hospital does not have a cath lab, the standard is to transport you to a hospital that has one. In the cath lab, acute intervention includes: °· Angioplasty. An angioplasty involves inserting a thin, flexible tube (catheter) into an artery in either your groin or wrist. The catheter is threaded to the heart arteries. A balloon at the end of the catheter is inflated to open a narrowed or blocked heart artery. During an angioplasty procedure, a small mesh tube  (stent) may be used to keep the heart artery open. Depending on your condition and health history, one of two types of stents may be placed: °· Drug-eluting stent (DES). A DES is coated with a medicine to prevent scar tissue from growing over the stent. With drug-eluting stents, blood thinning medicine will need to be taken for up to a year. °· Bare metal stent. This type of stent has no special coating to keep tissue from growing over it. This type of stent is used if you cannot take blood thinning medicine for a prolonged time or you need surgery in the near future. After a bare metal stent is placed, blood thinning medicine will need to be taken for about a month. °· If you are taking blood thinning medicine (anti-platelet therapy) after stent placement, do not stop taking it unless your caregiver says it is okay to do so. Make sure you understand how long you need to take the medicine. °Surgical Intervention °· If an acute intervention is not successful, surgery may be needed: °· Open heart surgery (coronary artery bypass graft, CABG). CABG takes a vein (saphenous vein) from your leg. The vein is then attached to the blocked heart artery which bypasses the blockage. This then allows blood flow to the heart muscle. °Additional Interventions °· A "clot buster" medicine (thrombolytic) may be given. This medicine can help break up a clot in the heart artery. This medicine may be given if a person cannot get to a cath lab right away. °· Intra-aortic balloon pump (IABP). If you have suffered a very severe MI and are too unstable to go to the cath lab or to surgery, an IABP may be used. This is a temporary mechanical device used to increase blood flow to the heart and reduce the workload of the heart until you are stable enough to go to the cath lab or surgery. °HOME CARE INSTRUCTIONS °After an MI, you may need the following: °· Medicine. Take medicine as directed by your caregiver. Medicines after an MI may: °· Keep  your blood from clotting easily (blood thinners). °· Control your blood pressure. °· Help lower your cholesterol. °· Control abnormal heart rhythms. °· Lifestyle changes. Under the guidance of your caregiver, lifestyle changes include: °· Quitting smoking, if you smoke. Your caregiver can help you quit. °· Being physically active. °· Maintaining a healthy weight. °· Eating a heart healthy diet. A dietitian can help   you learn healthy eating options. °· Managing diabetes. °· Reducing stress. °· Limiting alcohol intake. °SEEK IMMEDIATE MEDICAL CARE IF:  °· You have severe chest pain, especially if the pain is crushing or pressure-like and spreads to the arms, back, neck, or jaw. This is an emergency. Do not wait to see if the pain will go away. Get medical help at once. Call your local emergency services (911 in the [place] Do not drive yourself to the hospital. °· You have shortness of breath during rest, sleep, or with activity. °· You have sudden sweating or clammy skin. °· You feel sick to your stomach (nauseous) and throw up (vomit). °· You suddenly become lightheaded or dizzy. °· You feel your heart beating rapidly or you notice "skipped" beats. °MAKE SURE YOU:  °· Understand these instructions. °· Will watch your condition. °· Will get help right away if you are not doing well or get worse. °Document Released: 08/30/2005 Document Revised: 05/24/2012 Document Reviewed: 02/02/2011 °ExitCare® Patient Information ©2014 [employer]. ° °

## 2013-12-14 NOTE — Progress Notes (Signed)
CARDIAC REHAB PHASE I   PRE:  Rate/Rhythm: 77 SR  BP:  Supine:   Sitting: 127/59  Standing:    SaO2: 96 RA  MODE:  Ambulation: 146 ft   POST:  Rate/Rhythm: 112 ST  BP:  Supine:   Sitting: 150/67  Standing:    SaO2: 97 RA after walk  during walk RA 94-97% [id number] On arrival to room pt states that she had a rough night, bo sleep coughed all night. Assisted X 1 and used walker to ambulate. Gait steady with walker. Pt DOE and tires easily with walking. She coughed throughout walk. Room air sats 94-97%. Pt only able to walk 146 feet. She admits to not walking at home much today due to SOB and not being able to complete Outpt. CRP due to SOB. Pt is deconditioned and has this acute URI. I have encouraged her to push herself to walk everyday and to push herself to do Outpt. CRP. She does seem motivated.Completed education with pt. She voices understanding.  Melina Copa RN 12/14/2013 9:14 AM

## 2013-12-14 NOTE — Discharge Summary (Signed)
Patient seen and examined and history reviewed. Agree with above findings and plan. See my earlier rounding note.   [name] 12/14/2013 12:33 PM

## 2013-12-14 NOTE — Discharge Summary (Signed)
Patient ID: [name],  MRN: [id number], DOB/AGE: Jan 20, 1951 63 y.o.  Admit date: 12/10/2013 Discharge date: 12/14/2013  Primary Care Provider: Rubbie Battiest, MD Primary Cardiologist: Dr Harl Bowie  Discharge Diagnoses Principal Problem:   Unstable angina Active Problems:   CAD- CABG '03, DES to S-PDA, S-Dx 2/13, S-PDA 12/13/13   NSTEMI (non-ST elevated myocardial infarction)   Type II or unspecified type diabetes mellitus with unspecified complication, uncontrolled   Hypertension   Morbid obesity   COPD with exacerbation   Hypothyroidism   Hyperlipidemia   Gastroesophageal reflux disease   Chronic low back pain   Hyperglycemia    Procedures: Cath 12/12/13, SVG-PDA DES 12/13/13   Hospital Course:  Patient is a 63 yo morbidly obese female from [place] who has a history of CAD. She is s/p CABG 2003; Her last cath was 10/2011.  The patient underwent PTCA/DES to SVG to PDA and DES to SVG to diag.  She also a history of HTN, HL, poorly controlled DM and asthmatic COPD. She was last seen in clinic by Arnold Long in July 2014 Plavix d/c'd in Dec 2014.           The patient reports has been ill over the past few wks. About 4 wks ago she had Norovirus. Then she got an upper resp infection and her  Primary gave her ABX             On 12/10/13 she woke up with pain between shoulder blades. She had this prior to CABG though no jaw pain like she had prior to PCI. She presented to [facility] and was admitted by [facility] and seen in consult by Dr Harrington Challenger. Her Troponin turned positive - 5.89. She was transferred to [facility] 12/11/13 for further evaluation. Cath done 12/12/13 revealed severe triple vessel CAD s/p CABG with 4/4 patent grafts with severe stenosis in the SVG to the Diagonal and SVG to the PDA. At That time they were unble to proceed with PCI as she could not lay flat secondary to coughing. She was brought back to the lab 12/13/13 and underwent successful PTCA/DES x 1 mid body of SVG to PDA. We feel  she can be discharged home 12/14/13. She does still have a cough despite a course of Levaquin while hospitalized. Dr Martinique has suggested a Z pack at discharge. She has also been given an Rx for Tessalon Pearls prn, she knows not to take her back pain medication while taking these. She will contact her Primary if she has no improvement in one week. Dr Harl Bowie had mentioned starting an ACE after her cath but with her cough we will hold off on this for now. Consider starting an ARB as an OP.      Discharge Vitals:  Blood pressure 127/59, pulse 77, temperature 97.7 F (36.5 C), temperature source Tympanic, resp. rate 18, height _0  (1.626 m), weight 292 lb 1.8 oz (132.5 kg), SpO2 96.00%.    Labs: Results for orders placed during the hospital encounter of 12/10/13 (from the past 48 hour(s))  POCT ACTIVATED CLOTTING TIME     Status: None   Collection Time    12/12/13  9:40 AM      Result Value Ref Range   Activated Clotting Time 426    GLUCOSE, CAPILLARY     Status: Abnormal   Collection Time    12/12/13 11:45 AM      Result Value Ref Range   Glucose-Capillary 151 (*) 70 -  99 mg/dL   Comment 1 Notify RN    GLUCOSE, CAPILLARY     Status: Abnormal   Collection Time    12/12/13  5:27 PM      Result Value Ref Range   Glucose-Capillary 132 (*) 70 - 99 mg/dL   Comment 1 Notify RN    GLUCOSE, CAPILLARY     Status: Abnormal   Collection Time    12/12/13  8:24 PM      Result Value Ref Range   Glucose-Capillary 173 (*) 70 - 99 mg/dL  CBC     Status: None   Collection Time    12/13/13  5:20 AM      Result Value Ref Range   WBC 7.4  4.0 - 10.5 K/uL   RBC 4.06  3.87 - 5.11 MIL/uL   Hemoglobin 12.5  12.0 - 15.0 g/dL   HCT 37.7  36.0 - 46.0 %   MCV 92.9  78.0 - 100.0 fL   MCH 30.8  26.0 - 34.0 pg   MCHC 33.2  30.0 - 36.0 g/dL   RDW 13.3  11.5 - 15.5 %   Platelets 209  150 - 400 K/uL  BASIC METABOLIC PANEL     Status: Abnormal   Collection Time    12/13/13  5:20 AM      Result Value Ref  Range   Sodium 134 (*) 137 - 147 mEq/L   Potassium 4.7  3.7 - 5.3 mEq/L   Chloride 99  96 - 112 mEq/L   CO2 23  19 - 32 mEq/L   Glucose, Bld 213 (*) 70 - 99 mg/dL   BUN 13  6 - 23 mg/dL   Creatinine, Ser 0.66  0.50 - 1.10 mg/dL   Calcium 9.0  8.4 - 10.5 mg/dL   GFR calc non Af Amer >90  >90 mL/min   GFR calc Af Amer >90  >90 mL/min   Comment: (NOTE)     The eGFR has been calculated using the CKD EPI equation.     This calculation has not been validated in all clinical situations.     eGFR's persistently <90 mL/min signify possible Chronic Kidney     Disease.  GLUCOSE, CAPILLARY     Status: Abnormal   Collection Time    12/13/13  7:44 AM      Result Value Ref Range   Glucose-Capillary 262 (*) 70 - 99 mg/dL   Comment 1 Notify RN    POCT ACTIVATED CLOTTING TIME     Status: None   Collection Time    12/13/13  9:02 AM      Result Value Ref Range   Activated Clotting Time 398    GLUCOSE, CAPILLARY     Status: Abnormal   Collection Time    12/13/13 10:30 AM      Result Value Ref Range   Glucose-Capillary 211 (*) 70 - 99 mg/dL   Comment 1 Notify RN    GLUCOSE, CAPILLARY     Status: Abnormal   Collection Time    12/13/13  2:50 PM      Result Value Ref Range   Glucose-Capillary 262 (*) 70 - 99 mg/dL  GLUCOSE, CAPILLARY     Status: Abnormal   Collection Time    12/13/13  6:12 PM      Result Value Ref Range   Glucose-Capillary 139 (*) 70 - 99 mg/dL  GLUCOSE, CAPILLARY     Status: Abnormal   Collection Time  12/13/13 10:10 PM      Result Value Ref Range   Glucose-Capillary 173 (*) 70 - 99 mg/dL   Comment 1 Notify RN    CBC     Status: None   Collection Time    12/14/13  5:16 AM      Result Value Ref Range   WBC 7.6  4.0 - 10.5 K/uL   RBC 3.93  3.87 - 5.11 MIL/uL   Hemoglobin 12.0  12.0 - 15.0 g/dL   HCT 36.3  36.0 - 46.0 %   MCV 92.4  78.0 - 100.0 fL   MCH 30.5  26.0 - 34.0 pg   MCHC 33.1  30.0 - 36.0 g/dL   RDW 13.1  11.5 - 15.5 %   Platelets 209  150 - 400 K/uL    BASIC METABOLIC PANEL     Status: Abnormal   Collection Time    12/14/13  5:16 AM      Result Value Ref Range   Sodium 135 (*) 137 - 147 mEq/L   Potassium 4.3  3.7 - 5.3 mEq/L   Chloride 99  96 - 112 mEq/L   CO2 22  19 - 32 mEq/L   Glucose, Bld 154 (*) 70 - 99 mg/dL   BUN 14  6 - 23 mg/dL   Creatinine, Ser 0.66  0.50 - 1.10 mg/dL   Calcium 9.1  8.4 - 10.5 mg/dL   GFR calc non Af Amer >90  >90 mL/min   GFR calc Af Amer >90  >90 mL/min   Comment: (NOTE)     The eGFR has been calculated using the CKD EPI equation.     This calculation has not been validated in all clinical situations.     eGFR's persistently <90 mL/min signify possible Chronic Kidney     Disease.  GLUCOSE, CAPILLARY     Status: Abnormal   Collection Time    12/14/13  8:40 AM      Result Value Ref Range   Glucose-Capillary 155 (*) 70 - 99 mg/dL    Disposition:      Follow-up Information   Follow up with Jory Sims, NP On 12/31/2013. (1:10 pm)    Specialty:  Nurse Practitioner   Contact information:   [place] [address] [phone]       Discharge Medications:    Medication List    STOP taking these medications       amoxicillin-clavulanate 875-125 MG per tablet  Commonly known as:  AUGMENTIN      TAKE these medications       acetaminophen 325 MG tablet  Commonly known as:  TYLENOL  Take 2 tablets (650 mg total) by mouth every 6 (six) hours as needed for mild pain or fever (or Fever >/= 101).     ALPRAZolam 0.5 MG tablet  Commonly known as:  XANAX  Take 1 tablet (0.5 mg total) by mouth at bedtime as needed. For sleep     aspirin 81 MG tablet  Take 1 tablet (81 mg total) by mouth daily.     atorvastatin 80 MG tablet  Commonly known as:  LIPITOR  Take 1 tablet (80 mg total) by mouth daily at 6 PM.     azithromycin 250 MG tablet  Commonly known as:  ZITHROMAX Z-PAK  Take two tablets today, then one tablet daily till gone     benzonatate 100 MG capsule   Commonly known as:  TESSALON PERLES  Take 1  capsule (100 mg total) by mouth 3 (three) times daily as needed for cough.     clopidogrel 75 MG tablet  Commonly known as:  PLAVIX  Take 1 tablet (75 mg total) by mouth daily with breakfast.     diphenoxylate-atropine 2.5-0.025 MG per tablet  Commonly known as:  LOMOTIL  Take 1 tablet by mouth 4 (four) times daily as needed. For loose bowels     Fluticasone-Salmeterol 250-50 MCG/DOSE Aepb  Commonly known as:  ADVAIR  Inhale 1 puff into the lungs 2 (two) times daily as needed (shortness of breath/wheezing).     furosemide 80 MG tablet  Commonly known as:  LASIX  TAKE 1 TABLET EVERY DAY     ibuprofen 200 MG tablet  Commonly known as:  ADVIL,MOTRIN  Take 600 mg by mouth 4 (four) times daily as needed for pain.     insulin aspart 100 UNIT/ML injection  Commonly known as:  novoLOG  Inject 15-40 Units into the skin 3 times daily with meals, bedtime and 2 AM. Per sliding scale     insulin glargine 100 UNIT/ML injection  Commonly known as:  LANTUS  Inject 50 Units into the skin at bedtime.     INVOKANA 100 MG Tabs  Generic drug:  Canagliflozin  Take 100 mg by mouth daily.     latanoprost 0.005 % ophthalmic solution  Commonly known as:  XALATAN  Place 1 drop into both eyes at bedtime.     levothyroxine 125 MCG tablet  Commonly known as:  SYNTHROID, LEVOTHROID  Take 125 mcg by mouth daily before breakfast.     metoprolol succinate 25 MG 24 hr tablet  Commonly known as:  TOPROL-XL  TAKE 1 TABLET TWICE DAILY     nitroGLYCERIN 0.4 MG SL tablet  Commonly known as:  NITROSTAT  Place 1 tablet (0.4 mg total) under the tongue every 5 (five) minutes as needed. For chest pain     ONE TOUCH ULTRA TEST test strip  Generic drug:  glucose blood  1 each by Other route 4 (four) times daily.     oxyCODONE 5 MG immediate release tablet  Commonly known as:  Oxy IR/ROXICODONE  Take 1 tablet (5 mg total) by mouth as needed.     pantoprazole  40 MG tablet  Commonly known as:  PROTONIX  Take 40 mg by mouth daily as needed (Acid reflux).     potassium chloride SA 20 MEQ tablet  Commonly known as:  K-DUR,KLOR-CON  Take 20 mEq by mouth daily.     PROAIR HFA 108 (90 BASE) MCG/ACT inhaler  Generic drug:  albuterol  Inhale 3 puffs into the lungs every 4 (four) hours as needed for shortness of breath.     tiZANidine 4 MG tablet  Commonly known as:  ZANAFLEX  Take 1 tablet (4 mg total) by mouth 3 (three) times daily. Prn muscle spasms         Duration of Discharge Encounter: Greater than 30 minutes including physician time.  Angelena Form PA-C 12/14/2013 9:08 AM
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
## 2013-12-31 ENCOUNTER — Encounter: Payer: Self-pay | Admitting: Adult Health

## 2013-12-31 ENCOUNTER — Ambulatory Visit (INDEPENDENT_AMBULATORY_CARE_PROVIDER_SITE_OTHER): Payer: Medicare Other | Admitting: Adult Health

## 2013-12-31 ENCOUNTER — Encounter (INDEPENDENT_AMBULATORY_CARE_PROVIDER_SITE_OTHER): Payer: Self-pay

## 2013-12-31 VITALS — BP 145/52 | HR 76 | Ht 64.0 in | Wt 279.0 lb

## 2013-12-31 DIAGNOSIS — I10 Essential (primary) hypertension: Secondary | ICD-10-CM

## 2013-12-31 DIAGNOSIS — I2510 Atherosclerotic heart disease of native coronary artery without angina pectoris: Secondary | ICD-10-CM

## 2013-12-31 DIAGNOSIS — J441 Chronic obstructive pulmonary disease with (acute) exacerbation: Secondary | ICD-10-CM

## 2013-12-31 DIAGNOSIS — I7090 Unspecified atherosclerosis: Secondary | ICD-10-CM

## 2013-12-31 MED ORDER — LISINOPRIL 2.5 MG PO TABS: 2.5000 mg | ORAL_TABLET | Freq: Every day | ORAL | Status: DC

## 2013-12-31 NOTE — Patient Instructions (Addendum)
Your physician recommends that you schedule a follow-up appointment in: 4 months with Dr Lurena Joiner will receive a reminder letter two months in advance reminding you to call and schedule your appointment. If you don't receive this letter, please contact our office.  Your physician has recommended you make the following change in your medication:   Start Lisinopril 2.5 mg daily.  Dr Gerda Diss appt 1:00 pm 01/01/14

## 2013-12-31 NOTE — Assessment & Plan Note (Addendum)
Stable from cardiac standpoint. She is slightly elevated in BP. I will restart low dose lisinopril 2.5 mg daily for renal/cardio protection. She will continue on DAPT.  She will be seen in 4 months with Dr. Wyline Mood. Sooner should she be symptomatic.

## 2013-12-31 NOTE — Assessment & Plan Note (Addendum)
Has been on two courses of antbiotics, once pre-hospital and once during most recent hospitalization. She had been on levaquin and a Z-Pak. Continues greenish sputum with productive cough. I will refer her back to Dr. Gerda Diss for reassessment and further abx treatment at his discretion.

## 2013-12-31 NOTE — Assessment & Plan Note (Signed)
Will add low dose lisinopril 2.5 mg daily. Continue BB.

## 2013-12-31 NOTE — Progress Notes (Signed)
Name: [name]    DOB: 09/15/1950  Age: 63 y.o.  MR#: [id number]       PCP:  Harlow AsaLUKING,W S, MD      Insurance: Payor: MEDICARE / Plan: MEDICARE PART A AND B / Product Type: *No Product type* /   CC:    Chief Complaint  Patient presents with  . Coronary Artery Disease  . Chest Pain    VS Filed Vitals:   12/31/13 1250  BP: 145/52  Pulse: 76  Height: 5\' 4"  (1.626 m)  Weight: 279 lb (126.554 kg)    Weights Current Weight  12/31/13 279 lb (126.554 kg)  12/14/13 292 lb 1.8 oz (132.5 kg)  12/14/13 292 lb 1.8 oz (132.5 kg)    Blood Pressure  BP Readings from Last 3 Encounters:  12/31/13 145/52  12/14/13 127/59  12/14/13 127/59     Admit date:  (Not on file) Last encounter with RMR:  08/17/2013   Allergy Review of patient's allergies indicates no known allergies.  Current Outpatient Prescriptions  Medication Sig Dispense Refill  . acetaminophen (TYLENOL) 325 MG tablet Take 2 tablets (650 mg total) by mouth every 6 (six) hours as needed for mild pain or fever (or Fever >/= 101).      [name]. albuterol (PROAIR HFA) 108 (90 BASE) MCG/ACT inhaler Inhale 3 puffs into the lungs every 4 (four) hours as needed for shortness of breath.       . ALPRAZolam (XANAX) 0.5 MG tablet Take 1 tablet (0.5 mg total) by mouth at bedtime as needed. For sleep  30 tablet  5  . aspirin 81 MG tablet Take 1 tablet (81 mg total) by mouth daily.      [name]. atorvastatin (LIPITOR) 80 MG tablet Take 1 tablet (80 mg total) by mouth daily at 6 PM.  30 tablet  11  . benzonatate (TESSALON PERLES) 100 MG capsule Take 1 capsule (100 mg total) by mouth 3 (three) times daily as needed for cough.  20 capsule  0  . Canagliflozin (INVOKANA) 100 MG TABS Take 100 mg by mouth daily.      . clopidogrel (PLAVIX) 75 MG tablet Take 1 tablet (75 mg total) by mouth daily with breakfast.  30 tablet  11  . diphenoxylate-atropine (LOMOTIL) 2.5-0.025 MG per tablet Take 1 tablet by mouth 4 (four) times daily as needed. For loose bowels  120  tablet  3  . Fluticasone-Salmeterol (ADVAIR) 250-50 MCG/DOSE AEPB Inhale 1 puff into the lungs 2 (two) times daily as needed (shortness of breath/wheezing).       . furosemide (LASIX) 80 MG tablet TAKE 1 TABLET EVERY DAY  30 tablet  5  . insulin aspart (NOVOLOG) 100 UNIT/ML injection Inject 15-40 Units into the skin 3 times daily with meals, bedtime and 2 AM. Per sliding scale      . insulin glargine (LANTUS) 100 UNIT/ML injection Inject 50 Units into the skin at bedtime.       [name]. latanoprost (XALATAN) 0.005 % ophthalmic solution Place 1 drop into both eyes at bedtime.      [name]. levothyroxine (SYNTHROID, LEVOTHROID) 125 MCG tablet Take 125 mcg by mouth daily before breakfast.      . metoprolol succinate (TOPROL-XL) 25 MG 24 hr tablet TAKE 1 TABLET TWICE DAILY  180 tablet  1  . nitroGLYCERIN (NITROSTAT) 0.4 MG SL tablet Place 1 tablet (0.4 mg total) under the tongue every 5 (five) minutes as needed. For chest pain  25 tablet  4  .  ONE TOUCH ULTRA TEST test strip 1 each by Other route 4 (four) times daily.       [name] oxyCODONE (OXY IR/ROXICODONE) 5 MG immediate release tablet Take 1 tablet (5 mg total) by mouth as needed.  30 tablet  0  . pantoprazole (PROTONIX) 40 MG tablet Take 40 mg by mouth daily as needed (Acid reflux).      . potassium chloride SA (K-DUR,KLOR-CON) 20 MEQ tablet Take 20 mEq by mouth daily.      [name] tiZANidine (ZANAFLEX) 4 MG tablet Take 1 tablet (4 mg total) by mouth 3 (three) times daily. Prn muscle spasms  30 tablet  0   No current facility-administered medications for this visit.    Discontinued Meds:    Medications Discontinued During This Encounter  Medication Reason  . azithromycin (ZITHROMAX Z-PAK) 250 MG tablet Error  . ibuprofen (ADVIL,MOTRIN) 200 MG tablet Error    Patient Active Problem List   Diagnosis Date Noted  . Unstable angina 12/10/2013  . Hyperglycemia 12/10/2013  . NSTEMI (non-ST elevated myocardial infarction) 12/10/2013  . COPD with exacerbation  12/10/2013  . Chronic low back pain 12/04/2013  . Type II or unspecified type diabetes mellitus with unspecified complication, uncontrolled   . Hypothyroidism   . Hyperlipidemia   . Hypertension   . Morbid obesity   . Gastroesophageal reflux disease   . Irritable bowel syndrome   . CAD- CABG '03, DES to S-PDA, S-Dx 2/13, S-PDA 12/13/13 04/19/2011  . Anemia 04/13/2011    LABS    Component Value Date/Time   NA 135* 12/14/2013 0516   NA 134* 12/13/2013 0520   NA 135* 12/12/2013 0515   K 4.3 12/14/2013 0516   K 4.7 12/13/2013 0520   K 4.0 12/12/2013 0515   CL 99 12/14/2013 0516   CL 99 12/13/2013 0520   CL 99 12/12/2013 0515   CO2 22 12/14/2013 0516   CO2 23 12/13/2013 0520   CO2 22 12/12/2013 0515   GLUCOSE 154* 12/14/2013 0516   GLUCOSE 213* 12/13/2013 0520   GLUCOSE 182* 12/12/2013 0515   BUN 14 12/14/2013 0516   BUN 13 12/13/2013 0520   BUN 13 12/12/2013 0515   CREATININE 0.66 12/14/2013 0516   CREATININE 0.66 12/13/2013 0520   CREATININE 0.58 12/12/2013 0515   CALCIUM 9.1 12/14/2013 0516   CALCIUM 9.0 12/13/2013 0520   CALCIUM 8.6 12/12/2013 0515   GFRNONAA >90 12/14/2013 0516   GFRNONAA >90 12/13/2013 0520   GFRNONAA >90 12/12/2013 0515   GFRAA >90 12/14/2013 0516   GFRAA >90 12/13/2013 0520   GFRAA >90 12/12/2013 0515   CMP     Component Value Date/Time   NA 135* 12/14/2013 0516   K 4.3 12/14/2013 0516   CL 99 12/14/2013 0516   CO2 22 12/14/2013 0516   GLUCOSE 154* 12/14/2013 0516   BUN 14 12/14/2013 0516   CREATININE 0.66 12/14/2013 0516   CALCIUM 9.1 12/14/2013 0516   PROT 6.4 11/24/2010 1002   ALBUMIN 3.0* 11/24/2010 1002   AST 25 04/13/2011 0856   AST 21 11/24/2010 1002   ALT 18 04/13/2011 0856   ALKPHOS 77 04/13/2011 0856   ALKPHOS 60 11/24/2010 1002   BILITOT 0.4 04/13/2011 0856   BILITOT 0.4 11/24/2010 1002   GFRNONAA >90 12/14/2013 0516   GFRAA >90 12/14/2013 0516       Component Value Date/Time   WBC 7.6 12/14/2013 0516   WBC 7.4 12/13/2013 0520   WBC 6.1 12/11/2013 0440  HGB 12.0 12/14/2013 0516   HGB 12.5 12/13/2013 0520    HGB 13.1 12/11/2013 0440   HCT 36.3 12/14/2013 0516   HCT 37.7 12/13/2013 0520   HCT 39.5 12/11/2013 0440   HCT 41 04/13/2011 0846   MCV 92.4 12/14/2013 0516   MCV 92.9 12/13/2013 0520   MCV 91.9 12/11/2013 0440   MCV 91.1 04/13/2011 0846    Lipid Panel     Component Value Date/Time   CHOL 320* 12/10/2013 1430   TRIG 231* 12/10/2013 1430   HDL 38* 12/10/2013 1430   CHOLHDL 8.4 12/10/2013 1430   VLDL 46* 12/10/2013 1430   LDLCALC 236* 12/10/2013 1430    ABG    Component Value Date/Time   PHART 7.449* 10/15/2011 1054   PCO2ART 36.5 10/15/2011 1054   PO2ART 84.0 10/15/2011 1054   HCO3 25.3* 10/15/2011 1054   TCO2 26 10/15/2011 1054   O2SAT 97.0 10/15/2011 1054     Lab Results  Component Value Date   TSH 4.781* 12/10/2013   BNP (last 3 results) No results found for this basename: PROBNP,  in the last 8760 hours Cardiac Panel (last 3 results) No results found for this basename: CKTOTAL, CKMB, TROPONINI, RELINDX,  in the last 72 hours  Iron/TIBC/Ferritin No results found for this basename: iron, tibc, ferritin     EKG Orders placed during the hospital encounter of 12/10/13  . EKG 12-LEAD  . EKG 12-LEAD  . EKG 12-LEAD  . EKG  . EKG 12-LEAD  . EKG 12-LEAD  . EKG 12-LEAD  . EKG 12-LEAD  . EKG 12-LEAD  . EKG 12-LEAD  . EKG 12-LEAD  . EKG 12-LEAD  . EKG 12-LEAD  . EKG 12-LEAD  . EKG 12-LEAD  . EKG 12-LEAD  . EKG 12-LEAD     Prior Assessment and Plan Problem List as of 12/31/2013     Cardiovascular and Mediastinum   CAD- CABG '03, DES to S-PDA, S-Dx 2/13, S-PDA 12/13/13   Last Assessment & Plan   08/17/2013 Office Visit Written 08/17/2013  1:43 PM by Jodelle Gross, NP     She has been on Plavix for over a year. Will d/c this now per [facility] Guidelines. She will see [place] in one year unless symptomatic.    Hypertension   Last Assessment & Plan   03/29/2013 Office Visit Written 03/29/2013  2:45 PM by Jodelle Gross, NP     Well controlled at present. She will continue on current regimen.  She is followed by PCP for labs.    Unstable angina   NSTEMI (non-ST elevated myocardial infarction)     Respiratory   COPD with exacerbation     Digestive   Gastroesophageal reflux disease   Irritable bowel syndrome     Endocrine   Type II or unspecified type diabetes mellitus with unspecified complication, uncontrolled   Last Assessment & Plan   05/08/2012 Office Visit Edited 05/10/2012 10:14 AM by Kathlen Brunswick, MD     Recent A1c level of 14.8.  Dr. Fransico Him is working closely with patient to improve diabetic control.    Hypothyroidism   Last Assessment & Plan   05/08/2012 Office Visit Written 05/10/2012 10:12 AM by Kathlen Brunswick, MD     Recent PFTs demonstrate normal thyroid function.      Other   Hyperlipidemia   Last Assessment & Plan   05/08/2012 Office Visit Edited 05/14/2012  1:20 PM by Kathlen Brunswick, MD     Adequately controlled  hyperlipidemia.  Low HDL and elevated triglycerides reflect suboptimal management of diabetes, which is currently being addressed by Dr. Fransico Him.    Morbid obesity   Last Assessment & Plan   03/29/2013 Office Visit Written 03/29/2013  2:45 PM by Jodelle Gross, NP     We have discussed wt loss and her plans to increase her activity by swimming daily at the [facility]. I have encouraged her to do this    Chronic low back pain   Hyperglycemia   Anemia   Last Assessment & Plan   08/17/2013 Office Visit Written 08/17/2013  1:44 PM by Jodelle Gross, NP     CBC being drawn. Report to Dr. Gerda Diss.        Imaging: Dg Chest 2 View  12/10/2013   CLINICAL DATA:  Chest pain  EXAM: CHEST  2 VIEW  COMPARISON:  DG CHEST 2 VIEW dated 08/17/2013; CT ANGIO CHEST W/CM &/OR WO/CM dated 10/11/2011  FINDINGS: Cardiomegaly is present although a component of the enlarged cardiopericardial silhouette is attributable to prominent epicardial adipose tissue.  Prior CABG and prior coronary artery stenting.  Stable lingular scarring.  Azygos fissure noted.  Indistinct  pulmonary vasculature suggesting pulmonary venous hypertension. No overt edema.  Thoracic spondylosis noted. Large lung volumes raise the possibility of mild air trapping.  IMPRESSION: 1. Cardiomegaly with mild pulmonary venous hypertension. 2. Large lung volumes raise a possibility of air trapping. 3. Thoracic spondylosis.   Electronically Signed   By: Herbie Baltimore M.D.   On: 12/10/2013 08:31

## 2014-01-01 ENCOUNTER — Encounter: Payer: Self-pay | Admitting: Family Medicine

## 2014-01-01 ENCOUNTER — Ambulatory Visit (INDEPENDENT_AMBULATORY_CARE_PROVIDER_SITE_OTHER): Payer: Medicare Other | Admitting: Family Medicine

## 2014-01-01 VITALS — BP 134/74 | Temp 98.3°F | Ht 63.0 in | Wt 282.0 lb

## 2014-01-01 DIAGNOSIS — M545 Low back pain, unspecified: Secondary | ICD-10-CM

## 2014-01-01 DIAGNOSIS — IMO0002 Reserved for concepts with insufficient information to code with codable children: Secondary | ICD-10-CM

## 2014-01-01 DIAGNOSIS — I2510 Atherosclerotic heart disease of native coronary artery without angina pectoris: Secondary | ICD-10-CM

## 2014-01-01 DIAGNOSIS — J441 Chronic obstructive pulmonary disease with (acute) exacerbation: Secondary | ICD-10-CM

## 2014-01-01 DIAGNOSIS — E118 Type 2 diabetes mellitus with unspecified complications: Secondary | ICD-10-CM

## 2014-01-01 DIAGNOSIS — I7090 Unspecified atherosclerosis: Secondary | ICD-10-CM

## 2014-01-01 DIAGNOSIS — I10 Essential (primary) hypertension: Secondary | ICD-10-CM

## 2014-01-01 DIAGNOSIS — E1165 Type 2 diabetes mellitus with hyperglycemia: Secondary | ICD-10-CM

## 2014-01-01 DIAGNOSIS — G8929 Other chronic pain: Secondary | ICD-10-CM

## 2014-01-01 MED ORDER — FLUTICASONE-SALMETEROL 250-50 MCG/DOSE IN AEPB: 1.0000 | INHALATION_SPRAY | Freq: Two times a day (BID) | RESPIRATORY_TRACT | Status: DC | PRN

## 2014-01-01 MED ORDER — AMOXICILLIN-POT CLAVULANATE 875-125 MG PO TABS: 1.0000 | ORAL_TABLET | Freq: Two times a day (BID) | ORAL | Status: DC

## 2014-01-01 MED ORDER — ALBUTEROL SULFATE HFA 108 (90 BASE) MCG/ACT IN AERS: 3.0000 | INHALATION_SPRAY | RESPIRATORY_TRACT | Status: DC | PRN

## 2014-01-01 NOTE — Progress Notes (Signed)
   Subjective:    Patient ID: [name], female    DOB: 11-Jan-1951, 63 y.o.   MRN: [id number]  HPI Comments: Patient was seen here on 11/30/13 by Eber Jones for same symptoms.  She had a heart attack on 3/30 and they dc'd all her meds. She said she still has a congestive cough with green mucous.   Cough This is a recurrent problem. Episode onset: 11/30/13. The problem has been waxing and waning. The cough is productive of purulent sputum. Associated symptoms comments: Chest congestion. Treatments tried: Antibiotics. The treatment provided no relief.     Patient was recently in the hospital for coronary syndrome. She had stenting.  Of note she is seen before this in our office with bronchitis. Start on Levaquin and did not help much. Ex  Is given Zithromax on discharge from the hospital still bothering her.  Claims compliance with new medications. Next  Mr. diabetes overall is improved. A1c is down to the eights. Continues to see the specialist Dr. Theda Sers.  Patient has been summoned for jury duty. She wonders if she can do that she does not think so.  Review of Systems  Respiratory: Positive for cough.    No current chest pain no nausea no diaphoresis no abdominal pain    Objective:   Physical Exam   Alert significant obesity present vital stable HET moderate his congestion trace normal neck supple lungs rare rhonchi some wheezes heart rare rhythm no tachypnea no crackles ankles without edema     Assessment & Plan:  #1 subacute bronchitis patient chest x-ray earlier which revealed no infiltrate. #2 diabetes discussed control improving. #3 coronary artery disease discussed. Number for jury duty patient cannot tolerate this we will dictate a letter in this regard. Plan diet exercise discussed. Antibiotics prescribed. Symptomatic care discussed. Albuterol when necessary. Warning signs discussed. Followup with specialist as scheduled. WSL

## 2014-01-31 ENCOUNTER — Encounter (HOSPITAL_COMMUNITY): Payer: Medicare Other

## 2014-02-07 ENCOUNTER — Encounter (HOSPITAL_COMMUNITY)
Admission: RE | Admit: 2014-02-07 | Discharge: 2014-02-07 | Disposition: A | Discharge status: Home / Self Care | Payer: Medicare Other | Source: Ambulatory Visit | Attending: Cardiology | Admitting: Cardiology

## 2014-02-07 VITALS — BP 100/62 | HR 78 | Ht 64.0 in | Wt 277.9 lb

## 2014-02-07 DIAGNOSIS — I219 Acute myocardial infarction, unspecified: Secondary | ICD-10-CM

## 2014-02-07 DIAGNOSIS — Z955 Presence of coronary angioplasty implant and graft: Secondary | ICD-10-CM

## 2014-02-07 NOTE — Patient Instructions (Signed)
Pt has finished orientation and is scheduled to start CR on 02/11/14 at 11 am.  Pt has been instructed to arrive to class 15 minutes early for scheduled class. Pt has been instructed to wear comfortable clothing and shoes with rubber soles. Pt has been told to take their medications 1 hour prior to coming to class.  If the patient is not going to attend class, he/she has been instructed to call.   

## 2014-02-07 NOTE — Progress Notes (Addendum)
Patient referred to CR by Dr. Wyline Mood due to MI 410.91 and coronary stent placement V45.82. During orientation advised patient on arrival and appointment times what to wear, what to do before, during and after exercise. Reviewed attendance and class policy. Talked about inclement weather and class consultation policy. Pt is scheduled to start Cardiac Rehab on 02/11/14 at 11 am. Pt was advised to come to class 5 minutes before class starts. He was also given instructions on meeting with the dietician and attending the Family Structure classes. Pt is eager to get started. Patient was not able to do 6 minute walk test. We had to use the Nustep instead:  Rest     6-Min    Post 2-Min HR  78    91    78 BP  100/62    120/82    110/90 O2  94    98    96  RPE  9    13    9  RPD  7    11    7  Distance    .19 miles---344 steps----1.5 mets

## 2014-02-11 ENCOUNTER — Encounter (HOSPITAL_COMMUNITY)
Admission: RE | Admit: 2014-02-11 | Discharge: 2014-02-11 | Disposition: A | Discharge status: Home / Self Care | Payer: Medicare Other | Source: Ambulatory Visit | Attending: Cardiology | Admitting: Cardiology

## 2014-02-11 DIAGNOSIS — I2510 Atherosclerotic heart disease of native coronary artery without angina pectoris: Secondary | ICD-10-CM | POA: Insufficient documentation

## 2014-02-11 DIAGNOSIS — I219 Acute myocardial infarction, unspecified: Secondary | ICD-10-CM | POA: Insufficient documentation

## 2014-02-11 DIAGNOSIS — Z5189 Encounter for other specified aftercare: Secondary | ICD-10-CM | POA: Insufficient documentation

## 2014-02-11 DIAGNOSIS — Z9861 Coronary angioplasty status: Secondary | ICD-10-CM | POA: Insufficient documentation

## 2014-02-13 ENCOUNTER — Encounter (HOSPITAL_COMMUNITY): Payer: Medicare Other

## 2014-02-15 ENCOUNTER — Encounter (HOSPITAL_COMMUNITY): Payer: Medicare Other

## 2014-02-18 ENCOUNTER — Encounter (HOSPITAL_COMMUNITY): Payer: Medicare Other

## 2014-02-20 ENCOUNTER — Encounter (HOSPITAL_COMMUNITY): Payer: Medicare Other

## 2014-02-22 ENCOUNTER — Encounter (HOSPITAL_COMMUNITY): Payer: Medicare Other

## 2014-02-25 ENCOUNTER — Encounter (HOSPITAL_COMMUNITY): Payer: Medicare Other

## 2014-02-27 ENCOUNTER — Other Ambulatory Visit: Payer: Self-pay | Admitting: Family Medicine

## 2014-02-27 ENCOUNTER — Encounter (HOSPITAL_COMMUNITY): Payer: Medicare Other

## 2014-02-27 NOTE — Telephone Encounter (Signed)
Last seen 01/01/14 

## 2014-02-27 NOTE — Telephone Encounter (Signed)
Ok plus two ref 

## 2014-03-01 ENCOUNTER — Encounter (HOSPITAL_COMMUNITY): Payer: Medicare Other

## 2014-03-04 ENCOUNTER — Other Ambulatory Visit: Payer: Self-pay | Admitting: Family Medicine

## 2014-03-04 ENCOUNTER — Encounter (HOSPITAL_COMMUNITY): Payer: Medicare Other

## 2014-03-04 ENCOUNTER — Ambulatory Visit: Payer: Medicare Other | Admitting: Nurse Practitioner

## 2014-03-05 ENCOUNTER — Other Ambulatory Visit: Payer: Self-pay | Admitting: Family Medicine

## 2014-03-06 ENCOUNTER — Encounter (HOSPITAL_COMMUNITY): Payer: Medicare Other

## 2014-03-08 ENCOUNTER — Ambulatory Visit (INDEPENDENT_AMBULATORY_CARE_PROVIDER_SITE_OTHER): Payer: Medicare Other | Admitting: Family Medicine

## 2014-03-08 ENCOUNTER — Encounter (HOSPITAL_COMMUNITY): Payer: Medicare Other

## 2014-03-08 ENCOUNTER — Ambulatory Visit (HOSPITAL_COMMUNITY)
Admission: RE | Admit: 2014-03-08 | Discharge: 2014-03-08 | Disposition: A | Discharge status: Home / Self Care | Payer: Medicare Other | Source: Ambulatory Visit | Attending: Family Medicine | Admitting: Family Medicine

## 2014-03-08 ENCOUNTER — Encounter: Payer: Self-pay | Admitting: Family Medicine

## 2014-03-08 VITALS — BP 134/70 | Temp 98.3°F | Ht 63.0 in | Wt 263.1 lb

## 2014-03-08 DIAGNOSIS — M545 Low back pain, unspecified: Secondary | ICD-10-CM

## 2014-03-08 DIAGNOSIS — I700 Atherosclerosis of aorta: Secondary | ICD-10-CM | POA: Insufficient documentation

## 2014-03-08 DIAGNOSIS — I219 Acute myocardial infarction, unspecified: Secondary | ICD-10-CM

## 2014-03-08 DIAGNOSIS — M47817 Spondylosis without myelopathy or radiculopathy, lumbosacral region: Secondary | ICD-10-CM | POA: Insufficient documentation

## 2014-03-08 MED ORDER — TIZANIDINE HCL 4 MG PO TABS
4.0000 mg | ORAL_TABLET | Freq: Three times a day (TID) | ORAL | Status: DC
Start: 2014-03-08 — End: 2014-12-03

## 2014-03-08 NOTE — Progress Notes (Signed)
   Subjective:    Patient ID: [name], female    DOB: 28-Apr-1951, 63 y.o.   MRN: [id number]  Back Pain This is a new problem. The current episode started 1 to 4 weeks ago. The problem occurs constantly. The problem has been gradually worsening since onset. The pain is present in the lumbar spine. Quality: sharp. The pain does not radiate. The pain is at a severity of 10/10. The pain is moderate. The pain is the same all the time. Stiffness is present all day. She has tried chiropractic manipulation, ice, heat and NSAIDs for the symptoms. The treatment provided mild relief.   Patient has been seeing Dr. Ladona Ridgel (chiropractor) for her back pain and her insurance does not cover xrays through him. She needs xrays ordered through our office so her insurance will pay for it and copies forwarded to Dr. Ladona Ridgel.   Patient states that she has no other concerns at this time.   Started with physical therapy right mid and lower back pain. Started with a popping sensation. Minimal radiation into hips.  Review of Systems  Musculoskeletal: Positive for back pain.   no chest pain no change in urinary or bowel habits ROS otherwise negative     Objective:   Physical Exam  Alert some distress lungs clear heart regular in rhythm spine nontender negative straight leg raise positive right lower lumbar region tenderness to palpation      Assessment & Plan:  Impression lumbar strain/spasm plan x-ray per chiropractor's request. Medications prescribed. Symptomatic care discussed. Followup with chiropractor. WSL

## 2014-03-08 NOTE — Progress Notes (Signed)
Patient notified and verbalized understanding of the test results. No further questions. She has appt with chiropractor this afternoon.

## 2014-03-11 ENCOUNTER — Encounter (HOSPITAL_COMMUNITY): Payer: Medicare Other

## 2014-03-13 ENCOUNTER — Encounter (HOSPITAL_COMMUNITY): Payer: Medicare Other

## 2014-03-15 ENCOUNTER — Encounter (HOSPITAL_COMMUNITY): Payer: Medicare Other

## 2014-03-18 ENCOUNTER — Encounter (HOSPITAL_COMMUNITY): Payer: Medicare Other

## 2014-03-20 ENCOUNTER — Encounter (HOSPITAL_COMMUNITY): Payer: Medicare Other

## 2014-03-20 ENCOUNTER — Ambulatory Visit (INDEPENDENT_AMBULATORY_CARE_PROVIDER_SITE_OTHER): Payer: Medicare Other | Admitting: Family Medicine

## 2014-03-20 ENCOUNTER — Encounter: Payer: Self-pay | Admitting: Family Medicine

## 2014-03-20 VITALS — BP 132/78 | Ht 64.0 in | Wt 269.0 lb

## 2014-03-20 DIAGNOSIS — I219 Acute myocardial infarction, unspecified: Secondary | ICD-10-CM

## 2014-03-20 DIAGNOSIS — R21 Rash and other nonspecific skin eruption: Secondary | ICD-10-CM
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
## 2014-03-22 ENCOUNTER — Encounter (HOSPITAL_COMMUNITY): Payer: Medicare Other

## 2014-03-25 ENCOUNTER — Encounter (HOSPITAL_COMMUNITY): Payer: Medicare Other

## 2014-03-27 ENCOUNTER — Encounter (HOSPITAL_COMMUNITY): Payer: Medicare Other

## 2014-03-29 ENCOUNTER — Encounter (HOSPITAL_COMMUNITY): Payer: Medicare Other

## 2014-04-01 ENCOUNTER — Encounter (HOSPITAL_COMMUNITY): Payer: Medicare Other

## 2014-04-03 ENCOUNTER — Encounter (HOSPITAL_COMMUNITY): Payer: Medicare Other

## 2014-04-05 ENCOUNTER — Encounter (HOSPITAL_COMMUNITY): Payer: Medicare Other

## 2014-04-08 ENCOUNTER — Encounter (HOSPITAL_COMMUNITY): Payer: Medicare Other

## 2014-04-10 ENCOUNTER — Encounter (HOSPITAL_COMMUNITY): Payer: Medicare Other

## 2014-04-12 ENCOUNTER — Encounter (HOSPITAL_COMMUNITY): Payer: Medicare Other

## 2014-04-15 ENCOUNTER — Encounter (HOSPITAL_COMMUNITY): Payer: Medicare Other

## 2014-04-17 ENCOUNTER — Encounter (HOSPITAL_COMMUNITY): Payer: Medicare Other

## 2014-04-19 ENCOUNTER — Encounter (HOSPITAL_COMMUNITY): Payer: Medicare Other

## 2014-04-22 ENCOUNTER — Encounter (HOSPITAL_COMMUNITY): Payer: Medicare Other

## 2014-04-24 ENCOUNTER — Encounter (HOSPITAL_COMMUNITY): Payer: Medicare Other

## 2014-04-26 ENCOUNTER — Encounter (HOSPITAL_COMMUNITY): Payer: Medicare Other

## 2014-04-29 ENCOUNTER — Other Ambulatory Visit: Payer: Self-pay | Admitting: Family Medicine

## 2014-04-29 ENCOUNTER — Encounter (HOSPITAL_COMMUNITY): Payer: Medicare Other

## 2014-04-30 ENCOUNTER — Ambulatory Visit: Payer: Medicare Other | Admitting: Cardiology

## 2014-05-01 ENCOUNTER — Encounter (HOSPITAL_COMMUNITY): Payer: Medicare Other

## 2014-05-03 ENCOUNTER — Encounter (HOSPITAL_COMMUNITY): Payer: Medicare Other

## 2014-05-24 ENCOUNTER — Ambulatory Visit (INDEPENDENT_AMBULATORY_CARE_PROVIDER_SITE_OTHER): Payer: Medicare Other | Admitting: Cardiology

## 2014-05-24 ENCOUNTER — Encounter: Payer: Self-pay | Admitting: Cardiology

## 2014-05-24 VITALS — BP 128/70 | HR 86 | Ht 64.0 in | Wt 262.0 lb

## 2014-05-24 DIAGNOSIS — I2510 Atherosclerotic heart disease of native coronary artery without angina pectoris: Secondary | ICD-10-CM

## 2014-05-24 DIAGNOSIS — I219 Acute myocardial infarction, unspecified: Secondary | ICD-10-CM

## 2014-05-24 DIAGNOSIS — E785 Hyperlipidemia, unspecified: Secondary | ICD-10-CM

## 2014-05-24 DIAGNOSIS — I10 Essential (primary) hypertension: Secondary | ICD-10-CM
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
## 2014-05-24 NOTE — Patient Instructions (Signed)
Your physician wants you to follow-up in: 6 months You will receive a reminder letter in the mail two months in advance. If you don't receive a letter, please call our office to schedule the follow-up appointment.     Your physician recommends that you continue on your current medications as directed. Please refer to the Current Medication list given to you today.      Thank you for choosing [facility] !        

## 2014-06-04 ENCOUNTER — Encounter: Payer: Self-pay | Admitting: *Deleted

## 2014-06-04 LAB — HEMOGLOBIN A1C: A1c: 10.5

## 2014-06-10 ENCOUNTER — Other Ambulatory Visit: Payer: Self-pay | Admitting: Family Medicine

## 2014-06-18 ENCOUNTER — Encounter: Payer: Self-pay | Admitting: Family Medicine

## 2014-06-18 ENCOUNTER — Ambulatory Visit (INDEPENDENT_AMBULATORY_CARE_PROVIDER_SITE_OTHER): Payer: Medicare Other | Admitting: Family Medicine

## 2014-06-18 VITALS — BP 132/78 | Ht 64.0 in | Wt 259.0 lb

## 2014-06-18 DIAGNOSIS — J329 Chronic sinusitis, unspecified: Secondary | ICD-10-CM

## 2014-06-18 DIAGNOSIS — I213 ST elevation (STEMI) myocardial infarction of unspecified site: Secondary | ICD-10-CM

## 2014-06-18 DIAGNOSIS — J310 Chronic rhinitis: Secondary | ICD-10-CM

## 2014-06-18 MED ORDER — LEVOFLOXACIN 500 MG PO TABS: 500.0000 mg | ORAL_TABLET | Freq: Every day | ORAL | Status: AC

## 2014-06-18 NOTE — Progress Notes (Signed)
   Subjective:    Patient ID: [name], female    DOB: 1951/04/07, 63 y.o.   MRN: [id number]  HPI Patient has complaint of congestion & coughing green phlegm. Patient states she has a lot of congestion in her chest & it's hard for her to catch her breathe.  Sig cong  No wheezing  No fever  Frontal left sided headache zpk hasn't helpoed in the past  Review of Systems No chest pain no back pain no abdominal pain no pleuritic component no hemoptysis    Objective:   Physical Exam Alert mild malaise. Vital stable. H&T moderate his congestion yellowish discharge nose pharynx normal neck supple. Lungs faint wheeze no tachypnea no crackles heart rare rhythm.       Assessment & Plan:  Impression rhinosinusitis/bronchitis with reactive airways plan antibiotics prescribed. Symptomatic care discussed. Warning signs discussed. WSL

## 2014-08-22 ENCOUNTER — Encounter (HOSPITAL_COMMUNITY): Payer: Self-pay | Admitting: Cardiology

## 2014-09-20 ENCOUNTER — Ambulatory Visit (INDEPENDENT_AMBULATORY_CARE_PROVIDER_SITE_OTHER): Payer: Medicare Other | Admitting: Nurse Practitioner

## 2014-09-20 ENCOUNTER — Encounter: Payer: Self-pay | Admitting: Nurse Practitioner

## 2014-09-20 VITALS — BP 120/76 | Temp 98.4°F | Ht 64.0 in | Wt 252.0 lb

## 2014-09-20 DIAGNOSIS — L0291 Cutaneous abscess, unspecified: Secondary | ICD-10-CM

## 2014-09-20 DIAGNOSIS — J329 Chronic sinusitis, unspecified: Secondary | ICD-10-CM

## 2014-09-20 DIAGNOSIS — L0390 Cellulitis, unspecified: Secondary | ICD-10-CM

## 2014-09-20 MED ORDER — POTASSIUM CHLORIDE CRYS ER 20 MEQ PO TBCR: 20.0000 meq | EXTENDED_RELEASE_TABLET | Freq: Every day | ORAL | Status: DC

## 2014-09-20 MED ORDER — CLINDAMYCIN HCL 300 MG PO CAPS: 300.0000 mg | ORAL_CAPSULE | Freq: Three times a day (TID) | ORAL | Status: DC

## 2014-09-20 NOTE — Patient Instructions (Signed)
activia yogurt 2 cups per day OR Librarian, academic

## 2014-09-23 ENCOUNTER — Encounter: Payer: Self-pay | Admitting: Nurse Practitioner

## 2014-09-23 NOTE — Progress Notes (Signed)
Subjective:  Presents complaints of possible rectal bleeding that began about 3 days ago. There is a large amount of slightly pink drainage "flooding" in her pain eased 2 days ago, some soreness in the rectal area. No constipation. No blood with BMs. No fever. No abdominal pain. No previous history of hemorrhoids. Colonoscopy is up-to-date. Also complaints of cough over the past week. Producing green mucus over the past 48 hours. No wheezing. No headache. No sore throat or ear pain.  Objective:   BP 120/76 mmHg  Temp(Src) 98.4 F (36.9 C) (Oral)  Ht 5\' 4"  (1.626 m)  Wt 252 lb (114.306 kg)  BMI 43.23 kg/m2 NAD. Alert, oriented. TMs clear effusion, no erythema. Pharynx clear. Neck supple with mild soft anterior adenopathy. Lungs clear. Heart regular rhythm. Abdomen obese soft nondistended nontender. Rectal area a resolving abscess noted on the right buttock near the gluteal fold. Mildly erythematous, there is a small hole in the center. Approximately 3 cm area of firmness noted. Mildly tender to palpation. External rectal area clear, a small erythematous area noted just inside the anal canal.  Assessment: Cellulitis and abscess  Rhinosinusitis   Plan:  Meds ordered this encounter  Medications  . clindamycin (CLEOCIN) 300 MG capsule    Sig: Take 1 capsule (300 mg total) by mouth 3 (three) times daily.    Dispense:  30 capsule    Refill:  0    Order Specific Question:  Supervising Provider    Answer:  Merlyn Albert [[id number]]  . potassium chloride SA (KLOR-CON M20) 20 MEQ tablet    Sig: Take 1 tablet (20 mEq total) by mouth daily.    Dispense:  90 tablet    Refill:  1    *NEEDS OFFICE VISIT FOR FURTHER REFILLS*    Order Specific Question:  Supervising Provider    Answer:  Merlyn Albert [[id number]]   Pain and drainage plus likely related to abscess, unlikely to be related to rectal bleeding. Warm sitz baths. Warning signs reviewed. Call back in 72 hours if no improvement, go to ED over the  weekend if worse. Continue OTC meds as directed for congestion. Strongly recommend visit in the near future for diabetes checkup.

## 2014-11-06 ENCOUNTER — Ambulatory Visit: Payer: Medicare Other | Admitting: Gastroenterology

## 2014-11-21 ENCOUNTER — Encounter: Payer: Self-pay | Admitting: Nutrition

## 2014-11-21 ENCOUNTER — Encounter: Payer: Medicare Other | Attending: "Endocrinology | Admitting: Nutrition

## 2014-11-21 VITALS — Ht 64.0 in | Wt 277.0 lb

## 2014-11-21 DIAGNOSIS — IMO0002 Reserved for concepts with insufficient information to code with codable children: Secondary | ICD-10-CM

## 2014-11-21 DIAGNOSIS — Z713 Dietary counseling and surveillance: Secondary | ICD-10-CM | POA: Insufficient documentation

## 2014-11-21 DIAGNOSIS — E118 Type 2 diabetes mellitus with unspecified complications: Secondary | ICD-10-CM | POA: Diagnosis present

## 2014-11-21 DIAGNOSIS — Z794 Long term (current) use of insulin: Secondary | ICD-10-CM | POA: Diagnosis not present

## 2014-11-21 DIAGNOSIS — E1165 Type 2 diabetes mellitus with hyperglycemia: Secondary | ICD-10-CM

## 2014-11-21 DIAGNOSIS — Z6841 Body Mass Index (BMI) 40.0 and over, adult: Secondary | ICD-10-CM | POA: Diagnosis not present

## 2014-11-21 NOTE — Progress Notes (Signed)
  Medical Nutrition Therapy:  Appt start time: 1130 end time:  1230.  Assessment:  Primary concerns today: Diabetes. Lives with her husband. She and her husband do the shopping and cooking. She admits to bread being her comfort food. Testing blood sugars. FBS:150 mg/dl. 60 units of Lantus Novolog 10 units with meals plus sliding scale and Invokana 300 mg/d.  A1C was 14%. Will be starting to exercise more by walking outside.    Current diet is high in fat, sodium and CHO and low in fresh fruits, vegetables and whole grains. She is willing to make changes to improve blood sugars and lose weight.  Preferred Learning Style:   Auditory  Visual  Learning Readiness:    Ready  Change in progress  MEDICATIONS: See list   DIETARY INTAKE:  24-hr recall:  B ( AM): 2 eggs, cheese, piece of toast, decaf coffee Snk ( AM): banana or grapes L ( PM): chef salad from mayflower,unsweet tea, Snk ( PM): none D ( PM): [employer] rice bowl, unsweet tea Snk ( PM): yogurt Beverages: water, unsweet tea  Usual physical activity:  Walking some  Estimated energy needs: 1500 calories 170 g carbohydrates 112 g protein 42 g fat  Progress Towards Goal(s):  In progress.   Nutritional Diagnosis:  NB-1.1 Food and nutrition-related knowledge deficit As related to Diabetes.  As evidenced by A1C >14%.    Intervention:  Nutrition counseling and diabetes education provided on diet, medications, meal planning, complicaitons and My Plate, target blood sugars and treatment of hyper/hypoglycemia..  Goals:  Follow Diabetes Meal Plan as instructed  Eat 3 meals daily. Eat about the same time daily  Avoid snacks between meals.  Increase fresh fruits and vegetables.  Cut out fried and processed foods.  No cakes, cookies, sweets, pies, and junk food.  Limit carbohydrate intake to 30-45 grams carbohydrate/meal  Monitor glucose before meals and at bedtime.  Aim for 30 mins of physical activity  daily  Bring food record and glucose log to your next nutrition visit  Get A1C down to 9% in three months. Teaching Method Utilized:  Visual Auditory Hands on  Handouts given during visit include: The Plate Method Carb Counting and Food Label handouts Meal Plan Card  Barriers to learning/adherence to lifestyle change: none  Demonstrated degree of understanding via:  Teach Back   Monitoring/Evaluation:  Dietary intake, exercise, meal planning, and body weight in 1 month(s)m.

## 2014-11-27 NOTE — Patient Instructions (Addendum)
Goals:  Follow Diabetes Meal Plan as instructed  Eat 3 meals daily. Eat about the same time daily  Don't skip meals.  Avoid snacks between meals.  Increase fresh fruits and vegetables.  Cut out fried and processed foods.  No cakes, cookies, sweets, pies, and junk food.  Limit carbohydrate intake to 30-45 grams carbohydrate/meal  Monitor glucose before meals and at bedtime.  Aim for 30 mins of physical activity daily  Bring food record and glucose log to your next nutrition visit  Get A1C down to 9% in three months.

## 2014-11-28 ENCOUNTER — Ambulatory Visit (INDEPENDENT_AMBULATORY_CARE_PROVIDER_SITE_OTHER): Payer: Medicare Other | Admitting: Gastroenterology

## 2014-11-28 ENCOUNTER — Encounter: Payer: Self-pay | Admitting: Gastroenterology

## 2014-11-28 VITALS — BP 132/66 | HR 70 | Temp 97.6°F | Ht 63.0 in | Wt 259.4 lb

## 2014-11-28 DIAGNOSIS — K589 Irritable bowel syndrome without diarrhea: Secondary | ICD-10-CM

## 2014-11-28 DIAGNOSIS — K219 Gastro-esophageal reflux disease without esophagitis: Secondary | ICD-10-CM

## 2014-11-28 NOTE — Patient Instructions (Signed)
KEEP UP THE GOOD WORK. CONTINUE YOUR WEIGHT LOSS EFFORTS.  PLEASE CALL WITH QUESTIONS OR CONCERNS.  FOLLOW UP AS NEEDED.

## 2014-11-28 NOTE — Assessment & Plan Note (Signed)
SYMPTOMS CONTROLLED/RESOLVED.  CONTINUE TO MONITOR SYMPTOMS. OPV PRN 

## 2014-11-28 NOTE — Assessment & Plan Note (Addendum)
SYMPTOMS FAIRLY WELL CONTROLLED. INTENTIONAL WEIGHT LOSS. NO WARNING SIGNS/SYMPTOMS   CONTINUE TO MONITOR SYMPTOMS. OPV PRN.
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
## 2014-11-28 NOTE — Assessment & Plan Note (Signed)
SYMPTOMS FAIRLY WELL CONTROLLED ON PRN PROTONIX  CONTINUE YOUR WEIGHT LOSS EFFORTS. OPV PRN

## 2014-11-29 ENCOUNTER — Ambulatory Visit: Payer: Medicare Other | Admitting: Cardiology

## 2014-11-29 NOTE — Progress Notes (Signed)
cc'ed to pcp °

## 2014-12-03 ENCOUNTER — Ambulatory Visit (INDEPENDENT_AMBULATORY_CARE_PROVIDER_SITE_OTHER): Payer: Medicare Other | Admitting: Cardiology

## 2014-12-03 ENCOUNTER — Encounter: Payer: Self-pay | Admitting: *Deleted

## 2014-12-03 ENCOUNTER — Encounter: Payer: Self-pay | Admitting: Cardiology

## 2014-12-03 VITALS — BP 118/70 | HR 77 | Ht 63.0 in | Wt 263.0 lb

## 2014-12-03 DIAGNOSIS — I10 Essential (primary) hypertension: Secondary | ICD-10-CM | POA: Diagnosis not present

## 2014-12-03 DIAGNOSIS — E785 Hyperlipidemia, unspecified: Secondary | ICD-10-CM | POA: Diagnosis not present

## 2014-12-03 DIAGNOSIS — I2510 Atherosclerotic heart disease of native coronary artery without angina pectoris: Secondary | ICD-10-CM

## 2014-12-03 MED ORDER — ATORVASTATIN CALCIUM 80 MG PO TABS: 80.0000 mg | ORAL_TABLET | Freq: Every day | ORAL | Status: AC

## 2014-12-03 NOTE — Patient Instructions (Signed)
Your physician wants you to follow-up in: 6 MONTHS WITH DR. BRANCH You will receive a reminder letter in the mail two months in advance. If you don't receive a letter, please call our office to schedule the follow-up appointment.  Your physician has recommended you make the following change in your medication:   INCREASE ATORVASTATIN 80 MG DAILY  STOP TAKING PLAVIX  CONTINUE ALL OTHER MEDICATIONS AS DIRECTED  WE WILL REQUEST LAB RESULTS FROM DR. NYDIA   Thank you for choosing [facility]!!
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
## 2014-12-11 ENCOUNTER — Ambulatory Visit (INDEPENDENT_AMBULATORY_CARE_PROVIDER_SITE_OTHER): Payer: Medicare Other | Admitting: Family Medicine

## 2014-12-11 ENCOUNTER — Ambulatory Visit: Payer: Medicare Other | Admitting: Nurse Practitioner

## 2014-12-11 VITALS — BP 124/70 | Temp 98.5°F | Ht 64.0 in | Wt 265.0 lb

## 2014-12-11 DIAGNOSIS — I2510 Atherosclerotic heart disease of native coronary artery without angina pectoris: Secondary | ICD-10-CM | POA: Diagnosis not present

## 2014-12-11 DIAGNOSIS — J329 Chronic sinusitis, unspecified: Secondary | ICD-10-CM

## 2014-12-11 MED ORDER — AMOXICILLIN-POT CLAVULANATE 875-125 MG PO TABS: 1.0000 | ORAL_TABLET | Freq: Two times a day (BID) | ORAL | Status: DC

## 2014-12-11 MED ORDER — OFLOXACIN 0.3 % OT SOLN: OTIC | Status: DC

## 2014-12-11 NOTE — Progress Notes (Signed)
   Subjective:    Patient ID: [name], female    DOB: May 03, 1951, 64 y.o.   MRN: [id number]  Otalgia  There is pain in the right ear. This is a new problem. Episode onset: 3 days ago. Associated symptoms include coughing and headaches. Associated symptoms comments: Green nasal drainage. Treatments tried: ear drops, aleve.    Headache cp , frontal headache. Cough productive yellowish phlegm. Nasal discharge yellowish. No blood. Low-grade fever.  Right ear uncomfortable    Review of Systems  HENT: Positive for ear pain.   Respiratory: Positive for cough.   Neurological: Positive for headaches.       Objective:   Physical Exam   alert mild malaise vitals stable right external ear canal inflamed frontal maxillary tenderness pharynx slight erythema neck supple lungs clear heart regular in rhythm      Assessment & Plan:   impression right external otitis #2 rhinosinusitis plan antibiotics prescribed. Floxin drops. Local measures discussed WSL

## 2014-12-26 ENCOUNTER — Telehealth: Payer: Self-pay | Admitting: *Deleted

## 2014-12-26 MED ORDER — NITROGLYCERIN 0.4 MG SL SUBL: 0.4000 mg | SUBLINGUAL_TABLET | SUBLINGUAL | Status: AC | PRN

## 2014-12-26 NOTE — Telephone Encounter (Signed)
Pt called requesting refill for sub nitroglycerin, medication had expired since last refill. Confirmed pt pharmacy [facility]. Medication sent to pharmacy.

## 2015-01-06 ENCOUNTER — Telehealth: Payer: Self-pay | Admitting: Nutrition

## 2015-01-06 ENCOUNTER — Ambulatory Visit: Payer: Medicare Other | Admitting: Nutrition

## 2015-01-06 NOTE — Telephone Encounter (Signed)
Called and left message with her husband to have her call and reschedule her missed appointment. Her husband notes she had to take her nephew to the airport this am and couldn't make the appointment. Norm Salt, RDN, CDE

## 2015-02-04 ENCOUNTER — Ambulatory Visit (INDEPENDENT_AMBULATORY_CARE_PROVIDER_SITE_OTHER): Payer: Medicare Other | Admitting: Family Medicine

## 2015-02-04 ENCOUNTER — Encounter: Payer: Self-pay | Admitting: Family Medicine

## 2015-02-04 VITALS — BP 122/70 | Temp 98.5°F | Ht 64.0 in | Wt 266.0 lb

## 2015-02-04 DIAGNOSIS — I2510 Atherosclerotic heart disease of native coronary artery without angina pectoris: Secondary | ICD-10-CM

## 2015-02-04 DIAGNOSIS — J441 Chronic obstructive pulmonary disease with (acute) exacerbation: Secondary | ICD-10-CM

## 2015-02-04 DIAGNOSIS — G4700 Insomnia, unspecified: Secondary | ICD-10-CM | POA: Insufficient documentation

## 2015-02-04 DIAGNOSIS — I10 Essential (primary) hypertension: Secondary | ICD-10-CM | POA: Diagnosis not present

## 2015-02-04 MED ORDER — FLUTICASONE-SALMETEROL 250-50 MCG/DOSE IN AEPB: 1.0000 | INHALATION_SPRAY | Freq: Two times a day (BID) | RESPIRATORY_TRACT | Status: AC | PRN

## 2015-02-04 MED ORDER — PREDNISONE 10 MG PO TABS: ORAL_TABLET | ORAL | Status: DC

## 2015-02-04 MED ORDER — AMOXICILLIN-POT CLAVULANATE 875-125 MG PO TABS: 1.0000 | ORAL_TABLET | Freq: Two times a day (BID) | ORAL | Status: DC

## 2015-02-04 MED ORDER — ALBUTEROL SULFATE HFA 108 (90 BASE) MCG/ACT IN AERS: 3.0000 | INHALATION_SPRAY | RESPIRATORY_TRACT | Status: AC | PRN

## 2015-02-04 MED ORDER — ALPRAZOLAM 0.5 MG PO TABS: 0.5000 mg | ORAL_TABLET | Freq: Every evening | ORAL | Status: AC | PRN

## 2015-02-04 NOTE — Progress Notes (Signed)
   Subjective:    Patient ID: [name], female    DOB: Aug 03, 1951, 64 y.o.   MRN: [id number]  Cough This is a new problem. Episode onset: 5 days ago. Associated symptoms include a fever, headaches and a sore throat. Associated symptoms comments: Chest congestion - green. Treatments tried: inhaler.    Needs refill on advair and albuteral. States overall chronic asthma is stable. Has flared up somewhat with this infection. Claims compliance with Advair long-term. States it has definitely helped her.   Requesting alprazolam for sleep. Pt states she use to take this. It has been about 1 year ago. Pt states she only sleeps about 2 hours a night. Has used 0.5 mg a Presalin the past. States she definitely needs   Review of Systems  Constitutional: Positive for fever.  HENT: Positive for sore throat.   Respiratory: Positive for cough.   Neurological: Positive for headaches.       Objective:   Physical Exam Alert mild malaise. Vitals stable. HEENT moderate nasal congestion frontal tenderness pharynx normal neck supple. Lungs mild wheezes no tachypnea heart regular rhythm       Assessment & Plan:  Impression 1 insomnia discussed at length #2 chronic asthma with need for refills on medicine proper use discussed #3 rhinosinusitis/bronchitis with flare of #2 discussed plan Augmentin twice a day 10 days. Advair albuterol refilled. Xanax prescribed proper use discussed WSL

## 2015-03-05 ENCOUNTER — Other Ambulatory Visit: Payer: Self-pay | Admitting: Family Medicine

## 2015-03-05 NOTE — Telephone Encounter (Signed)
Ok plus five monthly ref 

## 2015-03-07 ENCOUNTER — Other Ambulatory Visit: Payer: Self-pay | Admitting: *Deleted

## 2015-03-07 MED ORDER — FUROSEMIDE 80 MG PO TABS: 80.0000 mg | ORAL_TABLET | Freq: Every day | ORAL | Status: DC

## 2015-04-04 ENCOUNTER — Other Ambulatory Visit: Payer: Self-pay | Admitting: *Deleted

## 2015-04-04 MED ORDER — LISINOPRIL 2.5 MG PO TABS: 2.5000 mg | ORAL_TABLET | Freq: Every day | ORAL | Status: DC

## 2015-04-25 ENCOUNTER — Other Ambulatory Visit: Payer: Self-pay | Admitting: Nurse Practitioner

## 2015-05-20 ENCOUNTER — Ambulatory Visit: Payer: Medicare Other | Admitting: Cardiology

## 2015-05-22 ENCOUNTER — Inpatient Hospital Stay (HOSPITAL_COMMUNITY)
Admission: EM | Admit: 2015-05-22 | Discharge: 2015-05-28 | DRG: 280 | Disposition: A | Discharge status: Home / Self Care | Payer: Medicare Other | Attending: Internal Medicine | Admitting: Internal Medicine

## 2015-05-22 ENCOUNTER — Emergency Department (HOSPITAL_COMMUNITY): Payer: Medicare Other

## 2015-05-22 ENCOUNTER — Encounter (HOSPITAL_COMMUNITY): Payer: Self-pay

## 2015-05-22 DIAGNOSIS — M545 Low back pain, unspecified: Secondary | ICD-10-CM | POA: Diagnosis present

## 2015-05-22 DIAGNOSIS — Z7982 Long term (current) use of aspirin: Secondary | ICD-10-CM

## 2015-05-22 DIAGNOSIS — I10 Essential (primary) hypertension: Secondary | ICD-10-CM | POA: Diagnosis present

## 2015-05-22 DIAGNOSIS — Z90710 Acquired absence of both cervix and uterus: Secondary | ICD-10-CM

## 2015-05-22 DIAGNOSIS — Z23 Encounter for immunization: Secondary | ICD-10-CM

## 2015-05-22 DIAGNOSIS — M1990 Unspecified osteoarthritis, unspecified site: Secondary | ICD-10-CM | POA: Diagnosis present

## 2015-05-22 DIAGNOSIS — F329 Major depressive disorder, single episode, unspecified: Secondary | ICD-10-CM | POA: Diagnosis present

## 2015-05-22 DIAGNOSIS — I25700 Atherosclerosis of coronary artery bypass graft(s), unspecified, with unstable angina pectoris: Secondary | ICD-10-CM

## 2015-05-22 DIAGNOSIS — M25519 Pain in unspecified shoulder: Secondary | ICD-10-CM | POA: Diagnosis not present

## 2015-05-22 DIAGNOSIS — I5021 Acute systolic (congestive) heart failure: Secondary | ICD-10-CM | POA: Diagnosis present

## 2015-05-22 DIAGNOSIS — F4321 Adjustment disorder with depressed mood: Secondary | ICD-10-CM | POA: Insufficient documentation

## 2015-05-22 DIAGNOSIS — E785 Hyperlipidemia, unspecified: Secondary | ICD-10-CM | POA: Diagnosis not present

## 2015-05-22 DIAGNOSIS — H409 Unspecified glaucoma: Secondary | ICD-10-CM | POA: Diagnosis present

## 2015-05-22 DIAGNOSIS — R05 Cough: Secondary | ICD-10-CM | POA: Diagnosis not present

## 2015-05-22 DIAGNOSIS — IMO0002 Reserved for concepts with insufficient information to code with codable children: Secondary | ICD-10-CM | POA: Diagnosis present

## 2015-05-22 DIAGNOSIS — E039 Hypothyroidism, unspecified: Secondary | ICD-10-CM | POA: Diagnosis present

## 2015-05-22 DIAGNOSIS — Z9049 Acquired absence of other specified parts of digestive tract: Secondary | ICD-10-CM | POA: Diagnosis present

## 2015-05-22 DIAGNOSIS — I447 Left bundle-branch block, unspecified: Secondary | ICD-10-CM | POA: Diagnosis present

## 2015-05-22 DIAGNOSIS — Z6841 Body Mass Index (BMI) 40.0 and over, adult: Secondary | ICD-10-CM | POA: Diagnosis not present

## 2015-05-22 DIAGNOSIS — I509 Heart failure, unspecified: Secondary | ICD-10-CM | POA: Diagnosis not present

## 2015-05-22 DIAGNOSIS — G8929 Other chronic pain: Secondary | ICD-10-CM | POA: Diagnosis present

## 2015-05-22 DIAGNOSIS — Z951 Presence of aortocoronary bypass graft: Secondary | ICD-10-CM

## 2015-05-22 DIAGNOSIS — I2510 Atherosclerotic heart disease of native coronary artery without angina pectoris: Secondary | ICD-10-CM | POA: Diagnosis present

## 2015-05-22 DIAGNOSIS — Z794 Long term (current) use of insulin: Secondary | ICD-10-CM

## 2015-05-22 DIAGNOSIS — Z955 Presence of coronary angioplasty implant and graft: Secondary | ICD-10-CM | POA: Diagnosis not present

## 2015-05-22 DIAGNOSIS — K219 Gastro-esophageal reflux disease without esophagitis: Secondary | ICD-10-CM | POA: Diagnosis not present

## 2015-05-22 DIAGNOSIS — E1165 Type 2 diabetes mellitus with hyperglycemia: Secondary | ICD-10-CM | POA: Diagnosis not present

## 2015-05-22 DIAGNOSIS — Z79899 Other long term (current) drug therapy: Secondary | ICD-10-CM

## 2015-05-22 DIAGNOSIS — F419 Anxiety disorder, unspecified: Secondary | ICD-10-CM | POA: Diagnosis present

## 2015-05-22 DIAGNOSIS — I214 Non-ST elevation (NSTEMI) myocardial infarction: Secondary | ICD-10-CM | POA: Diagnosis not present

## 2015-05-22 DIAGNOSIS — K580 Irritable bowel syndrome with diarrhea: Secondary | ICD-10-CM | POA: Diagnosis present

## 2015-05-22 DIAGNOSIS — R059 Cough, unspecified: Secondary | ICD-10-CM

## 2015-05-22 LAB — CBC
HCT: 38.8 % (ref 36.0–46.0)
HEMOGLOBIN: 13.5 g/dL (ref 12.0–15.0)
MCH: 31.3 pg (ref 26.0–34.0)
MCHC: 34.8 g/dL (ref 30.0–36.0)
MCV: 90 fL (ref 78.0–100.0)
PLATELETS: 213 10*3/uL (ref 150–400)
RBC: 4.31 MIL/uL (ref 3.87–5.11)
RDW: 12.8 % (ref 11.5–15.5)
WBC: 10.4 10*3/uL (ref 4.0–10.5)

## 2015-05-22 LAB — TROPONIN I: TROPONIN I: 1.09 ng/mL — AB (ref <0.031)

## 2015-05-22 LAB — BASIC METABOLIC PANEL
Anion gap: 17 — ABNORMAL HIGH (ref 5–15)
BUN: 15 mg/dL (ref 6–20)
CHLORIDE: 92 mmol/L — AB (ref 101–111)
CO2: 22 mmol/L (ref 22–32)
CREATININE: 1 mg/dL (ref 0.44–1.00)
Calcium: 9.3 mg/dL (ref 8.9–10.3)
GFR calc non Af Amer: 59 mL/min — ABNORMAL LOW (ref >60)
GLUCOSE: 559 mg/dL — AB (ref 65–99)
Potassium: 4.8 mmol/L (ref 3.5–5.1)
Sodium: 131 mmol/L — ABNORMAL LOW (ref 135–145)

## 2015-05-22 LAB — URINALYSIS, ROUTINE W REFLEX MICROSCOPIC
Bilirubin Urine: NEGATIVE
Hgb urine dipstick: NEGATIVE
Ketones, ur: 15 mg/dL — AB
LEUKOCYTES UA: NEGATIVE
Nitrite: NEGATIVE
PH: 6 (ref 5.0–8.0)
Protein, ur: NEGATIVE mg/dL
Specific Gravity, Urine: 1.018 (ref 1.005–1.030)
Urobilinogen, UA: 0.2 mg/dL (ref 0.0–1.0)

## 2015-05-22 LAB — GLUCOSE, CAPILLARY
Glucose-Capillary: 469 mg/dL — ABNORMAL HIGH (ref 65–99)
Glucose-Capillary: 414 mg/dL — ABNORMAL HIGH (ref 65–99)

## 2015-05-22 LAB — I-STAT TROPONIN, ED: Troponin i, poc: 1.17 ng/mL (ref 0.00–0.08)

## 2015-05-22 LAB — URINE MICROSCOPIC-ADD ON

## 2015-05-22 LAB — MRSA PCR SCREENING: MRSA by PCR: NEGATIVE

## 2015-05-22 LAB — CBG MONITORING, ED: Glucose-Capillary: 547 mg/dL — ABNORMAL HIGH (ref 65–99)

## 2015-05-22 MED ORDER — SODIUM CHLORIDE 0.9 % IV SOLN
1000.0000 mL | Freq: Once | INTRAVENOUS | Status: AC
  Administered 2015-05-22: 1000 mL via INTRAVENOUS

## 2015-05-22 MED ORDER — METOPROLOL SUCCINATE ER 25 MG PO TB24: 25.0000 mg | ORAL_TABLET | Freq: Two times a day (BID) | ORAL | Status: DC

## 2015-05-22 MED ORDER — NITROGLYCERIN IN D5W 200-5 MCG/ML-% IV SOLN: 0.0000 ug/min | INTRAVENOUS | Status: DC

## 2015-05-22 MED ORDER — INSULIN REGULAR BOLUS VIA INFUSION
0.0000 [IU] | Freq: Three times a day (TID) | INTRAVENOUS | Status: DC
  Filled 2015-05-22: qty 10

## 2015-05-22 MED ORDER — HEPARIN BOLUS VIA INFUSION
4000.0000 [IU] | Freq: Once | INTRAVENOUS | Status: AC
  Administered 2015-05-22: 4000 [IU] via INTRAVENOUS
  Filled 2015-05-22: qty 4000

## 2015-05-22 MED ORDER — DEXTROSE-NACL 5-0.45 % IV SOLN: INTRAVENOUS | Status: DC

## 2015-05-22 MED ORDER — SODIUM CHLORIDE 0.9 % IV BOLUS (SEPSIS)
1000.0000 mL | Freq: Once | INTRAVENOUS | Status: AC
  Administered 2015-05-22: 1000 mL via INTRAVENOUS

## 2015-05-22 MED ORDER — SODIUM CHLORIDE 0.9 % IV SOLN: 250.0000 mL | INTRAVENOUS | Status: DC | PRN

## 2015-05-22 MED ORDER — ALBUTEROL SULFATE (2.5 MG/3ML) 0.083% IN NEBU: 2.5000 mg | INHALATION_SOLUTION | RESPIRATORY_TRACT | Status: DC | PRN

## 2015-05-22 MED ORDER — ATORVASTATIN CALCIUM 80 MG PO TABS
80.0000 mg | ORAL_TABLET | Freq: Every day | ORAL | Status: DC
  Administered 2015-05-22 – 2015-05-28 (×7): 80 mg via ORAL
  Filled 2015-05-22 (×7): qty 1

## 2015-05-22 MED ORDER — ACETAMINOPHEN 325 MG PO TABS
650.0000 mg | ORAL_TABLET | ORAL | Status: DC | PRN
  Administered 2015-05-23 – 2015-05-28 (×3): 650 mg via ORAL
  Filled 2015-05-22 (×3): qty 2

## 2015-05-22 MED ORDER — DEXTROSE-NACL 5-0.45 % IV SOLN
INTRAVENOUS | Status: DC
  Administered 2015-05-23: 03:00:00 via INTRAVENOUS

## 2015-05-22 MED ORDER — ASPIRIN 81 MG PO CHEW: 324.0000 mg | CHEWABLE_TABLET | ORAL | Status: AC

## 2015-05-22 MED ORDER — NITROGLYCERIN 0.4 MG SL SUBL
0.4000 mg | SUBLINGUAL_TABLET | SUBLINGUAL | Status: DC | PRN
  Administered 2015-05-25: 0.4 mg via SUBLINGUAL
  Filled 2015-05-22: qty 1

## 2015-05-22 MED ORDER — PANTOPRAZOLE SODIUM 40 MG PO TBEC
40.0000 mg | DELAYED_RELEASE_TABLET | Freq: Every day | ORAL | Status: DC | PRN
Start: 2015-05-22 — End: 2015-05-28

## 2015-05-22 MED ORDER — SODIUM CHLORIDE 0.9 % IV SOLN
1000.0000 mL | INTRAVENOUS | Status: DC
  Administered 2015-05-22: 1000 mL via INTRAVENOUS

## 2015-05-22 MED ORDER — LISINOPRIL 2.5 MG PO TABS: 2.5000 mg | ORAL_TABLET | Freq: Every day | ORAL | Status: DC

## 2015-05-22 MED ORDER — NITROGLYCERIN IN D5W 200-5 MCG/ML-% IV SOLN
0.0000 ug/min | Freq: Once | INTRAVENOUS | Status: AC
  Administered 2015-05-22: 5 ug/min via INTRAVENOUS
  Filled 2015-05-22: qty 250

## 2015-05-22 MED ORDER — SODIUM CHLORIDE 0.9 % IV SOLN
INTRAVENOUS | Status: DC
  Administered 2015-05-23: 06:00:00 via INTRAVENOUS

## 2015-05-22 MED ORDER — SODIUM CHLORIDE 0.9 % IJ SOLN: 3.0000 mL | INTRAMUSCULAR | Status: DC | PRN

## 2015-05-22 MED ORDER — ONDANSETRON HCL 4 MG/2ML IJ SOLN
4.0000 mg | Freq: Four times a day (QID) | INTRAMUSCULAR | Status: DC | PRN
  Administered 2015-05-24: 4 mg via INTRAVENOUS
  Filled 2015-05-22: qty 2

## 2015-05-22 MED ORDER — INSULIN REGULAR HUMAN 100 UNIT/ML IJ SOLN
INTRAMUSCULAR | Status: DC
  Filled 2015-05-22: qty 2.5

## 2015-05-22 MED ORDER — MOMETASONE FURO-FORMOTEROL FUM 100-5 MCG/ACT IN AERO
2.0000 | INHALATION_SPRAY | Freq: Two times a day (BID) | RESPIRATORY_TRACT | Status: DC
  Administered 2015-05-23 – 2015-05-28 (×6): 2 via RESPIRATORY_TRACT
  Filled 2015-05-22 (×2): qty 8.8

## 2015-05-22 MED ORDER — ASPIRIN EC 81 MG PO TBEC
81.0000 mg | DELAYED_RELEASE_TABLET | Freq: Every day | ORAL | Status: DC
  Administered 2015-05-24 – 2015-05-28 (×5): 81 mg via ORAL
  Filled 2015-05-22 (×5): qty 1

## 2015-05-22 MED ORDER — ASPIRIN 81 MG PO CHEW
81.0000 mg | CHEWABLE_TABLET | ORAL | Status: AC
  Administered 2015-05-23: 81 mg via ORAL
  Filled 2015-05-22: qty 1

## 2015-05-22 MED ORDER — SODIUM CHLORIDE 0.9 % IJ SOLN: 3.0000 mL | Freq: Two times a day (BID) | INTRAMUSCULAR | Status: DC

## 2015-05-22 MED ORDER — HEPARIN (PORCINE) IN NACL 100-0.45 UNIT/ML-% IJ SOLN
1100.0000 [IU]/h | INTRAMUSCULAR | Status: DC
  Administered 2015-05-22: 850 [IU]/h via INTRAVENOUS
  Filled 2015-05-22: qty 250

## 2015-05-22 MED ORDER — DEXTROSE 50 % IV SOLN: 25.0000 mL | INTRAVENOUS | Status: DC | PRN

## 2015-05-22 MED ORDER — SODIUM CHLORIDE 0.9 % IV SOLN: INTRAVENOUS | Status: DC

## 2015-05-22 MED ORDER — LEVOTHYROXINE SODIUM 100 MCG PO TABS
125.0000 ug | ORAL_TABLET | Freq: Every day | ORAL | Status: DC
  Administered 2015-05-23 – 2015-05-25 (×3): 125 ug via ORAL
  Filled 2015-05-22 (×4): qty 1

## 2015-05-22 MED ORDER — SODIUM CHLORIDE 0.9 % IV SOLN
INTRAVENOUS | Status: DC
  Administered 2015-05-22: 3.5 [IU]/h via INTRAVENOUS
  Filled 2015-05-22: qty 2.5

## 2015-05-22 MED ORDER — FUROSEMIDE 20 MG PO TABS: 80.0000 mg | ORAL_TABLET | Freq: Every day | ORAL | Status: DC

## 2015-05-22 MED ORDER — ALPRAZOLAM 0.5 MG PO TABS: 0.5000 mg | ORAL_TABLET | Freq: Every evening | ORAL | Status: DC | PRN

## 2015-05-22 MED ORDER — LATANOPROST 0.005 % OP SOLN
1.0000 [drp] | Freq: Every day | OPHTHALMIC | Status: DC
  Administered 2015-05-22 – 2015-05-27 (×5): 1 [drp] via OPHTHALMIC
  Filled 2015-05-22 (×2): qty 2.5

## 2015-05-22 MED ORDER — SODIUM CHLORIDE 0.9 % WEIGHT BASED INFUSION: 1.0000 mL/kg/h | INTRAVENOUS | Status: DC

## 2015-05-22 MED ORDER — ASPIRIN EC 81 MG PO TBEC: 81.0000 mg | DELAYED_RELEASE_TABLET | Freq: Every day | ORAL | Status: DC

## 2015-05-22 MED ORDER — ALBUTEROL SULFATE HFA 108 (90 BASE) MCG/ACT IN AERS: 3.0000 | INHALATION_SPRAY | RESPIRATORY_TRACT | Status: DC | PRN

## 2015-05-22 MED ORDER — ASPIRIN 300 MG RE SUPP: 300.0000 mg | RECTAL | Status: AC

## 2015-05-22 NOTE — ED Notes (Signed)
Karla Chick, MD notified of abnormal lab test results; Jacubowitz, MD present in room with patient at this time

## 2015-05-22 NOTE — ED Notes (Signed)
Attempted report X1
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
## 2015-05-22 NOTE — Consult Note (Signed)
PHARMACY NOTE  Consult :   Heparin Indication :   ACS  Heparin Dosing Wt :  70 kg  LABS :  Recent Labs  05/22/15 1841  HGB 13.5  HCT 38.8  PLT 213  CREATININE 1.00   MEDICATION: Medication PTA: Not on anticoagulation Scheduled:  Scheduled:  . aspirin  324 mg Oral NOW   Or  . aspirin  300 mg Rectal NOW  . [START ON 05/23/2015] aspirin EC  81 mg Oral Daily  . atorvastatin  80 mg Oral Daily  . heparin  4,000 Units Intravenous Once  . [START ON 05/23/2015] insulin regular  0-10 Units Intravenous TID WC  . latanoprost  1 drop Both Eyes QHS  . [START ON 05/23/2015] levothyroxine  125 mcg Oral QAC breakfast  . mometasone-formoterol  2 puff Inhalation BID   Infusion[s]: Infusions:  . sodium chloride    . sodium chloride    . dextrose 5 % and 0.45% NaCl    . dextrose 5 % and 0.45% NaCl    . heparin 850 Units/hr (05/22/15 2053)  . insulin (NOVOLIN-R) infusion     Antibiotic[s]: Anti-infectives    None      ASSESSMENT :  64 y.o. female with history of CAD s/p CABG is admitted with chest pain.  Heparin to be started for ACS/STEMI.  GOAL :  Heparin Level  0.3 - 0.7 units/ml  PLAN : 1. Heparin bolus 4000 units IV now, then continue Heparin infusion at 850 units/hr.   2. Daily Heparin level, CBC while on Heparin.  Monitor for bleeding complications. Follow Platelet counts.  Velda Shell,  Pharm.D   05/22/2015,  8:58 PM
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
## 2015-05-22 NOTE — ED Notes (Signed)
Pt brought in EMS for substernal chest pain that began at 1400 this afternoon.  Pt took 4 nitro at home with no relief.  Pt was given 324mg  ASA, 4mg  Morphine and one additional nitro PTA and now rates pain a 5/10.  Pt denies n/v/d  But reports SOB.  Pt has MI hx with stent placement.
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
## 2015-05-23 ENCOUNTER — Encounter (HOSPITAL_COMMUNITY)
Admission: EM | Disposition: A | Discharge status: Home / Self Care | Payer: Self-pay | Source: Home / Self Care | Attending: Internal Medicine

## 2015-05-23 ENCOUNTER — Encounter: Payer: Medicare Other | Admitting: Cardiology

## 2015-05-23 DIAGNOSIS — I2510 Atherosclerotic heart disease of native coronary artery without angina pectoris: Secondary | ICD-10-CM

## 2015-05-23 DIAGNOSIS — K219 Gastro-esophageal reflux disease without esophagitis: Secondary | ICD-10-CM

## 2015-05-23 DIAGNOSIS — I214 Non-ST elevation (NSTEMI) myocardial infarction: Secondary | ICD-10-CM | POA: Diagnosis not present

## 2015-05-23 HISTORY — PX: CARDIAC CATHETERIZATION: SHX172

## 2015-05-23 LAB — GLUCOSE, CAPILLARY
GLUCOSE-CAPILLARY: 152 mg/dL — AB (ref 65–99)
GLUCOSE-CAPILLARY: 154 mg/dL — AB (ref 65–99)
GLUCOSE-CAPILLARY: 163 mg/dL — AB (ref 65–99)
GLUCOSE-CAPILLARY: 228 mg/dL — AB (ref 65–99)
Glucose-Capillary: 267 mg/dL — ABNORMAL HIGH (ref 65–99)
Glucose-Capillary: 291 mg/dL — ABNORMAL HIGH (ref 65–99)
Glucose-Capillary: 374 mg/dL — ABNORMAL HIGH (ref 65–99)
Glucose-Capillary: 137 mg/dL — ABNORMAL HIGH (ref 65–99)
Glucose-Capillary: 184 mg/dL — ABNORMAL HIGH (ref 65–99)
Glucose-Capillary: 240 mg/dL — ABNORMAL HIGH (ref 65–99)

## 2015-05-23 LAB — CBC
HEMATOCRIT: 35.6 % — AB (ref 36.0–46.0)
HEMOGLOBIN: 12.1 g/dL (ref 12.0–15.0)
MCH: 31.2 pg (ref 26.0–34.0)
MCHC: 34 g/dL (ref 30.0–36.0)
MCV: 91.8 fL (ref 78.0–100.0)
PLATELETS: 178 10*3/uL (ref 150–400)
RBC: 3.88 MIL/uL (ref 3.87–5.11)
RDW: 13.2 % (ref 11.5–15.5)
WBC: 9.6 10*3/uL (ref 4.0–10.5)

## 2015-05-23 LAB — BASIC METABOLIC PANEL
Anion gap: 10 (ref 5–15)
BUN: 11 mg/dL (ref 6–20)
CO2: 23 mmol/L (ref 22–32)
Calcium: 8.3 mg/dL — ABNORMAL LOW (ref 8.9–10.3)
Chloride: 104 mmol/L (ref 101–111)
Creatinine, Ser: 0.6 mg/dL (ref 0.44–1.00)
GFR calc Af Amer: >60 mL/min (ref >60)
GFR calc non Af Amer: >60 mL/min (ref >60)
Glucose, Bld: 153 mg/dL — ABNORMAL HIGH (ref 65–99)
Potassium: 3.8 mmol/L (ref 3.5–5.1)
Sodium: 137 mmol/L (ref 135–145)

## 2015-05-23 LAB — PROTIME-INR
INR: 1.26 (ref 0.00–1.49)
Prothrombin Time: 16 seconds — ABNORMAL HIGH (ref 11.6–15.2)

## 2015-05-23 LAB — TROPONIN I
Troponin I: 2.33 ng/mL (ref <0.031)
Troponin I: 2.83 ng/mL (ref <0.031)

## 2015-05-23 LAB — HEPARIN LEVEL (UNFRACTIONATED)
Heparin Unfractionated: 0.19 IU/mL — ABNORMAL LOW (ref 0.30–0.70)
Heparin Unfractionated: 0.32 IU/mL (ref 0.30–0.70)

## 2015-05-23 LAB — POCT ACTIVATED CLOTTING TIME: Activated Clotting Time: 104 seconds

## 2015-05-23 SURGERY — LEFT HEART CATH AND CORS/GRAFTS ANGIOGRAPHY
Anesthesia: LOCAL

## 2015-05-23 MED ORDER — ISOSORBIDE MONONITRATE ER 60 MG PO TB24
60.0000 mg | ORAL_TABLET | Freq: Every day | ORAL | Status: DC
  Administered 2015-05-23 – 2015-05-25 (×3): 60 mg via ORAL
  Filled 2015-05-23 (×3): qty 1

## 2015-05-23 MED ORDER — MIDAZOLAM HCL 2 MG/2ML IJ SOLN
INTRAMUSCULAR | Status: DC | PRN
  Administered 2015-05-23: 2 mg via INTRAVENOUS
  Administered 2015-05-23: 1 mg via INTRAVENOUS

## 2015-05-23 MED ORDER — FUROSEMIDE 10 MG/ML IJ SOLN
INTRAMUSCULAR | Status: DC | PRN
  Administered 2015-05-23: 40 mg via INTRAVENOUS

## 2015-05-23 MED ORDER — SODIUM CHLORIDE 0.9 % IJ SOLN
3.0000 mL | Freq: Two times a day (BID) | INTRAMUSCULAR | Status: DC
  Administered 2015-05-23 – 2015-05-27 (×7): 3 mL via INTRAVENOUS

## 2015-05-23 MED ORDER — SODIUM CHLORIDE 0.9 % IJ SOLN: 3.0000 mL | INTRAMUSCULAR | Status: DC | PRN

## 2015-05-23 MED ORDER — HEPARIN BOLUS VIA INFUSION
2000.0000 [IU] | Freq: Once | INTRAVENOUS | Status: AC
  Administered 2015-05-23: 2000 [IU] via INTRAVENOUS
  Filled 2015-05-23: qty 2000

## 2015-05-23 MED ORDER — INSULIN GLARGINE 100 UNIT/ML ~~LOC~~ SOLN
30.0000 [IU] | Freq: Every day | SUBCUTANEOUS | Status: DC
  Administered 2015-05-23: 30 [IU] via SUBCUTANEOUS
  Filled 2015-05-23 (×2): qty 0.3

## 2015-05-23 MED ORDER — SODIUM CHLORIDE 0.9 % IV SOLN
1000.0000 mL | INTRAVENOUS | Status: DC
Start: 2015-05-23 — End: 2015-05-23

## 2015-05-23 MED ORDER — FENTANYL CITRATE (PF) 100 MCG/2ML IJ SOLN
INTRAMUSCULAR | Status: AC
  Filled 2015-05-23: qty 4

## 2015-05-23 MED ORDER — HEPARIN SODIUM (PORCINE) 5000 UNIT/ML IJ SOLN
5000.0000 [IU] | Freq: Three times a day (TID) | INTRAMUSCULAR | Status: DC
  Administered 2015-05-24 – 2015-05-28 (×13): 5000 [IU] via SUBCUTANEOUS
  Filled 2015-05-23 (×12): qty 1

## 2015-05-23 MED ORDER — INSULIN ASPART 100 UNIT/ML ~~LOC~~ SOLN
0.0000 [IU] | SUBCUTANEOUS | Status: DC
  Administered 2015-05-23: 3 [IU] via SUBCUTANEOUS
  Administered 2015-05-23 – 2015-05-24 (×3): 5 [IU] via SUBCUTANEOUS
  Administered 2015-05-24: 11 [IU] via SUBCUTANEOUS
  Administered 2015-05-24 (×2): 8 [IU] via SUBCUTANEOUS
  Administered 2015-05-24: 3 [IU] via SUBCUTANEOUS

## 2015-05-23 MED ORDER — LIDOCAINE HCL (PF) 1 % IJ SOLN
INTRAMUSCULAR | Status: AC
  Filled 2015-05-23: qty 30

## 2015-05-23 MED ORDER — MIDAZOLAM HCL 2 MG/2ML IJ SOLN
INTRAMUSCULAR | Status: AC
  Filled 2015-05-23: qty 4

## 2015-05-23 MED ORDER — FUROSEMIDE 10 MG/ML IJ SOLN
INTRAMUSCULAR | Status: AC
  Filled 2015-05-23: qty 4

## 2015-05-23 MED ORDER — FENTANYL CITRATE (PF) 100 MCG/2ML IJ SOLN
INTRAMUSCULAR | Status: DC | PRN
  Administered 2015-05-23 (×3): 25 ug via INTRAVENOUS

## 2015-05-23 MED ORDER — LIDOCAINE HCL (PF) 1 % IJ SOLN
INTRAMUSCULAR | Status: DC | PRN
  Administered 2015-05-23: 16:00:00

## 2015-05-23 MED ORDER — LORAZEPAM 1 MG PO TABS
1.0000 mg | ORAL_TABLET | Freq: Three times a day (TID) | ORAL | Status: DC | PRN
  Administered 2015-05-23 (×2): 1 mg via ORAL
  Filled 2015-05-23 (×3): qty 1

## 2015-05-23 MED ORDER — SODIUM CHLORIDE 0.9 % IV SOLN: 250.0000 mL | INTRAVENOUS | Status: DC | PRN

## 2015-05-23 MED ORDER — FUROSEMIDE 10 MG/ML IJ SOLN
40.0000 mg | Freq: Two times a day (BID) | INTRAMUSCULAR | Status: DC
  Administered 2015-05-23 – 2015-05-24 (×3): 40 mg via INTRAVENOUS
  Filled 2015-05-23 (×3): qty 4

## 2015-05-23 MED ORDER — INFLUENZA VAC SPLIT QUAD 0.5 ML IM SUSY
0.5000 mL | PREFILLED_SYRINGE | Freq: Once | INTRAMUSCULAR | Status: AC
  Administered 2015-05-23: 0.5 mL via INTRAMUSCULAR
  Filled 2015-05-23: qty 0.5

## 2015-05-23 MED ORDER — HEPARIN (PORCINE) IN NACL 2-0.9 UNIT/ML-% IJ SOLN
INTRAMUSCULAR | Status: AC
  Filled 2015-05-23: qty 1000

## 2015-05-23 MED ORDER — IOHEXOL 350 MG/ML SOLN
INTRAVENOUS | Status: DC | PRN
  Administered 2015-05-23: 75 mL via INTRA_ARTERIAL

## 2015-05-23 MED ORDER — CETYLPYRIDINIUM CHLORIDE 0.05 % MT LIQD
7.0000 mL | Freq: Two times a day (BID) | OROMUCOSAL | Status: DC
  Administered 2015-05-23 – 2015-05-27 (×7): 7 mL via OROMUCOSAL

## 2015-05-23 MED ORDER — SODIUM CHLORIDE 0.9 % IJ SOLN: 3.0000 mL | Freq: Two times a day (BID) | INTRAMUSCULAR | Status: DC

## 2015-05-23 SURGICAL SUPPLY — 11 items
CATH INFINITI 5 FR LCB (CATHETERS) ×2 IMPLANT
CATH INFINITI 5 FR MPA2 (CATHETERS) ×2 IMPLANT
CATH INFINITI 5FR MULTPACK ANG (CATHETERS) ×2 IMPLANT
HOVERMATT SINGLE USE (MISCELLANEOUS) ×2 IMPLANT
KIT HEART LEFT (KITS) ×2 IMPLANT
NEEDLE SMART 18GX3.5CM LONG (NEEDLE) ×2 IMPLANT
PACK CARDIAC CATHETERIZATION (CUSTOM PROCEDURE TRAY) ×2 IMPLANT
SHEATH PINNACLE 5F 10CM (SHEATH) ×2 IMPLANT
SYR MEDRAD MARK V 150ML (SYRINGE) ×2 IMPLANT
TRANSDUCER W/STOPCOCK (MISCELLANEOUS) ×2 IMPLANT
WIRE EMERALD 3MM-J .035X150CM (WIRE) ×2 IMPLANT

## 2015-05-23 NOTE — Progress Notes (Signed)
Patient Name: [name] Date of Encounter: 05/23/2015  Principal Problem:   NSTEMI (non-ST elevated myocardial infarction) Active Problems:   CAD- CABG '03, DES to S-PDA, S-Dx 2/13, S-PDA 12/13/13   Type 2 diabetes mellitus, uncontrolled   Hypothyroidism   Hyperlipidemia   Hypertension   Morbid obesity   Gastroesophageal reflux disease   Chronic low back pain   Length of Stay: 1  SUBJECTIVE  No angina or dyspnea. (Chronic) back pain. Very emotional about her husband's recent passing, just one month ago.  CURRENT MEDS . antiseptic oral rinse  7 mL Mouth Rinse BID  . aspirin  324 mg Oral NOW   Or  . aspirin  300 mg Rectal NOW  . [START ON 05/24/2015] aspirin EC  81 mg Oral Daily  . atorvastatin  80 mg Oral Daily  . Influenza vac split quadrivalent PF  0.5 mL Intramuscular Once  . insulin aspart  0-15 Units Subcutaneous 6 times per day  . insulin glargine  30 Units Subcutaneous Daily  . insulin regular  0-10 Units Intravenous TID WC  . latanoprost  1 drop Both Eyes QHS  . levothyroxine  125 mcg Oral QAC breakfast  . mometasone-formoterol  2 puff Inhalation BID  . sodium chloride  3 mL Intravenous Q12H    OBJECTIVE   Intake/Output Summary (Last 24 hours) at 05/23/15 0940 Last data filed at 05/23/15 0800  Gross per 24 hour  Intake 1722.78 ml  Output   1100 ml  Net 622.78 ml   Filed Weights   05/22/15 1814 05/22/15 2100  Weight: 180 lb (81.647 kg) 273 lb 5.9 oz (124 kg)    PHYSICAL EXAM Filed Vitals:   05/23/15 0700 05/23/15 0755 05/23/15 0800 05/23/15 0822  BP: 89/48     Pulse: 77  96   Temp:  97.8 F (36.6 C)    TempSrc:  Oral    Resp: 22  20   Height:      Weight:      SpO2: 98%  97% 95%   General: Alert, oriented x3, no distress, obese Head: no evidence of trauma, PERRL, EOMI, no exophtalmos or lid lag, no myxedema, no xanthelasma; normal ears, nose and oropharynx Neck: normal jugular venous pulsations and no hepatojugular reflux; brisk carotid  pulses without delay and no carotid bruits Chest: clear to auscultation, no signs of consolidation by percussion or palpation, normal fremitus, symmetrical and full respiratory excursions Cardiovascular: normal position and quality of the apical impulse, regular rhythm, normal first and paradoxically split second heart sounds, no rubs, has S4 gallop, no murmur Abdomen: no tenderness or distention, no masses by palpation, no abnormal pulsatility or arterial bruits, normal bowel sounds, no hepatosplenomegaly Extremities: no clubbing, cyanosis or edema; 2+ radial, ulnar and brachial pulses bilaterally; 2+ right femoral, posterior tibial and dorsalis pedis pulses; 2+ left femoral, posterior tibial and dorsalis pedis pulses; no subclavian or femoral bruits Neurological: grossly nonfocal  LABS  CBC  Recent Labs  05/22/15 1841 05/23/15 0738  WBC 10.4 9.6  HGB 13.5 12.1  HCT 38.8 35.6*  MCV 90.0 91.8  PLT 213 178   Basic Metabolic Panel  Recent Labs  05/22/15 1841 05/23/15 0738  NA 131* 137  K 4.8 3.8  CL 92* 104  CO2 22 23  GLUCOSE 559* 153*  BUN 15 11  CREATININE 1.00 0.60  CALCIUM 9.3 8.3*   Liver Function Tests No results for input(s): AST, ALT, ALKPHOS, BILITOT, PROT, ALBUMIN in the last 72 hours.  No results for input(s): LIPASE, AMYLASE in the last 72 hours. Cardiac Enzymes  Recent Labs  05/22/15 2015 05/23/15 0135 05/23/15 0738  TROPONINI 1.09* 2.33* 2.83*    Radiology Studies Imaging results have been reviewed and Dg Chest 2 View  05/22/2015   CLINICAL DATA:  64 year old female with shortness of breath  EXAM: CHEST  2 VIEW  COMPARISON:  Radiograph dated 12/10/2013  FINDINGS: There is stable cardiomegaly. Median sternotomy wires CABG vascular clips and coronary stents noted. Stable prominence of the vasculature is suggestive of a degree of congestion. There is no focal consolidation, pleural effusion, or pneumothorax. There are emphysematous changes of the lungs.  Degenerative changes of the spine. No acute fracture.  IMPRESSION: No active cardiopulmonary disease.  No interval change.   Electronically Signed   By: Elgie Collard M.D.   On: 05/22/2015 19:22    TELE NSR  ECG NSR, LBBB  ASSESSMENT AND PLAN  Small nonSTEMI by enzymes, location uncertain due to LBBB, complicated coronary anatomy due to CABG 13 years ago and previous PCI (most recent DES to SVG-PDA about 18 months ago). For cath today, possible PCI-stent. This procedure has been fully reviewed with the patient and written informed consent has been obtained. Glucose much improved   Thurmon Fair, MD, [facility] [phone] office [phone] pager 05/23/2015 9:40 AM

## 2015-05-23 NOTE — Consult Note (Signed)
PHARMACY NOTE  Consult :   Heparin Indication :   ACS  Heparin Dosing Wt :  70 kg  LABS :  Recent Labs  05/22/15 1841 05/23/15 0135 05/23/15 0738 05/23/15 0925  HGB 13.5  --  12.1  --   HCT 38.8  --  35.6*  --   PLT 213  --  178  --   LABPROT  --   --  16.0*  --   INR  --   --  1.26  --   HEPARINUNFRC  --  0.19*  --  0.32  CREATININE 1.00  --  0.60  --    MEDICATION: Medication PTA: Not on anticoagulation Scheduled:  Scheduled:  . antiseptic oral rinse  7 mL Mouth Rinse BID  . aspirin  324 mg Oral NOW   Or  . aspirin  300 mg Rectal NOW  . [START ON 05/24/2015] aspirin EC  81 mg Oral Daily  . atorvastatin  80 mg Oral Daily  . insulin aspart  0-15 Units Subcutaneous 6 times per day  . insulin glargine  30 Units Subcutaneous Daily  . latanoprost  1 drop Both Eyes QHS  . levothyroxine  125 mcg Oral QAC breakfast  . mometasone-formoterol  2 puff Inhalation BID  . sodium chloride  3 mL Intravenous Q12H   Infusion[s]: Infusions:  . sodium chloride 125 mL/hr at 05/23/15 0616  . heparin 1,100 Units/hr (05/23/15 0306)  . nitroGLYCERIN 5 mcg/min (05/22/15 2228)   Antibiotic[s]: Anti-infectives    None      ASSESSMENT :  64 y.o. female with history of CAD Hx  CABG, PCI 20mo ago to SVG is admitted with chest pain.  Heparin  started for ACS/STEMI.  Heparin drip rate 1100 uts/hr HL at goal 0.32  CBc stable Plan to recath GOAL :  Heparin Level  0.3 - 0.7 units/ml  PLAN : 1. Continue Heparin infusion at 1100 units/hr.   2. Daily Heparin level, CBC while on Heparin.   3. Monitor for bleeding complications. Follow Platelet counts. F/u after cath Leota Sauers Pharm.D. CPP, BCPS Clinical Pharmacist [phone] 05/23/2015 1:05 PM

## 2015-05-23 NOTE — Care Management Note (Signed)
Case Management Note  Patient Details  Name: [name] MRN: [id number] Date of Birth: 1950-09-25  Subjective/Objective:      Adm w mi              Action/Plan: lives w fam, pcp dr Ardyth Gal   Expected Discharge Date:                  Expected Discharge Plan:  Home/Self Care  In-House Referral:     Discharge planning Services     Post Acute Care Choice:    Choice offered to:     DME Arranged:    DME Agency:     HH Arranged:    HH Agency:     Status of Service:     Medicare Important Message Given:    Date Medicare IM Given:    Medicare IM give by:    Date Additional Medicare IM Given:    Additional Medicare Important Message give by:     If discussed at Long Length of Stay Meetings, dates discussed:    Additional Comments:  Hanley Hays, RN 05/23/2015, 9:14 AM

## 2015-05-23 NOTE — Progress Notes (Signed)
Site area: rt groin fa sheath Site Prior to Removal:  Level  0 Pressure Applied For:  20 minutes Manual:   yes Patient Status During Pull:  stable Post Pull Site:  Level  0 Post Pull Instructions Given:  yes Post Pull Pulses Present: yes Dressing Applied:  tegaderm Bedrest begins @  1630 Comments:  0

## 2015-05-23 NOTE — Interval H&P Note (Signed)
History and Physical Interval Note:  05/23/2015 2:36 PM  [name]  has presented today for surgery, with the diagnosis of NSTEMi  The various methods of treatment have been discussed with the patient and family. After consideration of risks, benefits and other options for treatment, the patient has consented to  Procedure(s): Left Heart Cath and Cors/Grafts Angiography (N/A) as a surgical intervention .  The patient's history has been reviewed, patient examined, no change in status, stable for surgery.  I have reviewed the patient's chart and labs.  Questions were answered to the patient's satisfaction.   Cath Lab Visit (complete for each Cath Lab visit)  Clinical Evaluation Leading to the Procedure:   ACS: Yes.    Non-ACS:    Anginal Classification: CCS IV  Anti-ischemic medical therapy: Maximal Therapy (2 or more classes of medications)  Non-Invasive Test Results: No non-invasive testing performed  Prior CABG: Previous CABG        Theron Arista [facility] 05/23/2015   2:36 PM

## 2015-05-23 NOTE — Progress Notes (Signed)
Patient ID: MELADIE ING, female   DOB: 1951-08-29, 64 y.o.   MRN: [id number]     Encounter opened in error

## 2015-05-23 NOTE — Progress Notes (Signed)
[facility] PROGRESS NOTE  [name] YWV:[id number] DOB: 23-Feb-1951 DOA: 05/22/2015 PCP: Lubertha South, MD  Assessment/Plan: 1. NSTEMI -Continue IV heparin -Aspirin, statin, not on beta blocker likely due to low blood pressures -Cardiology consulted, plan for left heart cath today -2-D echocardiogram pending  2. Uncontrolled diabetes -Was started on insulin drip on admission this morning, CBGs now controlled, no DKA -Resume Lantus at half dose, sliding scale insulin  3. History of CAD status post CABG in 2003 and DES to SVG-PDA in 4/15 -As in #1  4. Hypertension -Blood pressure is soft this morning, beta blocker on hold  5. GERD -PPI  6.  morbid obesity   DVT prophylaxis : Currently on heparin drip  Code  Status: Full code  Family Communi9 bedside  DisposiKeep an stepdown Consultants:  Cardiology  HPI/Subjective: Denies any chest pain now, reports blood sugars are normally well controlled at home  Objective: Filed Vitals:   05/23/15 0800  BP:   Pulse: 96  Temp:   Resp: 20    Intake/Output Summary (Last 24 hours) at 05/23/15 1115 Last data filed at 05/23/15 0800  Gross per 24 hour  Intake 1722.78 ml  Output   1100 ml  Net 622.78 ml   Filed Weights   05/22/15 1814 05/22/15 2100  Weight: 81.647 kg (180 lb) 124 kg (273 lb 5.9 oz)    Exam:   General:  AAOx3  Cardiovascular: S1S2/RRR  Respiratory: CTAB  Abdomen: soft, NT, BS present  Musculoskeletal: no edema c/c   Data Reviewed: Basic Metabolic Panel:  Recent Labs Lab 05/22/15 1841 05/23/15 0738  NA 131* 137  K 4.8 3.8  CL 92* 104  CO2 22 23  GLUCOSE 559* 153*  BUN 15 11  CREATININE 1.00 0.60  CALCIUM 9.3 8.3*   Liver Function Tests: No results for input(s): AST, ALT, ALKPHOS, BILITOT, PROT, ALBUMIN in the last 168 hours. No results for input(s): LIPASE, AMYLASE in the last 168 hours. No results for input(s): AMMONIA in the last 168 hours. CBC:  Recent Labs Lab  05/22/15 1841 05/23/15 0738  WBC 10.4 9.6  HGB 13.5 12.1  HCT 38.8 35.6*  MCV 90.0 91.8  PLT 213 178   Cardiac Enzymes:  Recent Labs Lab 05/22/15 2015 05/23/15 0135 05/23/15 0738  TROPONINI 1.09* 2.33* 2.83*   BNP (last 3 results) No results for input(s): BNP in the last 8760 hours.  ProBNP (last 3 results) No results for input(s): PROBNP in the last 8760 hours.  CBG:  Recent Labs Lab 05/23/15 0257 05/23/15 0406 05/23/15 0514 05/23/15 0613 05/23/15 0743  GLUCAP 152* 154* 163* 184* 137*    Recent Results (from the past 240 hour(s))  MRSA PCR Screening     Status: None   Collection Time: 05/22/15  9:05 PM  Result Value Ref Range Status   MRSA by PCR NEGATIVE NEGATIVE Final    Comment:        The GeneXpert MRSA Assay (FDA approved for NASAL specimens only), is one component of a comprehensive MRSA colonization surveillance program. It is not intended to diagnose MRSA infection nor to guide or monitor treatment for MRSA infections.      Studies: Dg Chest 2 View  05/22/2015   CLINICAL DATA:  64 year old female with shortness of breath  EXAM: CHEST  2 VIEW  COMPARISON:  Radiograph dated 12/10/2013  FINDINGS: There is stable cardiomegaly. Median sternotomy wires CABG vascular clips and coronary stents noted. Stable prominence of the vasculature is suggestive of  a degree of congestion. There is no focal consolidation, pleural effusion, or pneumothorax. There are emphysematous changes of the lungs. Degenerative changes of the spine. No acute fracture.  IMPRESSION: No active cardiopulmonary disease.  No interval change.   Electronically Signed   By: Elgie Collard M.D.   On: 05/22/2015 19:22    Scheduled Meds: . antiseptic oral rinse  7 mL Mouth Rinse BID  . aspirin  324 mg Oral NOW   Or  . aspirin  300 mg Rectal NOW  . [START ON 05/24/2015] aspirin EC  81 mg Oral Daily  . atorvastatin  80 mg Oral Daily  . insulin aspart  0-15 Units Subcutaneous 6 times per day   . insulin glargine  30 Units Subcutaneous Daily  . insulin regular  0-10 Units Intravenous TID WC  . latanoprost  1 drop Both Eyes QHS  . levothyroxine  125 mcg Oral QAC breakfast  . mometasone-formoterol  2 puff Inhalation BID  . sodium chloride  3 mL Intravenous Q12H   Continuous Infusions: . sodium chloride 125 mL/hr at 05/23/15 0616  . dextrose 5 % and 0.45% NaCl    . heparin 1,100 Units/hr (05/23/15 0306)  . insulin (NOVOLIN-R) infusion 2.3 Units/hr (05/23/15 0700)  . nitroGLYCERIN 5 mcg/min (05/22/15 2228)   Antibiotics Given (last 72 hours)    None      Principal Problem:   NSTEMI (non-ST elevated myocardial infarction) Active Problems:   CAD- CABG '03, DES to S-PDA, S-Dx 2/13, S-PDA 12/13/13   Type 2 diabetes mellitus, uncontrolled   Hypothyroidism   Hyperlipidemia   Hypertension   Morbid obesity   Gastroesophageal reflux disease   Chronic low back pain    Time spent:    [facility]  [facility] Pager [phone]. If 7PM-7AM, please contact night-coverage at [email or web address], password [facility] 05/23/2015, 11:15 AM  LOS: 1 day

## 2015-05-23 NOTE — Progress Notes (Addendum)
16F foley catheter inserted by LMurphy,RN  w/o difficulty. Draining clear yellow uop.

## 2015-05-23 NOTE — Progress Notes (Signed)
Utilization Review Completed.Romie Keeble T9/05/2015  

## 2015-05-23 NOTE — Progress Notes (Signed)
ANTICOAGULATION CONSULT NOTE - Follow Up Consult  Pharmacy Consult for heparin Indication: NSTEMI  Labs:  Recent Labs  05/22/15 1841 05/22/15 2015 05/23/15 0135  HGB 13.5  --   --   HCT 38.8  --   --   PLT 213  --   --   HEPARINUNFRC  --   --  0.19*  CREATININE 1.00  --   --   TROPONINI  --  1.09* 2.33*     Assessment: 63yo female subtherapeutic on heparin with initial dosing for NSTEMI; lab drawn early.  Goal of Therapy:  Heparin level 0.3-0.7 units/ml   Plan:  Will rebolus with heparin 2000 units and increase gtt by 3 units/kgABW/hr to 1100 units/hr and check level in 6hr.  Vernard Gambles, PharmD, BCPS  05/23/2015,3:06 AM

## 2015-05-23 NOTE — H&P (View-Only) (Signed)
Patient Name: [name] Date of Encounter: 05/23/2015  Principal Problem:   NSTEMI (non-ST elevated myocardial infarction) Active Problems:   CAD- CABG '03, DES to S-PDA, S-Dx 2/13, S-PDA 12/13/13   Type 2 diabetes mellitus, uncontrolled   Hypothyroidism   Hyperlipidemia   Hypertension   Morbid obesity   Gastroesophageal reflux disease   Chronic low back pain   Length of Stay: 1  SUBJECTIVE  No angina or dyspnea. (Chronic) back pain. Very emotional about her husband's recent passing, just one month ago.  CURRENT MEDS . antiseptic oral rinse  7 mL Mouth Rinse BID  . aspirin  324 mg Oral NOW   Or  . aspirin  300 mg Rectal NOW  . [START ON 05/24/2015] aspirin EC  81 mg Oral Daily  . atorvastatin  80 mg Oral Daily  . Influenza vac split quadrivalent PF  0.5 mL Intramuscular Once  . insulin aspart  0-15 Units Subcutaneous 6 times per day  . insulin glargine  30 Units Subcutaneous Daily  . insulin regular  0-10 Units Intravenous TID WC  . latanoprost  1 drop Both Eyes QHS  . levothyroxine  125 mcg Oral QAC breakfast  . mometasone-formoterol  2 puff Inhalation BID  . sodium chloride  3 mL Intravenous Q12H    OBJECTIVE   Intake/Output Summary (Last 24 hours) at 05/23/15 0940 Last data filed at 05/23/15 0800  Gross per 24 hour  Intake 1722.78 ml  Output   1100 ml  Net 622.78 ml   Filed Weights   05/22/15 1814 05/22/15 2100  Weight: 180 lb (81.647 kg) 273 lb 5.9 oz (124 kg)    PHYSICAL EXAM Filed Vitals:   05/23/15 0700 05/23/15 0755 05/23/15 0800 05/23/15 0822  BP: 89/48     Pulse: 77  96   Temp:  97.8 F (36.6 C)    TempSrc:  Oral    Resp: 22  20   Height:      Weight:      SpO2: 98%  97% 95%   General: Alert, oriented x3, no distress, obese Head: no evidence of trauma, PERRL, EOMI, no exophtalmos or lid lag, no myxedema, no xanthelasma; normal ears, nose and oropharynx Neck: normal jugular venous pulsations and no hepatojugular reflux; brisk carotid  pulses without delay and no carotid bruits Chest: clear to auscultation, no signs of consolidation by percussion or palpation, normal fremitus, symmetrical and full respiratory excursions Cardiovascular: normal position and quality of the apical impulse, regular rhythm, normal first and paradoxically split second heart sounds, no rubs, has S4 gallop, no murmur Abdomen: no tenderness or distention, no masses by palpation, no abnormal pulsatility or arterial bruits, normal bowel sounds, no hepatosplenomegaly Extremities: no clubbing, cyanosis or edema; 2+ radial, ulnar and brachial pulses bilaterally; 2+ right femoral, posterior tibial and dorsalis pedis pulses; 2+ left femoral, posterior tibial and dorsalis pedis pulses; no subclavian or femoral bruits Neurological: grossly nonfocal  LABS  CBC  Recent Labs  05/22/15 1841 05/23/15 0738  WBC 10.4 9.6  HGB 13.5 12.1  HCT 38.8 35.6*  MCV 90.0 91.8  PLT 213 178   Basic Metabolic Panel  Recent Labs  05/22/15 1841 05/23/15 0738  NA 131* 137  K 4.8 3.8  CL 92* 104  CO2 22 23  GLUCOSE 559* 153*  BUN 15 11  CREATININE 1.00 0.60  CALCIUM 9.3 8.3*   Liver Function Tests No results for input(s): AST, ALT, ALKPHOS, BILITOT, PROT, ALBUMIN in the last 72 hours.  No results for input(s): LIPASE, AMYLASE in the last 72 hours. Cardiac Enzymes  Recent Labs  05/22/15 2015 05/23/15 0135 05/23/15 0738  TROPONINI 1.09* 2.33* 2.83*    Radiology Studies Imaging results have been reviewed and Dg Chest 2 View  05/22/2015   CLINICAL DATA:  64-year-old female with shortness of breath  EXAM: CHEST  2 VIEW  COMPARISON:  Radiograph dated 12/10/2013  FINDINGS: There is stable cardiomegaly. Median sternotomy wires CABG vascular clips and coronary stents noted. Stable prominence of the vasculature is suggestive of a degree of congestion. There is no focal consolidation, pleural effusion, or pneumothorax. There are emphysematous changes of the lungs.  Degenerative changes of the spine. No acute fracture.  IMPRESSION: No active cardiopulmonary disease.  No interval change.   Electronically Signed   By: Arash  Radparvar M.D.   On: 05/22/2015 19:22    TELE NSR  ECG NSR, LBBB  ASSESSMENT AND PLAN  Small nonSTEMI by enzymes, location uncertain due to LBBB, complicated coronary anatomy due to CABG 13 years ago and previous PCI (most recent DES to SVG-PDA about 18 months ago). For cath today, possible PCI-stent. This procedure has been fully reviewed with the patient and written informed consent has been obtained. Glucose much improved   Meliana Canner, MD, FACC [facility] [phone] office [phone] pager 05/23/2015 9:40 AM   

## 2015-05-23 NOTE — Progress Notes (Signed)
RN asked MD Julian Reil to clarify the glucose stabilizer order regarding the "target range" that is acceptable for the pt to reach for 4 consecutive hours. The orders do not state a range that the pt's CBG should be below in order to advance the phase of glucose stabilization. MD Julian Reil said to keep stabilizer on at the time and to allow for day shift to address the transition of phases. RN will share this information with day shift nurse.

## 2015-05-24 ENCOUNTER — Encounter (HOSPITAL_COMMUNITY): Payer: Self-pay

## 2015-05-24 ENCOUNTER — Inpatient Hospital Stay (HOSPITAL_COMMUNITY): Payer: Medicare Other

## 2015-05-24 DIAGNOSIS — E785 Hyperlipidemia, unspecified: Secondary | ICD-10-CM

## 2015-05-24 DIAGNOSIS — I509 Heart failure, unspecified: Secondary | ICD-10-CM

## 2015-05-24 LAB — BASIC METABOLIC PANEL
Anion gap: 9 (ref 5–15)
BUN: 7 mg/dL (ref 6–20)
CALCIUM: 8.2 mg/dL — AB (ref 8.9–10.3)
CO2: 24 mmol/L (ref 22–32)
CREATININE: 0.6 mg/dL (ref 0.44–1.00)
Chloride: 100 mmol/L — ABNORMAL LOW (ref 101–111)
GLUCOSE: 215 mg/dL — AB (ref 65–99)
Potassium: 3.4 mmol/L — ABNORMAL LOW (ref 3.5–5.1)
Sodium: 133 mmol/L — ABNORMAL LOW (ref 135–145)

## 2015-05-24 LAB — GLUCOSE, CAPILLARY
GLUCOSE-CAPILLARY: 161 mg/dL — AB (ref 65–99)
GLUCOSE-CAPILLARY: 251 mg/dL — AB (ref 65–99)
Glucose-Capillary: 256 mg/dL — ABNORMAL HIGH (ref 65–99)
Glucose-Capillary: 202 mg/dL — ABNORMAL HIGH (ref 65–99)
Glucose-Capillary: 167 mg/dL — ABNORMAL HIGH (ref 65–99)
Glucose-Capillary: 307 mg/dL — ABNORMAL HIGH (ref 65–99)
Glucose-Capillary: 208 mg/dL — ABNORMAL HIGH (ref 65–99)

## 2015-05-24 LAB — CBC
HEMATOCRIT: 34.9 % — AB (ref 36.0–46.0)
Hemoglobin: 12 g/dL (ref 12.0–15.0)
MCH: 31.5 pg (ref 26.0–34.0)
MCHC: 34.4 g/dL (ref 30.0–36.0)
MCV: 91.6 fL (ref 78.0–100.0)
PLATELETS: 195 10*3/uL (ref 150–400)
RBC: 3.81 MIL/uL — ABNORMAL LOW (ref 3.87–5.11)
RDW: 13.1 % (ref 11.5–15.5)
WBC: 8.9 10*3/uL (ref 4.0–10.5)

## 2015-05-24 MED ORDER — METOPROLOL TARTRATE 12.5 MG HALF TABLET
12.5000 mg | ORAL_TABLET | Freq: Two times a day (BID) | ORAL | Status: DC
  Administered 2015-05-24 – 2015-05-28 (×7): 12.5 mg via ORAL
  Filled 2015-05-24 (×9): qty 1

## 2015-05-24 MED ORDER — INSULIN GLARGINE 100 UNIT/ML ~~LOC~~ SOLN
60.0000 [IU] | Freq: Every day | SUBCUTANEOUS | Status: DC
  Filled 2015-05-24 (×2): qty 0.6

## 2015-05-24 MED ORDER — FUROSEMIDE 10 MG/ML IJ SOLN
40.0000 mg | Freq: Two times a day (BID) | INTRAMUSCULAR | Status: DC
  Administered 2015-05-25: 40 mg via INTRAVENOUS
  Filled 2015-05-24: qty 4

## 2015-05-24 MED ORDER — INSULIN GLARGINE 100 UNIT/ML ~~LOC~~ SOLN
60.0000 [IU] | Freq: Every day | SUBCUTANEOUS | Status: DC
  Administered 2015-05-24: 60 [IU] via SUBCUTANEOUS
  Filled 2015-05-24 (×2): qty 0.6

## 2015-05-24 MED ORDER — INSULIN GLARGINE 100 UNIT/ML ~~LOC~~ SOLN
50.0000 [IU] | Freq: Every day | SUBCUTANEOUS | Status: DC
  Filled 2015-05-24: qty 0.5

## 2015-05-24 MED ORDER — INSULIN ASPART 100 UNIT/ML ~~LOC~~ SOLN
5.0000 [IU] | Freq: Three times a day (TID) | SUBCUTANEOUS | Status: AC
  Administered 2015-05-24 (×3): 5 [IU] via SUBCUTANEOUS

## 2015-05-24 MED ORDER — INSULIN ASPART 100 UNIT/ML ~~LOC~~ SOLN
0.0000 [IU] | Freq: Three times a day (TID) | SUBCUTANEOUS | Status: DC
  Administered 2015-05-25: 3 [IU] via SUBCUTANEOUS
  Administered 2015-05-25: 8 [IU] via SUBCUTANEOUS
  Administered 2015-05-25: 11 [IU] via SUBCUTANEOUS
  Administered 2015-05-26: 5 [IU] via SUBCUTANEOUS
  Administered 2015-05-26: 3 [IU] via SUBCUTANEOUS
  Administered 2015-05-26: 5 [IU] via SUBCUTANEOUS
  Administered 2015-05-27: 2 [IU] via SUBCUTANEOUS
  Administered 2015-05-27: 5 [IU] via SUBCUTANEOUS
  Administered 2015-05-27: 2 [IU] via SUBCUTANEOUS

## 2015-05-24 MED ORDER — PERFLUTREN LIPID MICROSPHERE
INTRAVENOUS | Status: AC
  Administered 2015-05-24: 2 mL
  Filled 2015-05-24: qty 10

## 2015-05-24 MED ORDER — PERFLUTREN LIPID MICROSPHERE
1.0000 mL | INTRAVENOUS | Status: AC | PRN
  Filled 2015-05-24: qty 10

## 2015-05-24 NOTE — Progress Notes (Addendum)
Inpatient Diabetes Program Recommendations  AACE/ADA: New Consensus Statement on Inpatient Glycemic Control (2013)  Target Ranges:  Prepandial:   less than 140 mg/dL      Peak postprandial:   less than 180 mg/dL (1-2 hours)      Critically ill patients:  140 - 180 mg/dL   Results for [name], [name] (MRN [id number]) as of 05/24/2015 13:33  Ref. Range 05/23/2015 07:43 05/23/2015 12:06 05/23/2015 20:35 05/24/2015 01:06 05/24/2015 04:01 05/24/2015 07:54 05/24/2015 08:03 05/24/2015 12:01  Glucose-Capillary Latest Ref Range: 65-99 mg/dL 902 (H) 409 (H) 735 (H) 256 (H) 202 (H) 167 (H) 161 (H) 307 (H)    Diabetes history: DM2 Outpatient Diabetes medications: Lantus 60 units QHS, Novolog 15-40 units ACHS & 2am per sliding scale Current orders for Inpatient glycemic control: Lantus 50 units QHS, Novolog 0-14 units Q4H, Novolog 5 units TID with meals for meal coverage  Inpatient Diabetes Program Recommendations Insulin - Basal: Patient was on an insulin drip and was transitioned to SQ insulin yesterday morning. Patient received Lantus 30 units at 8:00 on 05/23/15. Patient is now ordered Lantus 50 units QHS. Recommend changing frequency of Lantus to Q24 hours and start now so that patient receives basal insulin now to help prevent glucose from rising further throughout the day. HgbA1C: A1C in process.   Note: Will continue to follow and make further recommendations as more data is collected.  05/24/15@15 :05-Called patient over the phone and spoke with her regarding diabetes and home regimen for diabetes control. Patient stated that she did not feel well and did not want to talk with me long.  Patient reports that she is followed by Dr. Fransico [name] (Endocrinologist in [place]) for diabetes management and currently she takes Lantus 60 units QHS and Novolog 15-40 units ACHS (based on correction scale) as an outpatient for diabetes control.  Inquired about last A1C and patient reports that her last A1C was 13%. Patient states  that she checks her glucose 4 times a day and her glucose had recently been improved and running in the 130-150's mg/dl up until a few days ago when her glucose started running in the 300-500's mg/dl. Inquired about why she felt her A1C was so high since an A1C of 13% would indicate an average glucose of 330 mg/dl. Patient stated that her glucose was running in the 300's mg/d up until her husband died on 08-30-24and since then it went back down in the low to mid 100's mg/dl. Discussed importance of maintaining glycemic control with target A1C of 7% or less. Briefly discussed potential complications from uncontrolled diabetes and how it damages inner lining of blood vessels that lead to complications commonly seen with uncontrolled diabetes. Encouraged patient to check her glucose at least 4 times per day and to keep a record of glucose and amount of insulin taken so that her doctor could use the information to make insulin adjustments. Patient verbalized understanding of information discussed and she states that she has no further questions at this time related to diabetes.   Thanks, Orlando Penner, RN, MSN, CCRN, CDE Diabetes Coordinator Inpatient Diabetes Program [phone] (Team Pager from 8am to 5pm) [phone] ([facility] office) [phone] [facility] office) [phone] [facility] office)

## 2015-05-24 NOTE — Progress Notes (Signed)
[facility] PROGRESS NOTE  [name] WUJ:[id number] DOB: [birth year] DOA: 05/22/2015 PCP: Lubertha South, MD  Assessment/Plan: 1. NSTEMI -Continue IV heparin for 1 more day -Aspirin, statin, restart low dose BB, BP improved -Cardiology following, s/p left heart cath 9/9:  occlusion of the SVG to diagonal, other grafts patent, plan to Rx medically -2-D echocardiogram pending  2. Uncontrolled diabetes -Was started on insulin drip on admission 9/9, CBGs poorly controlled, no DKA -Resume Lantus at full dose tonight at QHS, add novolog with meals, sliding scale insulin  3. History of CAD status post CABG in 2003 and DES to SVG-PDA in 4/15 -As in #1  4. Hypertension -Blood pressure improved, beta blocker resumed  5. GERD -PPI  6.  morbid obesity   DVT prophylaxis : Currently on heparin drip  Code  Status: Full code  Family Communi9 bedside  Disposition: Tx to tele later   Consultants:  Cardiology  HPI/Subjective: Denies any chest pain now, uncomfortable in bed  Objective: Filed Vitals:   05/24/15 0900  BP: 101/43  Pulse:   Temp:   Resp: 17    Intake/Output Summary (Last 24 hours) at 05/24/15 1023 Last data filed at 05/24/15 0900  Gross per 24 hour  Intake   1435 ml  Output   3525 ml  Net  -2090 ml   Filed Weights   05/22/15 1814 05/22/15 2100  Weight: 81.647 kg (180 lb) 124 kg (273 lb 5.9 oz)    Exam:   General:  AAOx3  Cardiovascular: S1S2/RRR  Respiratory: CTAB  Abdomen: soft, NT, BS present  Musculoskeletal: no edema c/c   Data Reviewed: Basic Metabolic Panel:  Recent Labs Lab 05/22/15 1841 05/23/15 0738 05/24/15 0300  NA 131* 137 133*  K 4.8 3.8 3.4*  CL 92* 104 100*  CO2 GLUCOSE 559* 153* 215*  BUN CREATININE 1.00 0.60 0.60  CALCIUM 9.3 8.3* 8.2*   Liver Function Tests: No results for input(s): AST, ALT, ALKPHOS, BILITOT, PROT, ALBUMIN in the last 168 hours. No results for input(s): LIPASE, AMYLASE  in the last 168 hours. No results for input(s): AMMONIA in the last 168 hours. CBC:  Recent Labs Lab 05/22/15 1841 05/23/15 0738 05/24/15 0300  WBC 10.4 9.6 8.9  HGB 13.5 12.1 12.0  HCT 38.8 35.6* 34.9*  MCV 90.0 91.8 91.6  PLT 213 178 195   Cardiac Enzymes:  Recent Labs Lab 05/22/15 2015 05/23/15 0135 05/23/15 0738  TROPONINI 1.09* 2.33* 2.83*   BNP (last 3 results) No results for input(s): BNP in the last 8760 hours.  ProBNP (last 3 results) No results for input(s): PROBNP in the last 8760 hours.  CBG:  Recent Labs Lab 05/23/15 2035 05/24/15 0106 05/24/15 0401 05/24/15 0754 05/24/15 0803  GLUCAP 240* 256* 202* 167* 161*    Recent Results (from the past 240 hour(s))  MRSA PCR Screening     Status: None   Collection Time: 05/22/15  9:05 PM  Result Value Ref Range Status   MRSA by PCR NEGATIVE NEGATIVE Final    Comment:        The GeneXpert MRSA Assay (FDA approved for NASAL specimens only), is one component of a comprehensive MRSA colonization surveillance program. It is not intended to diagnose MRSA infection nor to guide or monitor treatment for MRSA infections.      Studies: Dg Chest 2 View  05/22/2015   CLINICAL DATA:  64 year old female with shortness of breath  EXAM: CHEST  2 VIEW  COMPARISON:  Radiograph dated 12/10/2013  FINDINGS: There is stable cardiomegaly. Median sternotomy wires CABG vascular clips and coronary stents noted. Stable prominence of the vasculature is suggestive of a degree of congestion. There is no focal consolidation, pleural effusion, or pneumothorax. There are emphysematous changes of the lungs. Degenerative changes of the spine. No acute fracture.  IMPRESSION: No active cardiopulmonary disease.  No interval change.   Electronically Signed   By: Elgie Collard M.D.   On: 05/22/2015 19:22    Scheduled Meds: . antiseptic oral rinse  7 mL Mouth Rinse BID  . aspirin EC  81 mg Oral Daily  . atorvastatin  80 mg Oral Daily   . furosemide  40 mg Intravenous Q12H  . heparin  5,000 Units Subcutaneous 3 times per day  . insulin aspart  0-15 Units Subcutaneous 6 times per day  . insulin aspart  5 Units Subcutaneous TID WC  . insulin glargine  50 Units Subcutaneous QHS  . isosorbide mononitrate  60 mg Oral Daily  . latanoprost  1 drop Both Eyes QHS  . levothyroxine  125 mcg Oral QAC breakfast  . metoprolol tartrate  12.5 mg Oral BID  . mometasone-formoterol  2 puff Inhalation BID  . sodium chloride  3 mL Intravenous Q12H   Continuous Infusions:   Antibiotics Given (last 72 hours)    None      Principal Problem:   NSTEMI (non-ST elevated myocardial infarction) Active Problems:   CAD- CABG '03, DES to S-PDA, S-Dx 2/13, S-PDA 12/13/13   Type 2 diabetes mellitus, uncontrolled   Hypothyroidism   Hyperlipidemia   Hypertension   Morbid obesity   Gastroesophageal reflux disease   Chronic low back pain    Time spent:    [facility]  [facility] Pager [phone]. If 7PM-7AM, please contact night-coverage at [email or web address], password [facility] 05/24/2015, 10:23 AM  LOS: 2 days

## 2015-05-24 NOTE — Progress Notes (Addendum)
SUBJECTIVE: The patient is doing well today.  At this time, she denies chest pain, shortness of breath, or any new concerns.  [name] antiseptic oral rinse  7 mL Mouth Rinse BID  . aspirin EC  81 mg Oral Daily  . atorvastatin  80 mg Oral Daily  . furosemide  40 mg Intravenous Q12H  . heparin  5,000 Units Subcutaneous 3 times per day  . insulin aspart  0-15 Units Subcutaneous 6 times per day  . insulin aspart  5 Units Subcutaneous TID WC  . insulin glargine  50 Units Subcutaneous QHS  . isosorbide mononitrate  60 mg Oral Daily  . latanoprost  1 drop Both Eyes QHS  . levothyroxine  125 mcg Oral QAC breakfast  . metoprolol tartrate  12.5 mg Oral BID  . mometasone-formoterol  2 puff Inhalation BID  . sodium chloride  3 mL Intravenous Q12H      OBJECTIVE: Physical Exam: Filed Vitals:   05/24/15 0900 05/24/15 1000 05/24/15 1100 05/24/15 1205  BP: 101/43 102/55 95/48   Pulse:      Temp:    97.9 F (36.6 C)  TempSrc:      Resp: 17 16 25    Height:      Weight:      SpO2:        Intake/Output Summary (Last 24 hours) at 05/24/15 1323 Last data filed at 05/24/15 0900  Gross per 24 hour  Intake   1060 ml  Output   3525 ml  Net  -2465 ml    Telemetry reveals sinus rhythm with frequent PVCs and short NSVT  GEN- The patient is morbidly obese appearing, alert and oriented x 3 today.   Head- normocephalic, atraumatic Eyes-  Sclera clear, conjunctiva pink Ears- hearing intact Oropharynx- clear Neck- supple,  Lungs- Clear to ausculation bilaterally, normal work of breathing Heart- Regular rate and rhythm, no murmurs, rubs or gallops, PMI not laterally displaced GI- soft, NT, ND, + BS Extremities- no clubbing, cyanosis, or edema Skin- no rash or lesion Psych- euthymic mood, full affect Neuro- strength and sensation are intact  LABS: Basic Metabolic Panel:  Recent Labs  21/11/55 0738 05/24/15 0300  NA 137 133*  K 3.8 3.4*  CL 104 100*  CO2 23 24  GLUCOSE 153* 215*  BUN  11 7  CREATININE 0.60 0.60  CALCIUM 8.3* 8.2*   Liver Function Tests: No results for input(s): AST, ALT, ALKPHOS, BILITOT, PROT, ALBUMIN in the last 72 hours. No results for input(s): LIPASE, AMYLASE in the last 72 hours. CBC:  Recent Labs  05/23/15 0738 05/24/15 0300  WBC 9.6 8.9  HGB 12.1 12.0  HCT 35.6* 34.9*  MCV 91.8 91.6  PLT 178 195   Cardiac Enzymes:  Recent Labs  05/22/15 2015 05/23/15 0135 05/23/15 0738  TROPONINI 1.09* 2.33* 2.83*   BNP:  ASSESSMENT AND PLAN:  Principal Problem:   NSTEMI (non-ST elevated myocardial infarction) Active Problems:   CAD- CABG '03, DES to S-PDA, S-Dx 2/13, S-PDA 12/13/13   Type 2 diabetes mellitus, uncontrolled   Hypothyroidism   Hyperlipidemia   Hypertension   Morbid obesity   Gastroesophageal reflux disease   Chronic low back pain 1. NSTEMI The patient has extensive nonrevascularizable CAD We discussed lifestyle modification at length today I think that cardiac rehab is probably her best long term option  Aspirin, statin, restart low dose BB as bp allows  May also benefit from plavix  2-D echocardiogram pending  2. Uncontrolled diabetes Per  primary team Outpatient nutrition consult would be beneficial  3. Hypertension Stable No change required today  4. HL On high dose statin  5. morbid obesity  Lifestyle modification encouraged  Transfer to telemetry  Hillis Range, MD 05/24/2015 1:23 PM

## 2015-05-24 NOTE — Progress Notes (Signed)
  Echocardiogram 2D Echocardiogram has been performed.  Delcie Roch 05/24/2015, 2:49 PM

## 2015-05-25 ENCOUNTER — Inpatient Hospital Stay (HOSPITAL_COMMUNITY): Payer: Medicare Other

## 2015-05-25 LAB — CBC
HEMATOCRIT: 34 % — AB (ref 36.0–46.0)
HEMOGLOBIN: 11.8 g/dL — AB (ref 12.0–15.0)
MCH: 31.7 pg (ref 26.0–34.0)
MCHC: 34.7 g/dL (ref 30.0–36.0)
MCV: 91.4 fL (ref 78.0–100.0)
Platelets: 185 10*3/uL (ref 150–400)
RBC: 3.72 MIL/uL — ABNORMAL LOW (ref 3.87–5.11)
RDW: 13 % (ref 11.5–15.5)
WBC: 8.1 10*3/uL (ref 4.0–10.5)

## 2015-05-25 LAB — BASIC METABOLIC PANEL
ANION GAP: 11 (ref 5–15)
BUN: 14 mg/dL (ref 6–20)
CHLORIDE: 98 mmol/L — AB (ref 101–111)
CO2: 25 mmol/L (ref 22–32)
CREATININE: 0.74 mg/dL (ref 0.44–1.00)
Calcium: 8.7 mg/dL — ABNORMAL LOW (ref 8.9–10.3)
GFR calc non Af Amer: >60 mL/min (ref >60)
Glucose, Bld: 303 mg/dL — ABNORMAL HIGH (ref 65–99)
Potassium: 3.5 mmol/L (ref 3.5–5.1)
Sodium: 134 mmol/L — ABNORMAL LOW (ref 135–145)

## 2015-05-25 LAB — GLUCOSE, CAPILLARY
GLUCOSE-CAPILLARY: 283 mg/dL — AB (ref 65–99)
GLUCOSE-CAPILLARY: 198 mg/dL — AB (ref 65–99)
Glucose-Capillary: 339 mg/dL — ABNORMAL HIGH (ref 65–99)
Glucose-Capillary: 276 mg/dL — ABNORMAL HIGH (ref 65–99)

## 2015-05-25 MED ORDER — ISOSORBIDE MONONITRATE ER 30 MG PO TB24
30.0000 mg | ORAL_TABLET | Freq: Every day | ORAL | Status: DC
  Administered 2015-05-26 – 2015-05-27 (×2): 30 mg via ORAL
  Filled 2015-05-25 (×2): qty 1

## 2015-05-25 MED ORDER — FUROSEMIDE 40 MG PO TABS: 40.0000 mg | ORAL_TABLET | Freq: Every day | ORAL | Status: DC

## 2015-05-25 MED ORDER — LEVOTHYROXINE SODIUM 25 MCG PO TABS
137.0000 ug | ORAL_TABLET | Freq: Every day | ORAL | Status: DC
  Administered 2015-05-26 – 2015-05-28 (×3): 137 ug via ORAL
  Filled 2015-05-25 (×6): qty 1

## 2015-05-25 MED ORDER — CLOPIDOGREL BISULFATE 75 MG PO TABS
75.0000 mg | ORAL_TABLET | Freq: Every day | ORAL | Status: DC
  Administered 2015-05-25 – 2015-05-28 (×4): 75 mg via ORAL
  Filled 2015-05-25 (×4): qty 1

## 2015-05-25 MED ORDER — INSULIN GLARGINE 100 UNIT/ML ~~LOC~~ SOLN
65.0000 [IU] | Freq: Every day | SUBCUTANEOUS | Status: DC
  Administered 2015-05-25 – 2015-05-27 (×3): 65 [IU] via SUBCUTANEOUS
  Filled 2015-05-25 (×4): qty 0.65

## 2015-05-25 NOTE — Progress Notes (Signed)
Patient Name: [name] Date of Encounter: 05/25/2015     Principal Problem:   NSTEMI (non-ST elevated myocardial infarction) Active Problems:   CAD- CABG '03, DES to S-PDA, S-Dx 2/13, S-PDA 12/13/13   Type 2 diabetes mellitus, uncontrolled   Hypothyroidism   Hyperlipidemia   Hypertension   Morbid obesity   Gastroesophageal reflux disease   Chronic low back pain    SUBJECTIVE  Patient complains of feeling tired and weak.  No further chest pain.  Her blood pressure remains low. She is still coughing up some chunks of greenish sputum.  CURRENT MEDS . antiseptic oral rinse  7 mL Mouth Rinse BID  . aspirin EC  81 mg Oral Daily  . atorvastatin  80 mg Oral Daily  . furosemide  40 mg Intravenous Q12H  . heparin  5,000 Units Subcutaneous 3 times per day  . insulin aspart  0-15 Units Subcutaneous TID WC  . insulin glargine  60 Units Subcutaneous QHS  . isosorbide mononitrate  60 mg Oral Daily  . latanoprost  1 drop Both Eyes QHS  . levothyroxine  125 mcg Oral QAC breakfast  . metoprolol tartrate  12.5 mg Oral BID  . mometasone-formoterol  2 puff Inhalation BID  . sodium chloride  3 mL Intravenous Q12H    OBJECTIVE  Filed Vitals:   05/24/15 1659 05/24/15 1900 05/25/15 0533 05/25/15 1048  BP: 85/43 97/60 96/48  99/57  Pulse: 90  85   Temp: 98.3 F (36.8 C) 98 F (36.7 C) 99 F (37.2 C)   TempSrc: Oral Oral Oral   Resp: 20  20   Height:      Weight:  271 lb 4.8 oz (123.061 kg)    SpO2: 96% 95% 94%     Intake/Output Summary (Last 24 hours) at 05/25/15 1106 Last data filed at 05/25/15 0842  Gross per 24 hour  Intake    520 ml  Output   1000 ml  Net   -480 ml   Filed Weights   05/22/15 1814 05/22/15 2100 05/24/15 1900  Weight: 180 lb (81.647 kg) 273 lb 5.9 oz (124 kg) 271 lb 4.8 oz (123.061 kg)    PHYSICAL EXAM  General: Pleasant, NAD.  Morbidly obese Neuro: Alert and oriented X 3. Moves all extremities spontaneously. Psych: Normal affect. HEENT:   Normal  Neck: Supple without bruits or JVD. Lungs:  Resp regular and unlabored, CTA. Heart: RRR no s3, s4, or murmurs. Abdomen: Soft, non-tender, non-distended, BS + x 4.  Extremities: No clubbing, cyanosis or edema. DP/PT/Radials 2+ and equal bilaterally.  Accessory Clinical Findings  CBC  Recent Labs  05/24/15 0300 05/25/15 0320  WBC 8.9 8.1  HGB 12.0 11.8*  HCT 34.9* 34.0*  MCV 91.6 91.4  PLT 195 185   Basic Metabolic Panel  Recent Labs  05/24/15 0300 05/25/15 0320  NA 133* 134*  K 3.4* 3.5  CL 100* 98*  CO2 24 25  GLUCOSE 215* 303*  BUN 7 14  CREATININE 0.60 0.74  CALCIUM 8.2* 8.7*   Liver Function Tests No results for input(s): AST, ALT, ALKPHOS, BILITOT, PROT, ALBUMIN in the last 72 hours. No results for input(s): LIPASE, AMYLASE in the last 72 hours. Cardiac Enzymes  Recent Labs  05/22/15 2015 05/23/15 0135 05/23/15 0738  TROPONINI 1.09* 2.33* 2.83*   BNP Invalid input(s): POCBNP D-Dimer No results for input(s): DDIMER in the last 72 hours. Hemoglobin A1C No results for input(s): HGBA1C in the last 72 hours. Fasting Lipid  Panel No results for input(s): CHOL, HDL, LDLCALC, TRIG, CHOLHDL, LDLDIRECT in the last 72 hours. Thyroid Function Tests No results for input(s): TSH, T4TOTAL, T3FREE, THYROIDAB in the last 72 hours.  Invalid input(s): FREET3  TELE  Normal sinus rhythm  ECG    Radiology/Studies  Dg Chest 2 View  05/22/2015   CLINICAL DATA:  64 year old female with shortness of breath  EXAM: CHEST  2 VIEW  COMPARISON:  Radiograph dated 12/10/2013  FINDINGS: There is stable cardiomegaly. Median sternotomy wires CABG vascular clips and coronary stents noted. Stable prominence of the vasculature is suggestive of a degree of congestion. There is no focal consolidation, pleural effusion, or pneumothorax. There are emphysematous changes of the lungs. Degenerative changes of the spine. No acute fracture.  IMPRESSION: No active cardiopulmonary  disease.  No interval change.   Electronically Signed   By: Elgie Collard M.D.   On: 05/22/2015 19:22    ASSESSMENT AND PLAN Principal Problem:  NSTEMI (non-ST elevated myocardial infarction) Active Problems:  CAD- CABG '03, DES to S-PDA, S-Dx 2/13, S-PDA 12/13/13  Type 2 diabetes mellitus, uncontrolled  Hypothyroidism  Hyperlipidemia  Hypertension  Morbid obesity  Gastroesophageal reflux disease  Chronic low back pain 1. NSTEMI The patient has extensive nonrevascularizable CAD We discussed lifestyle modification at length today I think that cardiac rehab is probably her best long term option Aspirin, statin, restart low dose BB as bp allows I will start her on Plavix for her non-STEMI. 2-D echocardiogram demonstrates mild left ventricular systolic dysfunction with ejection fraction 40-45% and anterior wall motion abnormalities She is currently on IV Lasix.  Blood pressure is low.  Her weight is down 9 pounds.  She appears to be euvolemic now.  We will reduce diuretic to 40 mg by mouth daily starting tomorrow. 2. Uncontrolled diabetes Per primary team Outpatient nutrition consult would be beneficial  3. Hypertension Blood pressure now low.  Stop IV Lasix.  4. HL On high dose statin  5. morbid obesity  Lifestyle modification encouraged 6.  Cough with intermittent production of greenish sputum.  Per primary team  Signed, Cassell Clement MD

## 2015-05-25 NOTE — Care Management Important Message (Signed)
Important Message  Patient Details  Name: [name] MRN: [id number] Date of Birth: 11-29-1950   Medicare Important Message Given:  Yes-second notification given    Antony Haste, RN 05/25/2015, 12:31 PM

## 2015-05-25 NOTE — Progress Notes (Signed)
[facility] PROGRESS NOTE  [name] TZG:[id number] DOB: 01-27-1951 DOA: 05/22/2015 PCP: Lubertha South, MD  Assessment/Plan: 1. NSTEMI -Continue Aspirin, statin,  low dose BB -Cardiology following, s/p left heart cath 9/9:  occlusion of the SVG to diagonal, other grafts patent, plan to Rx medically -2-D echocardiogram with EF of 45%, drop from before -will cut down imdur dose due to soft BP  2. Uncontrolled diabetes -Was started on insulin drip on admission 9/9, CBGs poorly controlled, no DKA -Resumed Lantus at full dose 9/10, will increase dose, continue  novolog with meals, sliding scale insulin  3. History of CAD status post CABG in 2003 and DES to SVG-PDA in 4/15 -non revascularizeable disease on LHC -As in #1  4. Hypertension -Blood pressure still soft beta blocker resumed -lasix stopped, imdur dose cut down  5. GERD -PPI  6.  morbid obesity   7. Productive cough -Check CXR, incentive spirometry  DVT prophylaxis : Hep SQ  Code  Status: Full code  Family Communi: family at bedside  Disposition: home when stable  Consultants:  Cardiology  HPI/Subjective: Denies any chest pain now, feels tired, cough with green phlegm  Objective: Filed Vitals:   05/25/15 1048  BP: 99/57  Pulse:   Temp:   Resp:     Intake/Output Summary (Last 24 hours) at 05/25/15 1149 Last data filed at 05/25/15 0842  Gross per 24 hour  Intake    520 ml  Output   1000 ml  Net   -480 ml   Filed Weights   05/22/15 1814 05/22/15 2100 05/24/15 1900  Weight: 81.647 kg (180 lb) 124 kg (273 lb 5.9 oz) 123.061 kg (271 lb 4.8 oz)    Exam:   General:  AAOx3  Cardiovascular: S1S2/RRR  Respiratory: CTAB  Abdomen: soft, NT, BS present  Musculoskeletal: no edema c/c   Data Reviewed: Basic Metabolic Panel:  Recent Labs Lab 05/22/15 1841 05/23/15 0738 05/24/15 0300 05/25/15 0320  NA 131* 137 133* 134*  K 4.8 3.8 3.4* 3.5  CL 92* 104 100* 98*  CO2 22 23 24 25    GLUCOSE 559* 153* 215* 303*  BUN 15 11 7 14   CREATININE 1.00 0.60 0.60 0.74  CALCIUM 9.3 8.3* 8.2* 8.7*   Liver Function Tests: No results for input(s): AST, ALT, ALKPHOS, BILITOT, PROT, ALBUMIN in the last 168 hours. No results for input(s): LIPASE, AMYLASE in the last 168 hours. No results for input(s): AMMONIA in the last 168 hours. CBC:  Recent Labs Lab 05/22/15 1841 05/23/15 0738 05/24/15 0300 05/25/15 0320  WBC 10.4 9.6 8.9 8.1  HGB 13.5 12.1 12.0 11.8*  HCT 38.8 35.6* 34.9* 34.0*  MCV 90.0 91.8 91.6 91.4  PLT 213 178 195 185   Cardiac Enzymes:  Recent Labs Lab 05/22/15 2015 05/23/15 0135 05/23/15 0738  TROPONINI 1.09* 2.33* 2.83*   BNP (last 3 results) No results for input(s): BNP in the last 8760 hours.  ProBNP (last 3 results) No results for input(s): PROBNP in the last 8760 hours.  CBG:  Recent Labs Lab 05/24/15 1201 05/24/15 1655 05/24/15 2019 05/25/15 0839 05/25/15 1059  GLUCAP 307* 251* 208* 339* 283*    Recent Results (from the past 240 hour(s))  MRSA PCR Screening     Status: None   Collection Time: 05/22/15  9:05 PM  Result Value Ref Range Status   MRSA by PCR NEGATIVE NEGATIVE Final    Comment:        The GeneXpert MRSA Assay (FDA approved  for NASAL specimens only), is one component of a comprehensive MRSA colonization surveillance program. It is not intended to diagnose MRSA infection nor to guide or monitor treatment for MRSA infections.      Studies: No results found.  Scheduled Meds: . antiseptic oral rinse  7 mL Mouth Rinse BID  . aspirin EC  81 mg Oral Daily  . atorvastatin  80 mg Oral Daily  . clopidogrel  75 mg Oral Daily  . [START ON 05/26/2015] furosemide  40 mg Oral Daily  . heparin  5,000 Units Subcutaneous 3 times per day  . insulin aspart  0-15 Units Subcutaneous TID WC  . insulin glargine  65 Units Subcutaneous QHS  . [START ON 05/26/2015] isosorbide mononitrate  30 mg Oral Daily  . latanoprost  1 drop  Both Eyes QHS  . levothyroxine  125 mcg Oral QAC breakfast  . metoprolol tartrate  12.5 mg Oral BID  . mometasone-formoterol  2 puff Inhalation BID  . sodium chloride  3 mL Intravenous Q12H   Continuous Infusions:   Antibiotics Given (last 72 hours)    None      Principal Problem:   NSTEMI (non-ST elevated myocardial infarction) Active Problems:   CAD- CABG '03, DES to S-PDA, S-Dx 2/13, S-PDA 12/13/13   Type 2 diabetes mellitus, uncontrolled   Hypothyroidism   Hyperlipidemia   Hypertension   Morbid obesity   Gastroesophageal reflux disease   Chronic low back pain    Time spent:    [facility]  [facility] Pager [phone]. If 7PM-7AM, please contact night-coverage at [email or web address], password [facility] 05/25/2015, 11:49 AM  LOS: 3 days

## 2015-05-26 ENCOUNTER — Encounter (HOSPITAL_COMMUNITY): Payer: Self-pay | Admitting: Cardiology

## 2015-05-26 LAB — HEMOGLOBIN A1C
Hgb A1c MFr Bld: 11.7 % — ABNORMAL HIGH (ref 4.8–5.6)
MEAN PLASMA GLUCOSE: 289 mg/dL

## 2015-05-26 LAB — BASIC METABOLIC PANEL
Anion gap: 10 (ref 5–15)
BUN: 10 mg/dL (ref 6–20)
CHLORIDE: 96 mmol/L — AB (ref 101–111)
CO2: 26 mmol/L (ref 22–32)
CREATININE: 0.6 mg/dL (ref 0.44–1.00)
Calcium: 8.6 mg/dL — ABNORMAL LOW (ref 8.9–10.3)
GFR calc non Af Amer: >60 mL/min (ref >60)
Glucose, Bld: 239 mg/dL — ABNORMAL HIGH (ref 65–99)
POTASSIUM: 3.7 mmol/L (ref 3.5–5.1)
Sodium: 132 mmol/L — ABNORMAL LOW (ref 135–145)

## 2015-05-26 LAB — CBC
HEMATOCRIT: 34.4 % — AB (ref 36.0–46.0)
HEMOGLOBIN: 11.6 g/dL — AB (ref 12.0–15.0)
MCH: 30.6 pg (ref 26.0–34.0)
MCHC: 33.7 g/dL (ref 30.0–36.0)
MCV: 90.8 fL (ref 78.0–100.0)
Platelets: 194 10*3/uL (ref 150–400)
RBC: 3.79 MIL/uL — ABNORMAL LOW (ref 3.87–5.11)
RDW: 12.8 % (ref 11.5–15.5)
WBC: 7.6 10*3/uL (ref 4.0–10.5)

## 2015-05-26 LAB — GLUCOSE, CAPILLARY
GLUCOSE-CAPILLARY: 186 mg/dL — AB (ref 65–99)
GLUCOSE-CAPILLARY: 247 mg/dL — AB (ref 65–99)
GLUCOSE-CAPILLARY: 219 mg/dL — AB (ref 65–99)
GLUCOSE-CAPILLARY: 202 mg/dL — AB (ref 65–99)
Glucose-Capillary: 174 mg/dL — ABNORMAL HIGH (ref 65–99)

## 2015-05-26 LAB — BRAIN NATRIURETIC PEPTIDE: B NATRIURETIC PEPTIDE 5: 506.6 pg/mL — AB (ref 0.0–100.0)

## 2015-05-26 MED ORDER — POTASSIUM CHLORIDE CRYS ER 20 MEQ PO TBCR
40.0000 meq | EXTENDED_RELEASE_TABLET | Freq: Two times a day (BID) | ORAL | Status: DC
  Administered 2015-05-26 – 2015-05-28 (×5): 40 meq via ORAL
  Filled 2015-05-26 (×5): qty 2

## 2015-05-26 MED ORDER — ALPRAZOLAM 0.5 MG PO TABS
0.5000 mg | ORAL_TABLET | Freq: Every evening | ORAL | Status: DC | PRN
  Administered 2015-05-26: 0.5 mg via ORAL
  Filled 2015-05-26: qty 1

## 2015-05-26 MED ORDER — FUROSEMIDE 10 MG/ML IJ SOLN
40.0000 mg | Freq: Two times a day (BID) | INTRAMUSCULAR | Status: DC
  Administered 2015-05-26 (×2): 40 mg via INTRAVENOUS
  Filled 2015-05-26 (×2): qty 4

## 2015-05-26 NOTE — Progress Notes (Signed)
At 2335 entered pts room to turn bed alarm on--pt states she "don't feel right", unable to elaborate except "my chest maybe feels a little uncomfortable--I need to get in the chair".  Pt with facial grimace and mildly breathing heavier- however, pt denied shortness of breath. Pt assisted to recliner,  O2 sat 100% on room air BP 109/58.  O2 applied 2L/[place], EKG obtained without acute changes noted/also reviewed by Westley Chandler RN.  Pt continues to report "I just don't feel right in my chest". BP 110/68 HR 94  NTG SL given x1.  Repeat BP 95/55 after NTG.  Pt reports the "uneasy feeling in my chest is much better". Pt visibly more comfortable and resting.  Repeated BP again 99/58.  Will continue to monitor. Dierdre Highman, RN

## 2015-05-26 NOTE — Progress Notes (Signed)
   05/26/15 1100  Clinical Encounter Type  Visited With Patient;Other (Comment);Health care provider;Patient and family together [name])  Visit Type Initial;Social support  Referral From Nurse;Patient  Spiritual Encounters  Spiritual Needs Grief support;Emotional  CH respond to consult; Pt grieving recent loss of spouse and major life transition; Pt questioning faith; CH offered spiritual, grief and emotional support; CH will follow up as needed. 11:46 AM [name]

## 2015-05-26 NOTE — Progress Notes (Addendum)
[facility] PROGRESS NOTE  [name] TWK:[id number] DOB: [birth year] DOA: 05/22/2015 PCP: Lubertha South, MD  Assessment/Plan: NSTEMI -Continue Aspirin, statin,  low dose BB -Cardiology following, s/p left heart cath 9/9:  occlusion of the SVG to diagonal, other grafts patent, plan to Rx medically -2-D echocardiogram with EF of 45%, drop from before -imdur dose decrased due to soft BP -chest pain is improved  Acute systolic CHF -very symptomatic today -EF is 45% -Iv lasix, BP sightly improved 104/58 this am, follow I/Os,weights -CXR 9/11 afternoon with CHF, i didn't give her lasix yesterday since BP was in 80-90s  Uncontrolled diabetes -Was started on insulin drip on admission 9/9, CBGs poorly controlled, no DKA -Resumed Lantus at full dose 9/10, continue  novolog with meals, sliding scale insulin -CBgs improved  History of CAD status post CABG in 2003 and DES to SVG-PDA in 4/15 -non revascularizeable disease on LHC -As in #1  Hypertension -Blood pressure still soft but improved, continue beta blocker  -lasix restarted, imdur dose cut down  GERD -PPI  Morbid obesity   DVT prophylaxis : Hep SQ  Code  Status: Full code  Family Communi: family at bedside  Disposition: home when stable  Consultants:  Cardiology  HPI/Subjective: Dyspnea with minimal activity, HR 120s with standing  Objective: Filed Vitals:   05/26/15 1023  BP: 104/58  Pulse: 88  Temp:   Resp:     Intake/Output Summary (Last 24 hours) at 05/26/15 1024 Last data filed at 05/25/15 1700  Gross per 24 hour  Intake    580 ml  Output    850 ml  Net   -270 ml   Filed Weights   05/22/15 2100 05/24/15 1900 05/26/15 0510  Weight: 124 kg (273 lb 5.9 oz) 123.061 kg (271 lb 4.8 oz) 122.925 kg (271 lb)    Exam:   General:  AAOx3  Cardiovascular: S1S2/RRR  Respiratory: crackles at bases  Abdomen: soft, NT, BS present  Musculoskeletal: trace edema c/c   Data Reviewed: Basic  Metabolic Panel:  Recent Labs Lab 05/22/15 1841 05/23/15 0738 05/24/15 0300 05/25/15 0320 05/26/15 0400  NA 131* 137 133* 134* 132*  K 4.8 3.8 3.4* 3.5 3.7  CL 92* 104 100* 98* 96*  CO2 22 23 24 25 26   GLUCOSE 559* 153* 215* 303* 239*  BUN 15 11 7 14 10   CREATININE 1.00 0.60 0.60 0.74 0.60  CALCIUM 9.3 8.3* 8.2* 8.7* 8.6*   Liver Function Tests: No results for input(s): AST, ALT, ALKPHOS, BILITOT, PROT, ALBUMIN in the last 168 hours. No results for input(s): LIPASE, AMYLASE in the last 168 hours. No results for input(s): AMMONIA in the last 168 hours. CBC:  Recent Labs Lab 05/22/15 1841 05/23/15 0738 05/24/15 0300 05/25/15 0320 05/26/15 0400  WBC 10.4 9.6 8.9 8.1 7.6  HGB 13.5 12.1 12.0 11.8* 11.6*  HCT 38.8 35.6* 34.9* 34.0* 34.4*  MCV 90.0 91.8 91.6 91.4 90.8  PLT 213 178 195 185 194   Cardiac Enzymes:  Recent Labs Lab 05/22/15 2015 05/23/15 0135 05/23/15 0738  TROPONINI 1.09* 2.33* 2.83*   BNP (last 3 results) No results for input(s): BNP in the last 8760 hours.  ProBNP (last 3 results) No results for input(s): PROBNP in the last 8760 hours.  CBG:  Recent Labs Lab 05/25/15 0839 05/25/15 1059 05/25/15 1706 05/25/15 2034 05/26/15 0744  GLUCAP 339* 283* 198* 276* 174*    Recent Results (from the past 240 hour(s))  MRSA PCR Screening  Status: None   Collection Time: 05/22/15  9:05 PM  Result Value Ref Range Status   MRSA by PCR NEGATIVE NEGATIVE Final    Comment:        The GeneXpert MRSA Assay (FDA approved for NASAL specimens only), is one component of a comprehensive MRSA colonization surveillance program. It is not intended to diagnose MRSA infection nor to guide or monitor treatment for MRSA infections.      Studies: Dg Chest 2 View  05/25/2015   CLINICAL DATA:  Cough for 3-4 days.  EXAM: CHEST - 2 VIEW  COMPARISON:  Two-view chest x-ray 05/22/2015  FINDINGS: The heart is enlarged. Pulmonary vascular congestion has increased.  Linear atelectasis or scarring is present in the left lung. Small bilateral effusions have developed.  Median sternotomy for CABG is present. There is a stent within 1 of the vein grafts.  The visualized soft tissues and bony thorax are unremarkable.  IMPRESSION: 1. Cardiomegaly with increasing pulmonary vascular congestion and bilateral effusions suggesting congestive heart failure. 2. Linear atelectasis or scarring in the left mid lung is stable.   Electronically Signed   By: Marin Roberts M.D.   On: 05/25/2015 15:48    Scheduled Meds: . antiseptic oral rinse  7 mL Mouth Rinse BID  . aspirin EC  81 mg Oral Daily  . atorvastatin  80 mg Oral Daily  . clopidogrel  75 mg Oral Daily  . furosemide  40 mg Intravenous Q12H  . heparin  5,000 Units Subcutaneous 3 times per day  . insulin aspart  0-15 Units Subcutaneous TID WC  . insulin glargine  65 Units Subcutaneous QHS  . isosorbide mononitrate  30 mg Oral Daily  . latanoprost  1 drop Both Eyes QHS  . levothyroxine  137 mcg Oral QAC breakfast  . metoprolol tartrate  12.5 mg Oral BID  . mometasone-formoterol  2 puff Inhalation BID  . sodium chloride  3 mL Intravenous Q12H   Continuous Infusions:   Antibiotics Given (last 72 hours)    None      Principal Problem:   NSTEMI (non-ST elevated myocardial infarction) Active Problems:   CAD- CABG '03, DES to S-PDA, S-Dx 2/13, S-PDA 12/13/13   Type 2 diabetes mellitus, uncontrolled   Hypothyroidism   Hyperlipidemia   Hypertension   Morbid obesity   Gastroesophageal reflux disease   Chronic low back pain    Time spent:    [facility]  [facility] Pager [phone]. If 7PM-7AM, please contact night-coverage at [email or web address], password [facility] 05/26/2015, 10:24 AM  LOS: 4 days

## 2015-05-26 NOTE — Care Management Note (Addendum)
Case Management Note  Patient Details  Name: [name] MRN: [id number] Date of Birth: 12-18-1950  Subjective/Objective:  Pt admitted for Nstemi. Tweaking BP medications. Hopefully home soon.                  Action/Plan: No needs identified by CM at this time.    Expected Discharge Date:                  Expected Discharge Plan:  Home/Self Care  In-House Referral:     Discharge planning Services  CM Consult  Post Acute Care Choice:    Choice offered to:     DME Arranged:    DME Agency:     HH Arranged:    HH Agency:     Status of Service:  In process, will continue to follow  Medicare Important Message Given:  Yes-second notification given Date Medicare IM Given:    Medicare IM give by:    Date Additional Medicare IM Given:    Additional Medicare Important Message give by:     If discussed at Long Length of Stay Meetings, dates discussed:    Additional Comments: 0947 Late Entry: 05-28-15 Tomi Bamberger, RN,BSN [phone] Pt d/c home- no needs identified.   Gala Lewandowsky, RN 05/26/2015, 10:52 AM

## 2015-05-26 NOTE — Progress Notes (Signed)
CARDIAC REHAB PHASE I   PRE:  Rate/Rhythm: 85 SR    BP: sitting 112/66    SaO2: 95 RA  MODE:  Ambulation: 104 ft   POST:  Rate/Rhythm: 122 ST    BP: sitting 130/71     SaO2: 99 RA  Pt c/o being tired and weak, even with sitting in recliner. Sts she is only sleeping about 2 hrs a night. Denied CP. Pt very DOE with walking, began with leaving the room. HR elevated to 122. Had to sit at closest chair in room. HR calmed with rest. SaO2 ok. Pt sts she has been SOB for 6 months and that previously her GI MD told her her SOB is from bacteria in GI tract. Pt tried to do CRPII last year but sts her breathing was limiting and she had to quit. She is willing to try again, will send referral to [facility]. Ed completed, encouraging her to walk as tolerated and work on her diet. Life has been difficult since losing her husband but she recently moved next door to her son. Encouraged her to pursue grief counseling.  [id number]   [name] [place] CES, ACSM 05/26/2015 9:23 AM

## 2015-05-26 NOTE — Progress Notes (Addendum)
SUBJECTIVE:  Had an episode of feeling poorly with some mild chest pain last night and BP was borderline low.  Very SOB with ambulation which has been going on for 6 months.  No CP with ambulation this am.  OBJECTIVE:   Vitals:   Filed Vitals:   05/25/15 2352 05/26/15 0009 05/26/15 0510 05/26/15 0515  BP: 104/54  Pulse: 88 81 83   Temp:   98.3 F (36.8 C)   TempSrc:   Oral   Resp:      Height:      Weight:   271 lb (122.925 kg)   SpO2: 100% 100% 99%    I&O's:   Intake/Output Summary (Last 24 hours) at 05/26/15 0836 Last data filed at 05/25/15 1700  Gross per 24 hour  Intake    880 ml  Output    850 ml  Net     30 ml   TELEMETRY: Reviewed telemetry pt in NSR:     PHYSICAL EXAM General: Well developed, well nourished, in no acute distress Head: Eyes PERRLA, No xanthomas.   Normal cephalic and atramatic  Lungs:   Clear bilaterally to auscultation and percussion. Heart:   HRRR S1 S2 Pulses are 2+ & equal. Abdomen: Bowel sounds are positive, abdomen soft and non-tender without masses  Extremities:   No clubbing, cyanosis or edema.  DP +1 Neuro: Alert and oriented X 3. Psych:  Good affect, responds appropriately   LABS: Basic Metabolic Panel:  Recent Labs  16/10/96 0320 05/26/15 0400  NA 134* 132*  K 3.5 3.7  CL 98* 96*  CO2 25 26  GLUCOSE 303* 239*  BUN 14 10  CREATININE 0.74 0.60  CALCIUM 8.7* 8.6*   Liver Function Tests: No results for input(s): AST, ALT, ALKPHOS, BILITOT, PROT, ALBUMIN in the last 72 hours. No results for input(s): LIPASE, AMYLASE in the last 72 hours. CBC:  Recent Labs  05/25/15 0320 05/26/15 0400  WBC 8.1 7.6  HGB 11.8* 11.6*  HCT 34.0* 34.4*  MCV 91.4 90.8  PLT 185 194   Cardiac Enzymes: No results for input(s): CKTOTAL, CKMB, CKMBINDEX, TROPONINI in the last 72 hours. BNP: Invalid input(s): POCBNP D-Dimer: No results for input(s): DDIMER in the last 72 hours. Hemoglobin A1C: No results for input(s):  HGBA1C in the last 72 hours. Fasting Lipid Panel: No results for input(s): CHOL, HDL, LDLCALC, TRIG, CHOLHDL, LDLDIRECT in the last 72 hours. Thyroid Function Tests: No results for input(s): TSH, T4TOTAL, T3FREE, THYROIDAB in the last 72 hours.  Invalid input(s): FREET3 Anemia Panel: No results for input(s): VITAMINB12, FOLATE, FERRITIN, TIBC, IRON, RETICCTPCT in the last 72 hours. Coag Panel:   Lab Results  Component Value Date   INR 1.26 05/23/2015   INR 1.06 12/11/2013   INR 0.95 10/12/2011    RADIOLOGY: Dg Chest 2 View  05/25/2015   CLINICAL DATA:  Cough for 3-4 days.  EXAM: CHEST - 2 VIEW  COMPARISON:  Two-view chest x-ray 05/22/2015  FINDINGS: The heart is enlarged. Pulmonary vascular congestion has increased. Linear atelectasis or scarring is present in the left lung. Small bilateral effusions have developed.  Median sternotomy for CABG is present. There is a stent within 1 of the vein grafts.  The visualized soft tissues and bony thorax are unremarkable.  IMPRESSION: 1. Cardiomegaly with increasing pulmonary vascular congestion and bilateral effusions suggesting congestive heart failure. 2. Linear atelectasis or scarring in the left mid lung is stable.   Electronically Signed  By: Marin Roberts M.D.   On: 05/25/2015 15:48   Dg Chest 2 View  05/22/2015   CLINICAL DATA:  64 year old female with shortness of breath  EXAM: CHEST  2 VIEW  COMPARISON:  Radiograph dated 12/10/2013  FINDINGS: There is stable cardiomegaly. Median sternotomy wires CABG vascular clips and coronary stents noted. Stable prominence of the vasculature is suggestive of a degree of congestion. There is no focal consolidation, pleural effusion, or pneumothorax. There are emphysematous changes of the lungs. Degenerative changes of the spine. No acute fracture.  IMPRESSION: No active cardiopulmonary disease.  No interval change.   Electronically Signed   By: Elgie Collard M.D.   On: 05/22/2015 19:22     ASSESSMENT AND PLAN Principal Problem:  NSTEMI (non-ST elevated myocardial infarction) Active Problems:  CAD- CABG '03, DES to S-PDA, S-Dx 2/13, S-PDA 12/13/13  Type 2 diabetes mellitus, uncontrolled  Hypothyroidism  Hyperlipidemia  Hypertension  Morbid obesity  Gastroesophageal reflux disease  Chronic low back pain  1. NSTEMI The patient has extensive nonrevascularizable CAD -lifestyle modification was discussed Continue aspirin, Plavix, statin, nitrates and BB as bp allows 2-D echocardiogram demonstrates mild left ventricular systolic dysfunction with ejection fraction 40-45% and anterior wall motion abnormalities She is currently on PO Lasix.Had an episode of mild hypotension last night with dizziness.   Blood pressure is soft this am. Her weight is down 9 pounds. She appears to be euvolemic now. I will hold Lasix this am.   She is very SOB with ambulation which I suspect is due to morbid obesity with obesity hypoventilation syndrome.  O2 sats are 99% on RA after ambulation.  Soft BP prohibits any further titration of antianginals.  2. Uncontrolled diabetes Per primary team Outpatient nutrition consult would be beneficial  3. Hypertension Blood pressure soft.   4. HL On high dose statin  5. morbid obesity  Lifestyle modification encouraged      Quintella Reichert, MD  05/26/2015  8:36 AM

## 2015-05-27 LAB — CBC
HCT: 37.2 % (ref 36.0–46.0)
Hemoglobin: 12.7 g/dL (ref 12.0–15.0)
MCH: 30.8 pg (ref 26.0–34.0)
MCHC: 34.1 g/dL (ref 30.0–36.0)
MCV: 90.3 fL (ref 78.0–100.0)
PLATELETS: 236 10*3/uL (ref 150–400)
RBC: 4.12 MIL/uL (ref 3.87–5.11)
RDW: 12.7 % (ref 11.5–15.5)
WBC: 7.9 10*3/uL (ref 4.0–10.5)

## 2015-05-27 LAB — BASIC METABOLIC PANEL
ANION GAP: 10 (ref 5–15)
BUN: 13 mg/dL (ref 6–20)
CALCIUM: 8.8 mg/dL — AB (ref 8.9–10.3)
CO2: 27 mmol/L (ref 22–32)
Chloride: 98 mmol/L — ABNORMAL LOW (ref 101–111)
Creatinine, Ser: 0.66 mg/dL (ref 0.44–1.00)
GFR calc Af Amer: >60 mL/min (ref >60)
GLUCOSE: 136 mg/dL — AB (ref 65–99)
POTASSIUM: 3.7 mmol/L (ref 3.5–5.1)
SODIUM: 135 mmol/L (ref 135–145)

## 2015-05-27 LAB — GLUCOSE, CAPILLARY
GLUCOSE-CAPILLARY: 138 mg/dL — AB (ref 65–99)
GLUCOSE-CAPILLARY: 148 mg/dL — AB (ref 65–99)
GLUCOSE-CAPILLARY: 246 mg/dL — AB (ref 65–99)
GLUCOSE-CAPILLARY: 165 mg/dL — AB (ref 65–99)

## 2015-05-27 MED ORDER — ISOSORBIDE MONONITRATE ER 30 MG PO TB24
15.0000 mg | ORAL_TABLET | Freq: Every day | ORAL | Status: DC
  Administered 2015-05-28: 15 mg via ORAL
  Filled 2015-05-27: qty 1

## 2015-05-27 MED ORDER — FUROSEMIDE 40 MG PO TABS
40.0000 mg | ORAL_TABLET | Freq: Every day | ORAL | Status: DC
  Administered 2015-05-27: 40 mg via ORAL
  Filled 2015-05-27 (×2): qty 1

## 2015-05-27 NOTE — Progress Notes (Signed)
SUBJECTIVE:  Feels much better with less SOB.  Still SOB with ambulation  OBJECTIVE:   Vitals:   Filed Vitals:   05/26/15 2029 05/26/15 2047 05/26/15 2203 05/27/15 0500  BP:   107/58 100/58  Pulse: 88   78  Temp: 98.2 F (36.8 C)   98 F (36.7 C)  TempSrc: Oral   Oral  Resp:      Height:      Weight:      SpO2: 97% 98%  95%   I&O's:   Intake/Output Summary (Last 24 hours) at 05/27/15 0806 Last data filed at 05/27/15 0549  Gross per 24 hour  Intake    360 ml  Output   2100 ml  Net  -1740 ml   TELEMETRY: Reviewed telemetry pt in NSR:     PHYSICAL EXAM General: Well developed, well nourished, in no acute distress Head: Eyes PERRLA, No xanthomas.   Normal cephalic and atramatic  Lungs:   Clear bilaterally to auscultation and percussion. Heart:   HRRR S1 S2 Pulses are 2+ & equal. Abdomen: Bowel sounds are positive, abdomen soft and non-tender without masses Extremities:   No clubbing, cyanosis or edema.  DP +1 Neuro: Alert and oriented X 3. Psych:  Good affect, responds appropriately   LABS: Basic Metabolic Panel:  Recent Labs  23/34/35 0400 05/27/15 0327  NA 132* 135  K 3.7 3.7  CL 96* 98*  CO2 26 27  GLUCOSE 239* 136*  BUN 10 13  CREATININE 0.60 0.66  CALCIUM 8.6* 8.8*   Liver Function Tests: No results for input(s): AST, ALT, ALKPHOS, BILITOT, PROT, ALBUMIN in the last 72 hours. No results for input(s): LIPASE, AMYLASE in the last 72 hours. CBC:  Recent Labs  05/26/15 0400 05/27/15 0327  WBC 7.6 7.9  HGB 11.6* 12.7  HCT 34.4* 37.2  MCV 90.8 90.3  PLT 194 236   Cardiac Enzymes: No results for input(s): CKTOTAL, CKMB, CKMBINDEX, TROPONINI in the last 72 hours. BNP: Invalid input(s): POCBNP D-Dimer: No results for input(s): DDIMER in the last 72 hours. Hemoglobin A1C:  Recent Labs  05/24/15 1151  HGBA1C 11.7*   Fasting Lipid Panel: No results for input(s): CHOL, HDL, LDLCALC, TRIG, CHOLHDL, LDLDIRECT in the last 72 hours. Thyroid  Function Tests: No results for input(s): TSH, T4TOTAL, T3FREE, THYROIDAB in the last 72 hours.  Invalid input(s): FREET3 Anemia Panel: No results for input(s): VITAMINB12, FOLATE, FERRITIN, TIBC, IRON, RETICCTPCT in the last 72 hours. Coag Panel:   Lab Results  Component Value Date   INR 1.26 05/23/2015   INR 1.06 12/11/2013   INR 0.95 10/12/2011    RADIOLOGY: Dg Chest 2 View  05/25/2015   CLINICAL DATA:  Cough for 3-4 days.  EXAM: CHEST - 2 VIEW  COMPARISON:  Two-view chest x-ray 05/22/2015  FINDINGS: The heart is enlarged. Pulmonary vascular congestion has increased. Linear atelectasis or scarring is present in the left lung. Small bilateral effusions have developed.  Median sternotomy for CABG is present. There is a stent within 1 of the vein grafts.  The visualized soft tissues and bony thorax are unremarkable.  IMPRESSION: 1. Cardiomegaly with increasing pulmonary vascular congestion and bilateral effusions suggesting congestive heart failure. 2. Linear atelectasis or scarring in the left mid lung is stable.   Electronically Signed   By: Marin Roberts M.D.   On: 05/25/2015 15:48   Dg Chest 2 View  05/22/2015   CLINICAL DATA:  64 year old female with shortness of breath  EXAM:  CHEST  2 VIEW  COMPARISON:  Radiograph dated 12/10/2013  FINDINGS: There is stable cardiomegaly. Median sternotomy wires CABG vascular clips and coronary stents noted. Stable prominence of the vasculature is suggestive of a degree of congestion. There is no focal consolidation, pleural effusion, or pneumothorax. There are emphysematous changes of the lungs. Degenerative changes of the spine. No acute fracture.  IMPRESSION: No active cardiopulmonary disease.  No interval change.   Electronically Signed   By: Elgie Collard M.D.   On: 05/22/2015 19:22      ASSESSMENT AND PLAN Principal Problem:  NSTEMI (non-ST elevated myocardial infarction) Active Problems:  CAD- CABG '03, DES to S-PDA, S-Dx 2/13, S-PDA  12/13/13  Type 2 diabetes mellitus, uncontrolled  Hypothyroidism  Hyperlipidemia  Hypertension  Morbid obesity  Gastroesophageal reflux disease  Chronic low back pain  1. NSTEMI The patient has extensive nonrevascularizable CAD -lifestyle modification was discussed Continue aspirin, Plavix, statin, nitrates and BB as bp allows 2-D echocardiogram demonstrates mild left ventricular systolic dysfunction with ejection fraction 40-45% and anterior wall motion abnormalities  Blood pressure is soft this am. Her weight is down 9 pounds and net neg 3.7L out.O2 sats are 99% on RA after ambulation. BNP yesterday mildly elevated at 506 and CXRAY showed CHF.  She is still SOB with ambulation but much improved.  I will start her on Lasix  PO daily.    2. Uncontrolled diabetes Per primary team Outpatient nutrition consult would be beneficial  3. Hypertension Blood pressure soft.   4. HL On high dose statin  5. morbid obesity  Lifestyle modification encouraged  Quintella Reichert, MD  05/27/2015  8:06 AM

## 2015-05-27 NOTE — Progress Notes (Signed)
[facility] PROGRESS NOTE  [name]:[id number] DOB: [birth year] DOA: 05/22/2015 PCP: Lubertha South, MD  Assessment/Plan: NSTEMI -Continue Aspirin, statin,  low dose BB -Cardiology following, s/p left heart cath 9/9:  occlusion of the SVG to diagonal, other grafts patent, plan to Rx medically -2-D echocardiogram with EF of 45%, drop from before -imdur dose decreased further due to soft BP -chest pain is improved  Acute systolic CHF -much improved, responded well to 2doses of IV lasix yesterday -negative 3.7L -EF is 45% -changed to po lasix per Cards today  Uncontrolled diabetes -Was started on insulin drip on admission 9/9, CBGs poorly controlled, no DKA -Resumed Lantus at full dose 9/10, continue  novolog with meals, sliding scale insulin -CBgs improved, continue current regimen  History of CAD status post CABG in 2003 and DES to SVG-PDA in 4/15 -non revascularizeable disease on LHC -As in #1  Hypertension -Blood pressure still soft but improved, continue beta blocker  -lasix restarted, imdur dose cut down  GERD -PPI  Morbid obesity   DVT prophylaxis : Hep SQ  Code  Status: Full code  Family Communi: family at bedside  Disposition: home tomorrow stable  Consultants:  Cardiology  HPI/Subjective: Breathing much better, diuresed well yesterday  Objective: Filed Vitals:   05/27/15 0903  BP: 107/42  Pulse: 84  Temp:   Resp:     Intake/Output Summary (Last 24 hours) at 05/27/15 1048 Last data filed at 05/27/15 0840  Gross per 24 hour  Intake    420 ml  Output   2100 ml  Net  -1680 ml   Filed Weights   05/22/15 2100 05/24/15 1900 05/26/15 0510  Weight: 124 kg (273 lb 5.9 oz) 123.061 kg (271 lb 4.8 oz) 122.925 kg (271 lb)    Exam:   General:  AAOx3, no distress  Cardiovascular: S1S2/RRR  Respiratory: Clear today  Abdomen: soft, NT, BS present  Musculoskeletal: trace edema c/c   Data Reviewed: Basic Metabolic Panel:  Recent  Labs Lab 05/23/15 0738 05/24/15 0300 05/25/15 0320 05/26/15 0400 05/27/15 0327  NA 137 133* 134* 132* 135  K 3.8 3.4* 3.5 3.7 3.7  CL 104 100* 98* 96* 98*  CO2 GLUCOSE 153* 215* 303* 239* 136*  BUN CREATININE 0.60 0.60 0.74 0.60 0.66  CALCIUM 8.3* 8.2* 8.7* 8.6* 8.8*   Liver Function Tests: No results for input(s): AST, ALT, ALKPHOS, BILITOT, PROT, ALBUMIN in the last 168 hours. No results for input(s): LIPASE, AMYLASE in the last 168 hours. No results for input(s): AMMONIA in the last 168 hours. CBC:  Recent Labs Lab 05/23/15 0738 05/24/15 0300 05/25/15 0320 05/26/15 0400 05/27/15 0327  WBC 9.6 8.9 8.1 7.6 7.9  HGB 12.1 12.0 11.8* 11.6* 12.7  HCT 35.6* 34.9* 34.0* 34.4* 37.2  MCV 91.8 91.6 91.4 90.8 90.3  PLT 178 195 185 194 236   Cardiac Enzymes:  Recent Labs Lab 05/22/15 2015 05/23/15 0135 05/23/15 0738  TROPONINI 1.09* 2.33* 2.83*   BNP (last 3 results)  Recent Labs  05/26/15 0400  BNP 506.6*    ProBNP (last 3 results) No results for input(s): PROBNP in the last 8760 hours.  CBG:  Recent Labs Lab 05/26/15 0744 05/26/15 1137 05/26/15 1626 05/26/15 2028 05/27/15 0735  GLUCAP 174* 247* 219* 202* 138*    Recent Results (from the past 240 hour(s))  MRSA PCR Screening     Status: None   Collection Time: 05/22/15  9:05 PM  Result Value Ref Range Status   MRSA by PCR NEGATIVE NEGATIVE Final    Comment:        The GeneXpert MRSA Assay (FDA approved for NASAL specimens only), is one component of a comprehensive MRSA colonization surveillance program. It is not intended to diagnose MRSA infection nor to guide or monitor treatment for MRSA infections.      Studies: Dg Chest 2 View  05/25/2015   CLINICAL DATA:  Cough for 3-4 days.  EXAM: CHEST - 2 VIEW  COMPARISON:  Two-view chest x-ray 05/22/2015  FINDINGS: The heart is enlarged. Pulmonary vascular congestion has increased. Linear atelectasis or scarring is  present in the left lung. Small bilateral effusions have developed.  Median sternotomy for CABG is present. There is a stent within 1 of the vein grafts.  The visualized soft tissues and bony thorax are unremarkable.  IMPRESSION: 1. Cardiomegaly with increasing pulmonary vascular congestion and bilateral effusions suggesting congestive heart failure. 2. Linear atelectasis or scarring in the left mid lung is stable.   Electronically Signed   By: Marin Roberts M.D.   On: 05/25/2015 15:48    Scheduled Meds: . antiseptic oral rinse  7 mL Mouth Rinse BID  . aspirin EC  81 mg Oral Daily  . atorvastatin  80 mg Oral Daily  . clopidogrel  75 mg Oral Daily  . furosemide  40 mg Oral Daily  . heparin  5,000 Units Subcutaneous 3 times per day  . insulin aspart  0-15 Units Subcutaneous TID WC  . insulin glargine  65 Units Subcutaneous QHS  . [START ON 05/28/2015] isosorbide mononitrate  15 mg Oral Daily  . latanoprost  1 drop Both Eyes QHS  . levothyroxine  137 mcg Oral QAC breakfast  . metoprolol tartrate  12.5 mg Oral BID  . mometasone-formoterol  2 puff Inhalation BID  . potassium chloride  40 mEq Oral BID  . sodium chloride  3 mL Intravenous Q12H   Continuous Infusions:   Antibiotics Given (last 72 hours)    None      Principal Problem:   NSTEMI (non-ST elevated myocardial infarction) Active Problems:   CAD- CABG '03, DES to S-PDA, S-Dx 2/13, S-PDA 12/13/13   Type 2 diabetes mellitus, uncontrolled   Hypothyroidism   Hyperlipidemia   Hypertension   Morbid obesity   Gastroesophageal reflux disease   Chronic low back pain    Time spent:    [facility]  [facility] Pager [phone]. If 7PM-7AM, please contact night-coverage at [email or web address], password [facility] 05/27/2015, 10:48 AM  LOS: 5 days

## 2015-05-27 NOTE — Progress Notes (Signed)
Inpatient Diabetes Program Recommendations  AACE/ADA: New Consensus Statement on Inpatient Glycemic Control (2013)  Target Ranges:  Prepandial:   less than 140 mg/dL      Peak postprandial:   less than 180 mg/dL (1-2 hours)      Critically ill patients:  140 - 180 mg/dL   Results for [name], [name] (MRN [id number]) as of 05/27/2015 10:44  Ref. Range 05/26/2015 07:44 05/26/2015 11:37 05/26/2015 16:26 05/26/2015 20:28  Glucose-Capillary Latest Ref Range: 65-99 mg/dL 718 (H) 550 (H) 158 (H) 202 (H)    Results for [name], [name] (MRN [id number]) as of 05/27/2015 10:44  Ref. Range 05/27/2015 07:35  Glucose-Capillary Latest Ref Range: 65-99 mg/dL 521 (H)    Home DM Meds: Lantus 60 units QHS       Novolog 15-40 units tid per SSI  Current DM Orders: Lantus 65 units QHS            Novolog Moderate SSI (0-15 units) TID AC     -Note Lantus increased to 65 units QHS on 09/11.  Fasting glucose much improved today: 138 mg/dl.  -Patient eating 747% of meals.  -Having elevated postprandial glucose levels.    MD- Please consider starting Novolog Meal Coverage-  Novolog 4 units tid with meals     Will follow Ambrose Finland RN, MSN, CDE Diabetes Coordinator Inpatient Glycemic Control Team Team Pager: [phone] (8a-5p)

## 2015-05-27 NOTE — Progress Notes (Signed)
CARDIAC REHAB PHASE I   PRE:  Rate/Rhythm: 80 SR    BP: sitting 90/52    SaO2:   MODE:  Ambulation: 150 ft   POST:  Rate/Rhythm: 105 ST    BP: sitting 110/89     SaO2:   Pt c/o back pain today but SOB much improved. She was able to walk around circle with increased effort, tired and SOB toward end. Her SOB was much less than yesterday and her HR only up to 105 (122 ST yest). BP 90/52 before walk but increased with exercise. No questions.  [id number]   Elissa Lovett [place] CES, ACSM 05/27/2015 10:33 AM

## 2015-05-28 DIAGNOSIS — M545 Low back pain: Secondary | ICD-10-CM

## 2015-05-28 DIAGNOSIS — E1165 Type 2 diabetes mellitus with hyperglycemia: Secondary | ICD-10-CM

## 2015-05-28 DIAGNOSIS — G8929 Other chronic pain: Secondary | ICD-10-CM

## 2015-05-28 LAB — BASIC METABOLIC PANEL
ANION GAP: 11 (ref 5–15)
BUN: 13 mg/dL (ref 6–20)
CHLORIDE: 100 mmol/L — AB (ref 101–111)
CO2: 23 mmol/L (ref 22–32)
Calcium: 9.1 mg/dL (ref 8.9–10.3)
Creatinine, Ser: 0.63 mg/dL (ref 0.44–1.00)
Glucose, Bld: 138 mg/dL — ABNORMAL HIGH (ref 65–99)
POTASSIUM: 4.1 mmol/L (ref 3.5–5.1)
SODIUM: 134 mmol/L — AB (ref 135–145)

## 2015-05-28 LAB — GLUCOSE, CAPILLARY: GLUCOSE-CAPILLARY: 100 mg/dL — AB (ref 65–99)

## 2015-05-28 MED ORDER — FUROSEMIDE 80 MG PO TABS: 40.0000 mg | ORAL_TABLET | Freq: Every day | ORAL | Status: AC

## 2015-05-28 MED ORDER — FUROSEMIDE 80 MG PO TABS: 40.0000 mg | ORAL_TABLET | Freq: Every day | ORAL | Status: DC

## 2015-05-28 MED ORDER — ISOSORBIDE MONONITRATE ER 30 MG PO TB24: 15.0000 mg | ORAL_TABLET | Freq: Every day | ORAL | Status: AC

## 2015-05-28 MED ORDER — CLOPIDOGREL BISULFATE 75 MG PO TABS: 75.0000 mg | ORAL_TABLET | Freq: Every day | ORAL | Status: AC

## 2015-05-28 NOTE — Progress Notes (Signed)
CARDIAC REHAB PHASE I   PT declined walking this am. Sts her back hurts but she will walk at home. Discussed Hgb A1C and controlling CBGs. Pt admits to not taking her DM meds that her endocrinologist prescribed stating it made her heart feel funny. Encouraged her to make an appointment this week to discussed meds. Her Hgb A1c has been elevated for years according to Epic. Pt receptive, daughter present.  [phone]  [name] [place] CES, ACSM 05/28/2015 8:49 AM

## 2015-05-28 NOTE — Progress Notes (Signed)
[facility] PROGRESS NOTE  [name] JWJ:[id number] DOB: 23-Feb-1951 DOA: 05/22/2015 PCP: Karla South, MD  Assessment/Plan: Stable for discharge continue current lasix dose. See Dr. Edison [name] discharge summary   Consultants:  Cardiology  HPI/Subjective: C/o shoulder pain. Tylenol helped. No other complaints  Objective: Filed Vitals:   05/28/15 0500  BP: 117/60  Pulse: 89  Temp: 98.1 F (36.7 C)  Resp:     Intake/Output Summary (Last 24 hours) at 05/28/15 0831 Last data filed at 05/28/15 0500  Gross per 24 hour  Intake    420 ml  Output   1650 ml  Net  -1230 ml   Filed Weights   05/24/15 1900 05/26/15 0510 05/28/15 0500  Weight: 123.061 kg (271 lb 4.8 oz) 122.925 kg (271 lb) 122.199 kg (269 lb 6.4 oz)    Exam:   General:  AAOx3, no distress  Cardiovascular: S1S2/RRR  Respiratory: Clear today  Abdomen: soft, NT, BS present  Musculoskeletal: trace edema c/c   Data Reviewed: Basic Metabolic Panel:  Recent Labs Lab 05/24/15 0300 05/25/15 0320 05/26/15 0400 05/27/15 0327 05/28/15 0010  NA 133* 134* 132* 135 134*  K 3.4* 3.5 3.7 3.7 4.1  CL 100* 98* 96* 98* 100*  CO2 GLUCOSE 215* 303* 239* 136* 138*  BUN CREATININE 0.60 0.74 0.60 0.66 0.63  CALCIUM 8.2* 8.7* 8.6* 8.8* 9.1   Liver Function Tests: No results for input(s): AST, ALT, ALKPHOS, BILITOT, PROT, ALBUMIN in the last 168 hours. No results for input(s): LIPASE, AMYLASE in the last 168 hours. No results for input(s): AMMONIA in the last 168 hours. CBC:  Recent Labs Lab 05/23/15 0738 05/24/15 0300 05/25/15 0320 05/26/15 0400 05/27/15 0327  WBC 9.6 8.9 8.1 7.6 7.9  HGB 12.1 12.0 11.8* 11.6* 12.7  HCT 35.6* 34.9* 34.0* 34.4* 37.2  MCV 91.8 91.6 91.4 90.8 90.3  PLT 178 195 185 194 236   Cardiac Enzymes:  Recent Labs Lab 05/22/15 2015 05/23/15 0135 05/23/15 0738  TROPONINI 1.09* 2.33* 2.83*   BNP (last 3 results)  Recent Labs   05/26/15 0400  BNP 506.6*    ProBNP (last 3 results) No results for input(s): PROBNP in the last 8760 hours.  CBG:  Recent Labs Lab 05/27/15 0735 05/27/15 1129 05/27/15 1631 05/27/15 2101 05/28/15 0741  GLUCAP 138* 148* 246* 165* 100*    Recent Results (from the past 240 hour(s))  MRSA PCR Screening     Status: None   Collection Time: 05/22/15  9:05 PM  Result Value Ref Range Status   MRSA by PCR NEGATIVE NEGATIVE Final    Comment:        The GeneXpert MRSA Assay (FDA approved for NASAL specimens only), is one component of a comprehensive MRSA colonization surveillance program. It is not intended to diagnose MRSA infection nor to guide or monitor treatment for MRSA infections.      Studies: No results found.  Scheduled Meds: . antiseptic oral rinse  7 mL Mouth Rinse BID  . aspirin EC  81 mg Oral Daily  . atorvastatin  80 mg Oral Daily  . clopidogrel  75 mg Oral Daily  . furosemide  40 mg Oral Daily  . heparin  5,000 Units Subcutaneous 3 times per day  . insulin aspart  0-15 Units Subcutaneous TID WC  . insulin glargine  65 Units Subcutaneous QHS  . isosorbide mononitrate  15 mg Oral Daily  . latanoprost  1  drop Both Eyes QHS  . levothyroxine  137 mcg Oral QAC breakfast  . metoprolol tartrate  12.5 mg Oral BID  . mometasone-formoterol  2 puff Inhalation BID  . potassium chloride  40 mEq Oral BID  . sodium chloride  3 mL Intravenous Q12H   Continuous Infusions:   Antibiotics Given (last 72 hours)    None      Principal Problem:   NSTEMI (non-ST elevated myocardial infarction) Active Problems:   CAD- CABG '03, DES to S-PDA, S-Dx 2/13, S-PDA 12/13/13   Type 2 diabetes mellitus, uncontrolled   Hypothyroidism   Hyperlipidemia   Hypertension   Morbid obesity   Gastroesophageal reflux disease   Chronic low back pain    Time spent:    [name] L  [facility] [email or web address], password [name] 05/28/2015, 8:31 AM  LOS: 6 days

## 2015-05-28 NOTE — Progress Notes (Signed)
SUBJECTIVE:  No complaints - ready to go home  OBJECTIVE:   Vitals:   Filed Vitals:   05/27/15 1900 05/27/15 2059 05/28/15 0500 05/28/15 0759  BP:  99/48 117/60   Pulse:  83 89   Temp:  98.3 F (36.8 C) 98.1 F (36.7 C)   TempSrc:  Oral Oral   Resp:      Height:      Weight:   269 lb 6.4 oz (122.199 kg)   SpO2: 98% 97% 99% 98%   I&O's:   Intake/Output Summary (Last 24 hours) at 05/28/15 0814 Last data filed at 05/28/15 0500  Gross per 24 hour  Intake    420 ml  Output   1650 ml  Net  -1230 ml   TELEMETRY: Reviewed telemetry pt in NSR with PVC's:     PHYSICAL EXAM General: Well developed, well nourished, in no acute distress Head: Eyes PERRLA, No xanthomas.   Normal cephalic and atramatic  Lungs:Clear bilaterally to auscultation and percussion. Heart:   HRRR S1 S2 Pulses are 2+ & equal.            No carotid bruit. No JVD.  No abdominal bruits. No femoral bruits. Abdomen: Bowel sounds are positive, abdomen soft and non-tender without masses or                  Hernia's noted. Msk:  Back normal, normal gait. Normal strength and tone for age. Extremities:   No clubbing, cyanosis or edema.  DP +1 Neuro: Alert and oriented X 3. Psych:  Good affect, responds appropriately   LABS: Basic Metabolic Panel:  Recent Labs  16/10/96 0327 05/28/15 0010  NA 135 134*  K 3.7 4.1  CL 98* 100*  CO2 27 23  GLUCOSE 136* 138*  BUN 13 13  CREATININE 0.66 0.63  CALCIUM 8.8* 9.1   Liver Function Tests: No results for input(s): AST, ALT, ALKPHOS, BILITOT, PROT, ALBUMIN in the last 72 hours. No results for input(s): LIPASE, AMYLASE in the last 72 hours. CBC:  Recent Labs  05/26/15 0400 05/27/15 0327  WBC 7.6 7.9  HGB 11.6* 12.7  HCT 34.4* 37.2  MCV 90.8 90.3  PLT 194 236   Cardiac Enzymes: No results for input(s): CKTOTAL, CKMB, CKMBINDEX, TROPONINI in the last 72 hours. BNP: Invalid input(s): POCBNP D-Dimer: No results for input(s): DDIMER in the last 72  hours. Hemoglobin A1C: No results for input(s): HGBA1C in the last 72 hours. Fasting Lipid Panel: No results for input(s): CHOL, HDL, LDLCALC, TRIG, CHOLHDL, LDLDIRECT in the last 72 hours. Thyroid Function Tests: No results for input(s): TSH, T4TOTAL, T3FREE, THYROIDAB in the last 72 hours.  Invalid input(s): FREET3 Anemia Panel: No results for input(s): VITAMINB12, FOLATE, FERRITIN, TIBC, IRON, RETICCTPCT in the last 72 hours. Coag Panel:   Lab Results  Component Value Date   INR 1.26 05/23/2015   INR 1.06 12/11/2013   INR 0.95 10/12/2011    RADIOLOGY: Dg Chest 2 View  05/25/2015   CLINICAL DATA:  Cough for 3-4 days.  EXAM: CHEST - 2 VIEW  COMPARISON:  Two-view chest x-ray 05/22/2015  FINDINGS: The heart is enlarged. Pulmonary vascular congestion has increased. Linear atelectasis or scarring is present in the left lung. Small bilateral effusions have developed.  Median sternotomy for CABG is present. There is a stent within 1 of the vein grafts.  The visualized soft tissues and bony thorax are unremarkable.  IMPRESSION: 1. Cardiomegaly with increasing pulmonary vascular congestion and bilateral  effusions suggesting congestive heart failure. 2. Linear atelectasis or scarring in the left mid lung is stable.   Electronically Signed   By: Marin Roberts M.D.   On: 05/25/2015 15:48   Dg Chest 2 View  05/22/2015   CLINICAL DATA:  64 year old female with shortness of breath  EXAM: CHEST  2 VIEW  COMPARISON:  Radiograph dated 12/10/2013  FINDINGS: There is stable cardiomegaly. Median sternotomy wires CABG vascular clips and coronary stents noted. Stable prominence of the vasculature is suggestive of a degree of congestion. There is no focal consolidation, pleural effusion, or pneumothorax. There are emphysematous changes of the lungs. Degenerative changes of the spine. No acute fracture.  IMPRESSION: No active cardiopulmonary disease.  No interval change.   Electronically Signed   By: Elgie Collard M.D.   On: 05/22/2015 19:22   ASSESSMENT AND PLAN Principal Problem:  NSTEMI (non-ST elevated myocardial infarction) Active Problems:  CAD- CABG '03, DES to S-PDA, S-Dx 2/13, S-PDA 12/13/13  Type 2 diabetes mellitus, uncontrolled  Hypothyroidism  Hyperlipidemia  Hypertension  Morbid obesity  Gastroesophageal reflux disease  Chronic low back pain  1. NSTEMI The patient has extensive nonrevascularizable CAD -lifestyle modification was discussed Continue aspirin, Plavix, statin, nitrates and BB as bp allows 2-D echocardiogram demonstrates mild left ventricular systolic dysfunction with ejection fraction 40-45% and anterior wall motion abnormalities Blood pressure is soft this am. Her weight is down 11 pounds and net neg 5L out.O2 sats are 99% on RA after ambulation.   SOB but much improved.Continue Lasix 40mg  PO daily.   2. Uncontrolled diabetes Per primary team Outpatient nutrition consult would be beneficial  3. Hypertension BP stable  4. HL On high dose statin  5. morbid obesity  Lifestyle modification encouraged  No new recs.  Will sign off.  Call with any questions.  Patient should followup in office with Dr. Dene Gentry, MD  05/28/2015  8:14 AM

## 2015-05-28 NOTE — Discharge Summary (Signed)
Physician Discharge Summary  [name] ZOX:[id number] DOB: Sep 07, 1951 DOA: 05/22/2015  PCP: Lubertha South, MD  Admit date: 05/22/2015 Discharge date: 05/28/2015  Time spent: 45 minutes  Recommendations for Outpatient Follow-up:  1. Dr.Branch [facility] in [place] in 2 weeks  Discharge Diagnoses:  Principal Problem:   NSTEMI (non-ST elevated myocardial infarction) Active Problems:   CAD- CABG '03, DES to S-PDA, S-Dx 2/13, S-PDA 12/13/13   Type 2 diabetes mellitus, uncontrolled   Hypothyroidism   Hyperlipidemia   Hypertension   Morbid obesity   Gastroesophageal reflux disease   Chronic low back pain   Discharge Condition: stable Diet recommendation: low sodium, diabetic  Filed Weights   05/24/15 1900 05/26/15 0510 05/28/15 0500  Weight: 123.061 kg (271 lb 4.8 oz) 122.925 kg (271 lb) 122.199 kg (269 lb 6.4 oz)    History of present illness:  64 y/o morbidly obese diabetic with a history of CAD, s/p CABG in '03. Her most recent cath was in April 2015 and she had an SVG-PDA DES then. On 9/8  she developed SSCP "like my heart attack" associated with nausea and diaphoresis. She took 4 NTG at home without relief. EMS gave her one NTG en route.   Hospital Course:  NSTEMI -noted on admission, treated with Iv heparin, Continue Aspirin, statin, low dose BB -Followed by Cardiology , s/p left heart cath 9/9: occlusion of the SVG to diagonal, other grafts patent, plan to Rx medically -2-D echocardiogram with EF of 45%, drop from before -imdur dose decreased further due to soft BP -chest pain is improved  Acute systolic CHF -diuresed with IV lasix and improved -negative 3.7L -EF is 45% -changed to po lasix per Cardiology to 40mg   Uncontrolled diabetes -Was started on insulin drip on admission 9/9, CBGs poorly controlled, no DKA -Resumed Lantus at full dose 9/10, continue novolog with meals, sliding scale insulin -CBgs improved, resume home regimen  History of CAD status post CABG in  2003 and DES to SVG-PDA in 4/15 -non revascularizeable disease on LHC -As in #1  Hypertension -Blood pressure still soft but improved, continue beta blocker  -lasix restarted, imdur dose cut down  GERD -PPI  Morbid obesity   Procedures:  Left heart cath  Consultations:  Cardiology  Discharge Exam: Filed Vitals:   05/28/15 0500  BP: 117/60  Pulse: 89  Temp: 98.1 F (36.7 C)  Resp:     General:AAOx3 Cardiovascular: S1S2/RRR Respiratory:CTAB  Discharge Instructions   Discharge Instructions    Amb Referral to Cardiac Rehabilitation    Complete by:  As directed   Congestive Heart Failure: If diagnosis is Heart Failure, patient MUST meet each of the CMS criteria: 1. Left Ventricular Ejection Fraction </= 35% 2. NYHA class II-IV symptoms despite being on optimal heart failure therapy for at least 6 weeks. 3. Stable = have not had a recent (<6 weeks) or planned (<6 months) major cardiovascular hospitalization or procedure  Program Details: - Physician supervised classes - 1-3 classes per week over a 12-18 week period, generally for a total of 36 sessions  Physician Certification: I certify that the above Cardiac Rehabilitation treatment is medically necessary and is medically approved by me for treatment of this patient. The patient is willing and cooperative, able to ambulate and medically stable to participate in exercise rehabilitation. The participant's progress and Individualized Treatment Plan will be reviewed by the Medical Director, Cardiac Rehab staff and as indicated by the Referring/Ordering Physician.  Diagnosis:  Myocardial Infarction     Ambulatory  referral to Nutrition and Diabetic Education    Complete by:  As directed   Patient request.  Please send referral on to Norm Salt, CDE with Dr. Isidoro Donning Office in [place].  Thanks!          Current Discharge Medication List    START taking these medications   Details  clopidogrel (PLAVIX) 75 MG  tablet Take 1 tablet (75 mg total) by mouth daily. Qty: 30 tablet, Refills: 0    isosorbide mononitrate (IMDUR) 30 MG 24 hr tablet Take 0.5 tablets (15 mg total) by mouth daily. Qty: 30 tablet, Refills: 0      CONTINUE these medications which have CHANGED   Details  furosemide (LASIX) 80 MG tablet Take 0.5 tablets (40 mg total) by mouth daily. Qty: 1 tablet, Refills: 0      CONTINUE these medications which have NOT CHANGED   Details  albuterol (PROAIR HFA) 108 (90 BASE) MCG/ACT inhaler Inhale 3 puffs into the lungs every 4 (four) hours as needed for shortness of breath. Qty: 3.7 g, Refills: 5    ALPRAZolam (XANAX) 0.5 MG tablet Take 1 tablet (0.5 mg total) by mouth at bedtime as needed for anxiety. Qty: 30 tablet, Refills: 1    Ascorbic Acid (VITAMIN C) 1000 MG tablet Take 1,000 mg by mouth daily.    aspirin 81 MG tablet Take 1 tablet (81 mg total) by mouth daily.    atorvastatin (LIPITOR) 80 MG tablet Take 1 tablet (80 mg total) by mouth daily. Qty: 90 tablet, Refills: 3    diphenoxylate-atropine (LOMOTIL) 2.5-0.025 MG per tablet TAKE 1 TABLET 4 TIMES A DAY AS NEEDED FOR LOOSE BOWELS Qty: 120 tablet, Refills: 5    Fluticasone-Salmeterol (ADVAIR) 250-50 MCG/DOSE AEPB Inhale 1 puff into the lungs 2 (two) times daily as needed (shortness of breath/wheezing). Qty: 60 each, Refills: 5    insulin aspart (NOVOLOG) 100 UNIT/ML injection Inject 15-40 Units into the skin 3 times daily with meals, bedtime and 2 AM. Per sliding scale    insulin glargine (LANTUS) 100 UNIT/ML injection Inject 60 Units into the skin at bedtime.     latanoprost (XALATAN) 0.005 % ophthalmic solution Place 1 drop into both eyes at bedtime.    levothyroxine (SYNTHROID, LEVOTHROID) 137 MCG tablet Take 137 mcg by mouth daily.    metoprolol succinate (TOPROL-XL) 25 MG 24 hr tablet TAKE 1 TABLET TWICE DAILY Qty: 180 tablet, Refills: 1    nitroGLYCERIN (NITROSTAT) 0.4 MG SL tablet Place 1 tablet (0.4 mg total)  under the tongue every 5 (five) minutes as needed. For chest pain Qty: 25 tablet, Refills: 4    Omega-3 Fatty Acids (FISH OIL) 1000 MG CAPS Take 1,000 mg by mouth daily.    ONE TOUCH ULTRA TEST test strip 1 each by Other route 4 (four) times daily.     potassium chloride SA (K-DUR,KLOR-CON) 20 MEQ tablet TAKE 1 TABLET (20 MEQ TOTAL) BY MOUTH DAILY. Qty: 90 tablet, Refills: 0      STOP taking these medications     lisinopril (PRINIVIL,ZESTRIL) 2.5 MG tablet      pantoprazole (PROTONIX) 40 MG tablet        No Known Allergies    The results of significant diagnostics from this hospitalization (including imaging, microbiology, ancillary and laboratory) are listed below for reference.    Significant Diagnostic Studies: Dg Chest 2 View  05/25/2015   CLINICAL DATA:  Cough for 3-4 days.  EXAM: CHEST - 2 VIEW  COMPARISON:  Two-view  chest x-ray 05/22/2015  FINDINGS: The heart is enlarged. Pulmonary vascular congestion has increased. Linear atelectasis or scarring is present in the left lung. Small bilateral effusions have developed.  Median sternotomy for CABG is present. There is a stent within 1 of the vein grafts.  The visualized soft tissues and bony thorax are unremarkable.  IMPRESSION: 1. Cardiomegaly with increasing pulmonary vascular congestion and bilateral effusions suggesting congestive heart failure. 2. Linear atelectasis or scarring in the left mid lung is stable.   Electronically Signed   By: Marin Roberts M.D.   On: 05/25/2015 15:48   Dg Chest 2 View  05/22/2015   CLINICAL DATA:  65 year old female with shortness of breath  EXAM: CHEST  2 VIEW  COMPARISON:  Radiograph dated 12/10/2013  FINDINGS: There is stable cardiomegaly. Median sternotomy wires CABG vascular clips and coronary stents noted. Stable prominence of the vasculature is suggestive of a degree of congestion. There is no focal consolidation, pleural effusion, or pneumothorax. There are emphysematous changes of the  lungs. Degenerative changes of the spine. No acute fracture.  IMPRESSION: No active cardiopulmonary disease.  No interval change.   Electronically Signed   By: Elgie Collard M.D.   On: 05/22/2015 19:22    Microbiology: Recent Results (from the past 240 hour(s))  MRSA PCR Screening     Status: None   Collection Time: 05/22/15  9:05 PM  Result Value Ref Range Status   MRSA by PCR NEGATIVE NEGATIVE Final    Comment:        The GeneXpert MRSA Assay (FDA approved for NASAL specimens only), is one component of a comprehensive MRSA colonization surveillance program. It is not intended to diagnose MRSA infection nor to guide or monitor treatment for MRSA infections.      Labs: Basic Metabolic Panel:  Recent Labs Lab 05/24/15 0300 05/25/15 0320 05/26/15 0400 05/27/15 0327 05/28/15 0010  NA 133* 134* 132* 135 134*  K 3.4* 3.5 3.7 3.7 4.1  CL 100* 98* 96* 98* 100*  CO2 24 25 26 27 23   GLUCOSE 215* 303* 239* 136* 138*  BUN 7 14 10 13 13   CREATININE 0.60 0.74 0.60 0.66 0.63  CALCIUM 8.2* 8.7* 8.6* 8.8* 9.1   Liver Function Tests: No results for input(s): AST, ALT, ALKPHOS, BILITOT, PROT, ALBUMIN in the last 168 hours. No results for input(s): LIPASE, AMYLASE in the last 168 hours. No results for input(s): AMMONIA in the last 168 hours. CBC:  Recent Labs Lab 05/23/15 0738 05/24/15 0300 05/25/15 0320 05/26/15 0400 05/27/15 0327  WBC 9.6 8.9 8.1 7.6 7.9  HGB 12.1 12.0 11.8* 11.6* 12.7  HCT 35.6* 34.9* 34.0* 34.4* 37.2  MCV 91.8 91.6 91.4 90.8 90.3  PLT 178 195 185 194 236   Cardiac Enzymes:  Recent Labs Lab 05/22/15 2015 05/23/15 0135 05/23/15 0738  TROPONINI 1.09* 2.33* 2.83*   BNP: BNP (last 3 results)  Recent Labs  05/26/15 0400  BNP 506.6*    ProBNP (last 3 results) No results for input(s): PROBNP in the last 8760 hours.  CBG:  Recent Labs Lab 05/26/15 2028 05/27/15 0735 05/27/15 1129 05/27/15 1631 05/27/15 2101  GLUCAP 202* 138* 148*  246* 165*       Signed:  Vanna Sailer  [facility] 05/28/2015, 7:10 AM

## 2015-06-03 ENCOUNTER — Encounter: Payer: Self-pay | Admitting: Family Medicine

## 2015-06-03 ENCOUNTER — Ambulatory Visit: Payer: Medicare Other | Admitting: Family Medicine

## 2015-06-03 ENCOUNTER — Ambulatory Visit (INDEPENDENT_AMBULATORY_CARE_PROVIDER_SITE_OTHER): Payer: Medicare Other | Admitting: Family Medicine

## 2015-06-03 VITALS — BP 122/82 | Ht 64.0 in | Wt 270.0 lb

## 2015-06-03 DIAGNOSIS — I10 Essential (primary) hypertension: Secondary | ICD-10-CM

## 2015-06-03 DIAGNOSIS — I214 Non-ST elevation (NSTEMI) myocardial infarction: Secondary | ICD-10-CM

## 2015-06-03 DIAGNOSIS — E785 Hyperlipidemia, unspecified: Secondary | ICD-10-CM

## 2015-06-03 NOTE — Progress Notes (Signed)
   Subjective:    Patient ID: [name], female    DOB: Oct 21, 1950, 64 y.o.   MRN: [id number] Patient arrives office for transitional visit after discharge. HPI  Patient arrives for a follow up for recent hospitalization for MI. Patient had a non-ST elevation MI. Was in the hospital nearly for a week. Review of charts performed in presence of family.   was found to have diffuse serious multi-vessel heart disease. Has had bypass in the past. Next  States was feeling fullness and tightness in the chest associated with shortness of breath and nausea before she was admitted to the hospital. Next  Sadly patient just lost her husband last month significant discussion held in this regard. She feels this is been very stressful for her and a contributor to her difficulties.  Also admits to diabetes not being in the best control. States sugars were extremely high when she went to the hospital. Nearly 500 some days. Sugars were reviewed today much better in the low 100s.  New medications are reviewed today.  Patient is expressing no chest pain no shortness of breath she feels energized to get back to recuperation. No diaphoresis.  Fortunately one of her daughters is staying with her for a while  daighter now living  Needs walker with seat to for exercise    Review of Systems No headache no chest pain no shortness breath no abdominal pain no change in bowel habits no blood in stool    Objective:   Physical Exam  Alert active no acute distress vital stable HEENT good. Lungs clear. Heart regular in rhythm. Ankles no significant edema  Hospital records reviewed in presence of patient. Medications clarified      Assessment & Plan:  Impression 1 status post admission for non-ST elevated MI. Due to see cardiologist later this week. #2 hypertension good control discussed #3 type 2 diabetes poor control earlier but now improving discussed #4 challenging social situation discussed #5 in need  of walker with seat prescribed. Follow-up as already scheduled. We'll fill out FMLA for family member.

## 2015-06-04 ENCOUNTER — Telehealth: Payer: Self-pay | Admitting: Cardiology

## 2015-06-04 NOTE — Telephone Encounter (Signed)
Received call from patient's pcp Dr Lilyan Punt to inform our office that the patient unfortunately passed away last evening. Appears she passed out and became unresponsive, was not able to be resuscitated.   Dominga Ferry MD

## 2015-06-05 ENCOUNTER — Encounter: Payer: Medicare Other | Admitting: Adult Health

## 2015-06-14 DEATH — deceased

## 2015-07-04 ENCOUNTER — Ambulatory Visit: Payer: Medicare Other | Admitting: Nutrition

## 2015-08-01 ENCOUNTER — Ambulatory Visit: Payer: Medicare Other | Admitting: Family Medicine

## 2017-01-18 IMAGING — DX DG CHEST 2V
2 series · 2 of 2 positions shown · non-contrast
Comparison: Two-view chest x-ray 05/22/2015

CLINICAL DATA: Cough for 3-4 days.

EXAM:
CHEST - 2 VIEW

[w chest pa]
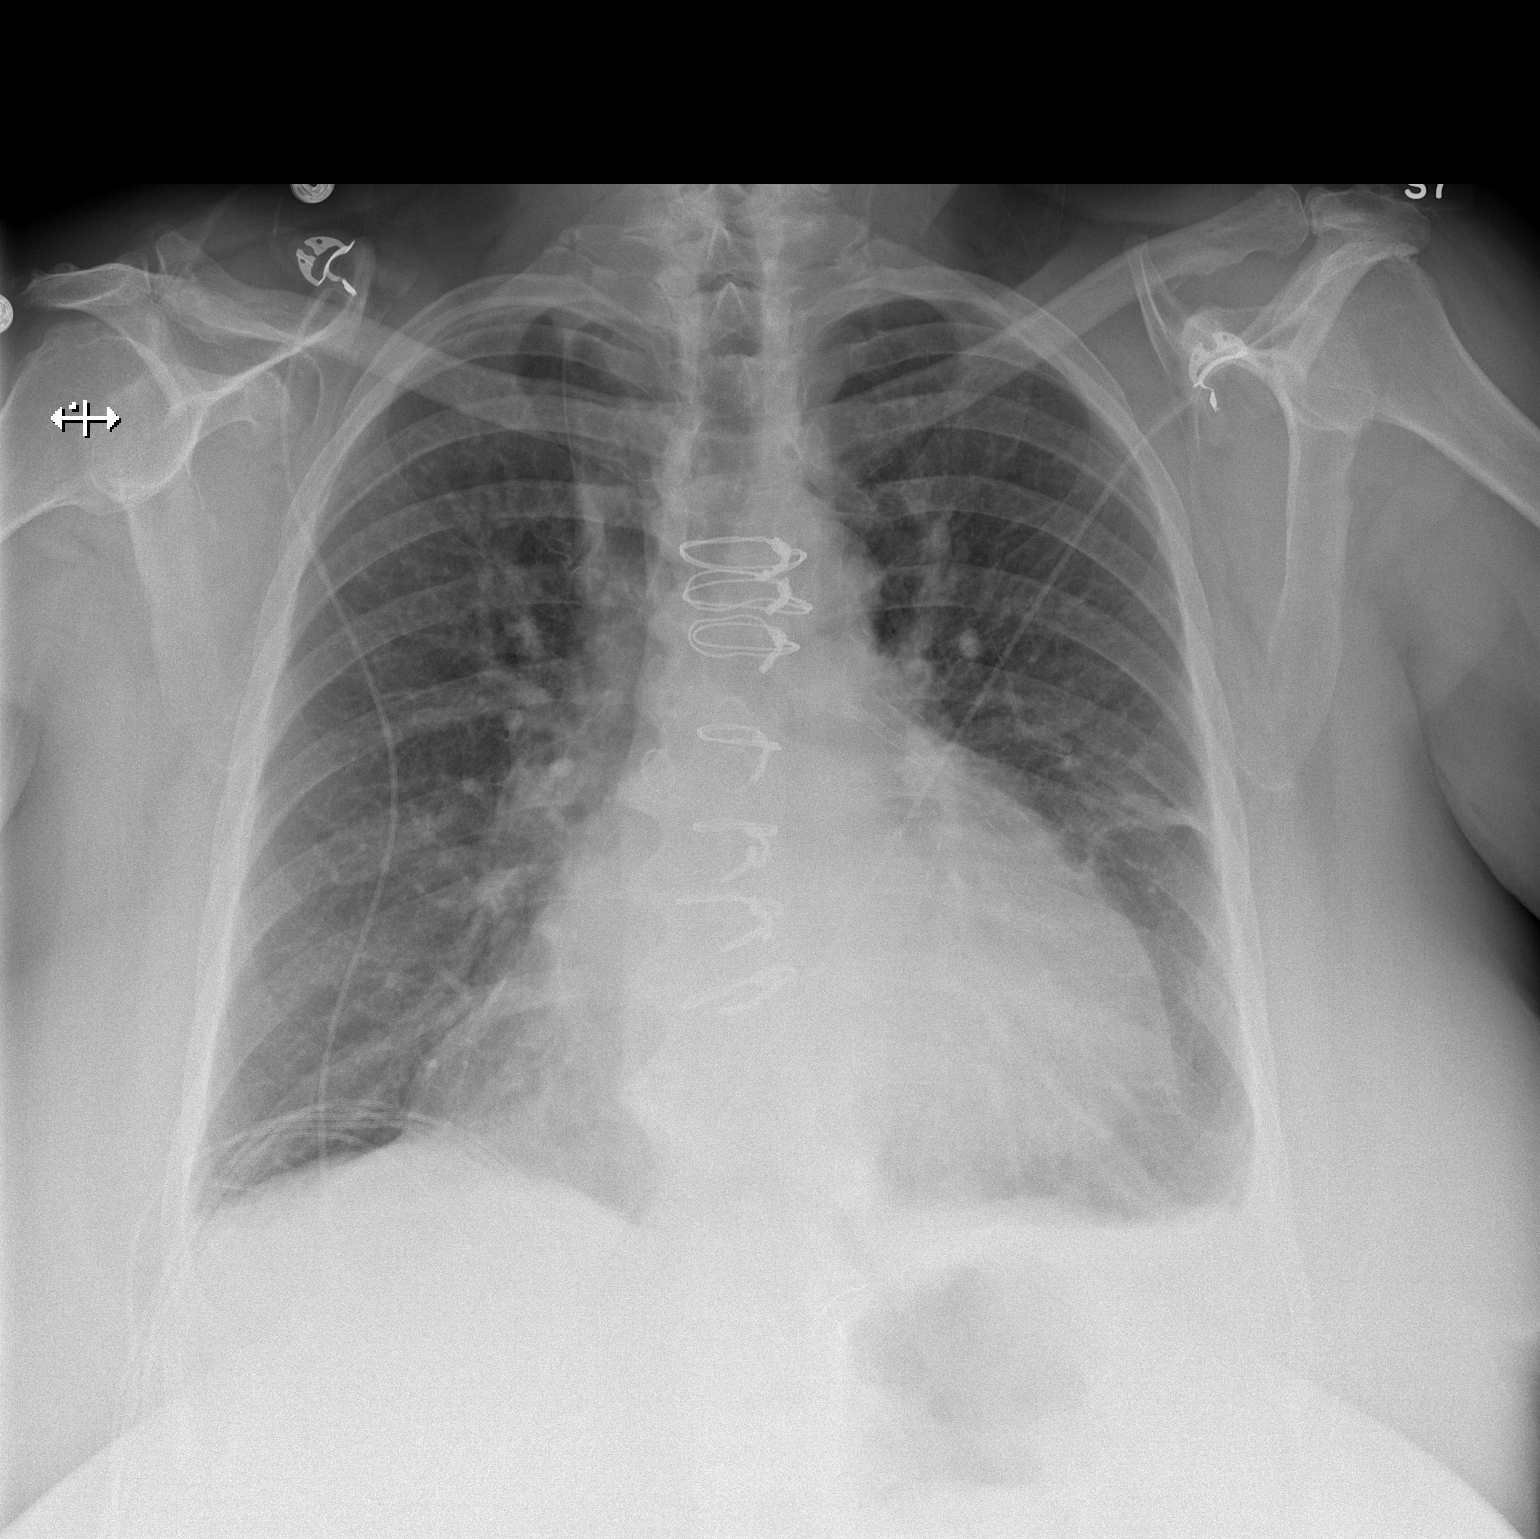

[w chest lat]
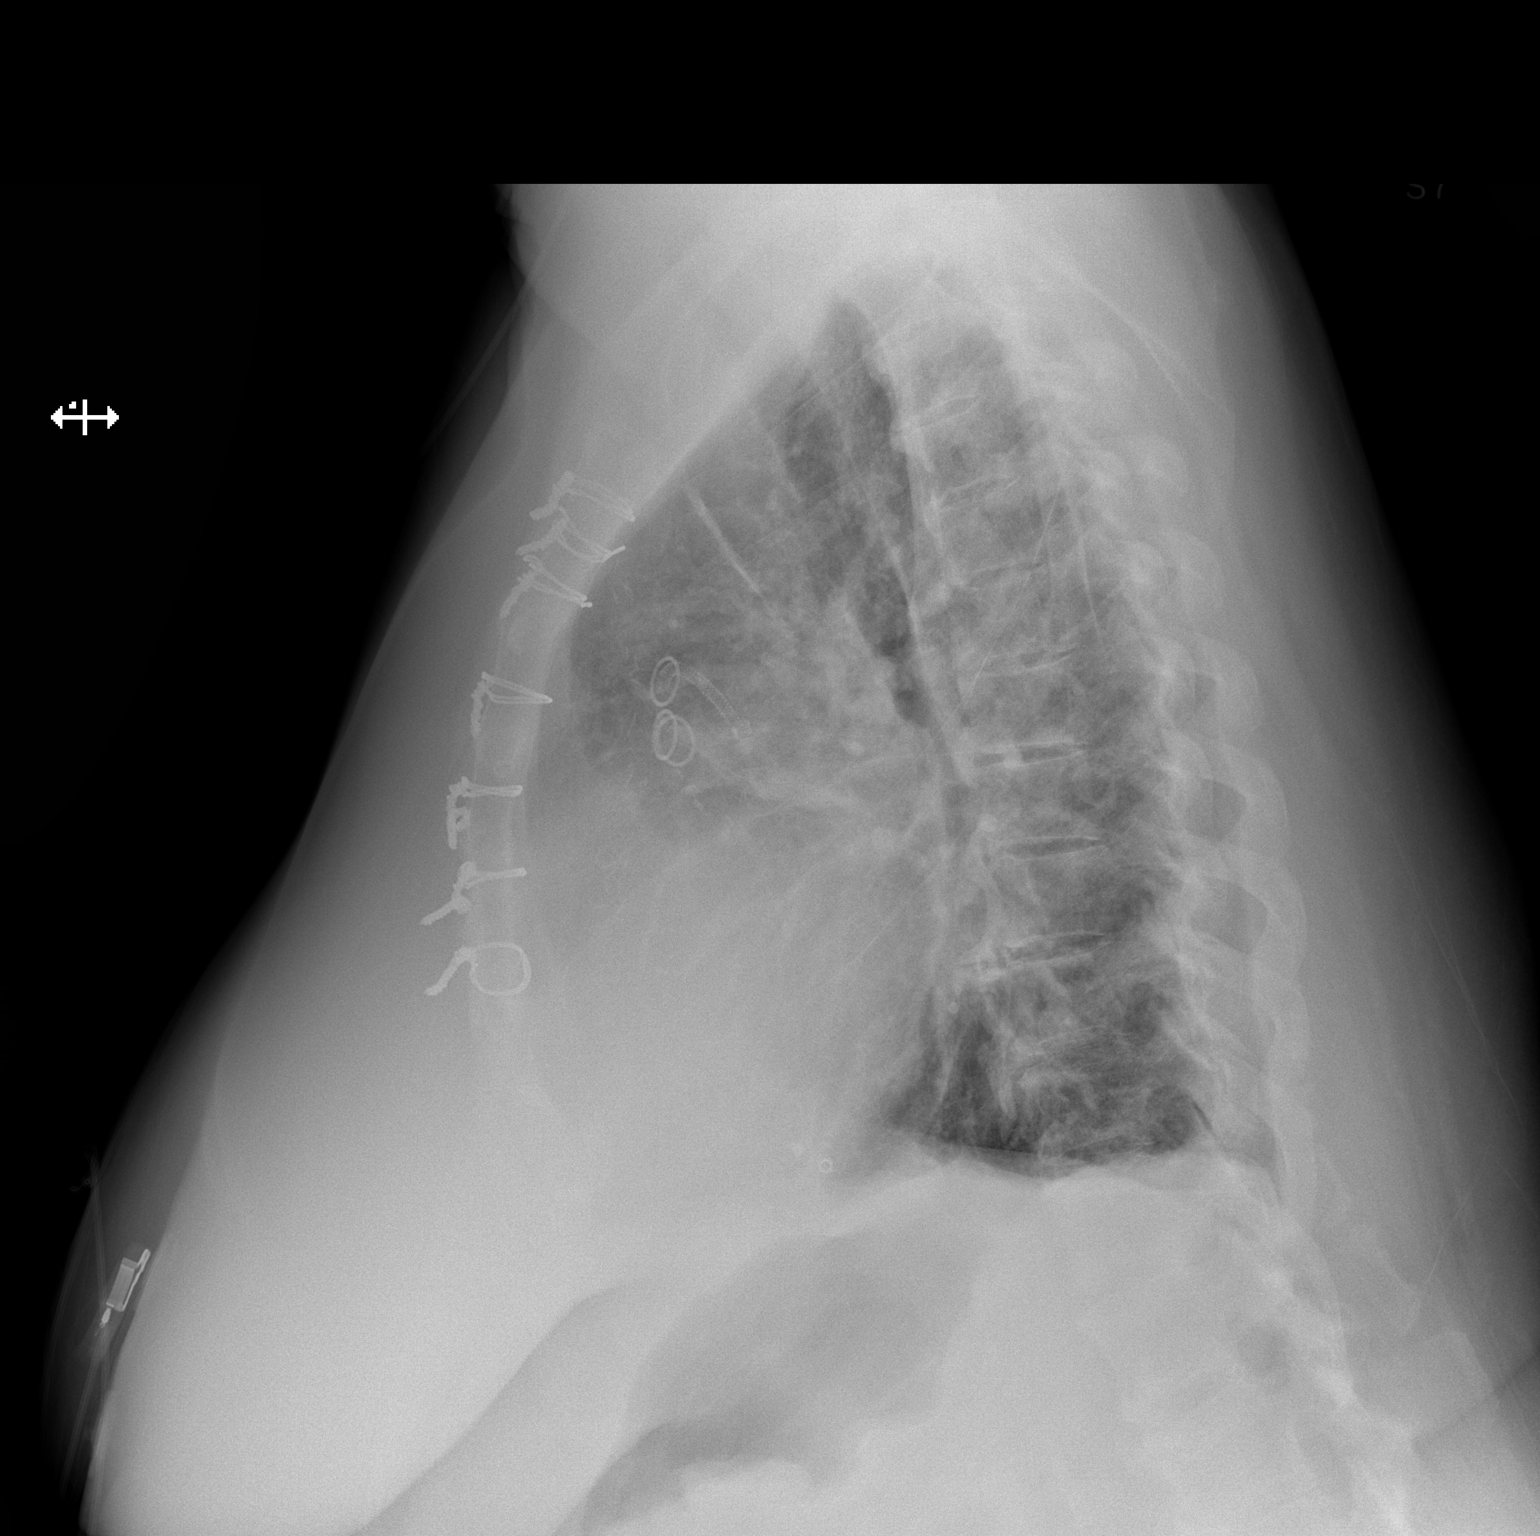

[2 of 2 positions shown; findings below may reference images not displayed]

FINDINGS: The heart is enlarged. Pulmonary vascular congestion has increased.
Linear atelectasis or scarring is present in the left lung. Small
bilateral effusions have developed.

Median sternotomy for CABG is present. There is a stent within 1 of
the vein grafts.

The visualized soft tissues and bony thorax are unremarkable.
IMPRESSION: 1. Cardiomegaly with increasing pulmonary vascular congestion and
bilateral effusions suggesting congestive heart failure.
2. Linear atelectasis or scarring in the left mid lung is stable.

## 2018-06-22 ENCOUNTER — Encounter: Payer: Self-pay | Admitting: Gastroenterology

## 2019-10-18 ENCOUNTER — Encounter: Payer: Self-pay | Admitting: Family Medicine
